# Patient Record
Sex: Male | Born: 1940 | Race: White | Hispanic: No | Marital: Married | Smoking: Never smoker
Health system: Southern US, Community
[De-identification: ages and names within clinical notes are randomized; demographics above are authoritative.]

## PROBLEM LIST (undated history)

## (undated) DIAGNOSIS — E119 Type 2 diabetes mellitus without complications: Secondary | ICD-10-CM

## (undated) DIAGNOSIS — G2581 Restless legs syndrome: Secondary | ICD-10-CM

## (undated) DIAGNOSIS — G4730 Sleep apnea, unspecified: Secondary | ICD-10-CM

## (undated) DIAGNOSIS — R600 Localized edema: Secondary | ICD-10-CM

## (undated) DIAGNOSIS — C61 Malignant neoplasm of prostate: Secondary | ICD-10-CM

## (undated) DIAGNOSIS — Z8719 Personal history of other diseases of the digestive system: Secondary | ICD-10-CM

## (undated) DIAGNOSIS — I4891 Unspecified atrial fibrillation: Secondary | ICD-10-CM

## (undated) DIAGNOSIS — K219 Gastro-esophageal reflux disease without esophagitis: Secondary | ICD-10-CM

## (undated) DIAGNOSIS — T4145XA Adverse effect of unspecified anesthetic, initial encounter: Secondary | ICD-10-CM

## (undated) DIAGNOSIS — T8859XA Other complications of anesthesia, initial encounter: Secondary | ICD-10-CM

## (undated) DIAGNOSIS — R519 Headache, unspecified: Secondary | ICD-10-CM

## (undated) DIAGNOSIS — I499 Cardiac arrhythmia, unspecified: Secondary | ICD-10-CM

## (undated) DIAGNOSIS — I10 Essential (primary) hypertension: Secondary | ICD-10-CM

## (undated) DIAGNOSIS — M75100 Unspecified rotator cuff tear or rupture of unspecified shoulder, not specified as traumatic: Secondary | ICD-10-CM

## (undated) DIAGNOSIS — M1990 Unspecified osteoarthritis, unspecified site: Secondary | ICD-10-CM

## (undated) DIAGNOSIS — R51 Headache: Secondary | ICD-10-CM

## (undated) HISTORY — PX: NECK SURGERY: SHX720

## (undated) HISTORY — PX: JOINT REPLACEMENT: SHX530

## (undated) HISTORY — PX: PROSTATE SURGERY: SHX751

## (undated) HISTORY — PX: KNEE SURGERY: SHX244

## (undated) HISTORY — PX: ROTATOR CUFF REPAIR: SHX139

## (undated) HISTORY — PX: MANDIBLE FRACTURE SURGERY: SHX706

---

## 2003-09-22 IMAGING — CR DG CHEST 2V
2 series · 2 of 2 positions shown · non-contrast
Comparison: none

CLINICAL DATA: Preadmission respiratory evaluation.  Patient for cervical surgery.
 CHEST, TWO VIEWS
 PA and lateral views of the chest dated [DATE] are reviewed without prior films for comparison.  The heart is upper limits of normal in size. The pulmonary vascularity is within normal limits and the lung fields are clear.  Eventration of the right hemidiaphragm is noted. 
 IMPRESSION
 Negative chest for active disease.

[view not recorded (1 of 2)]
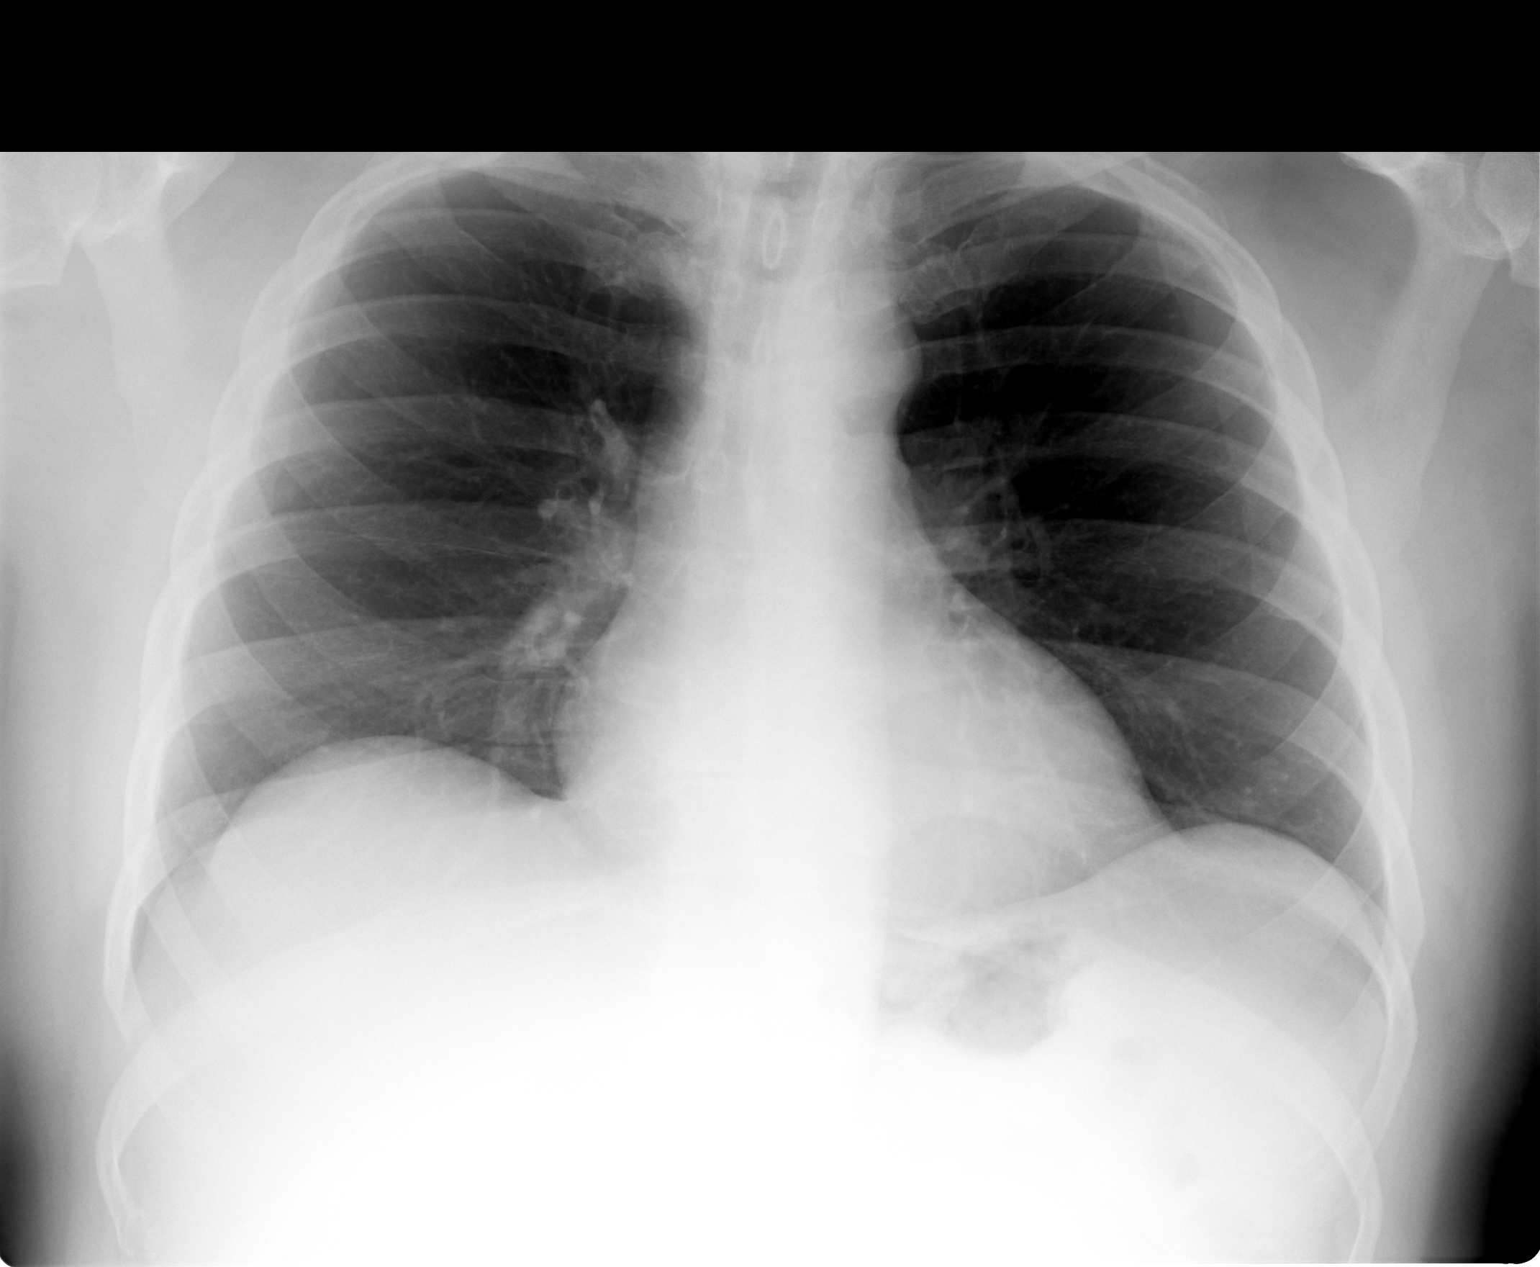

[view not recorded (2 of 2)]
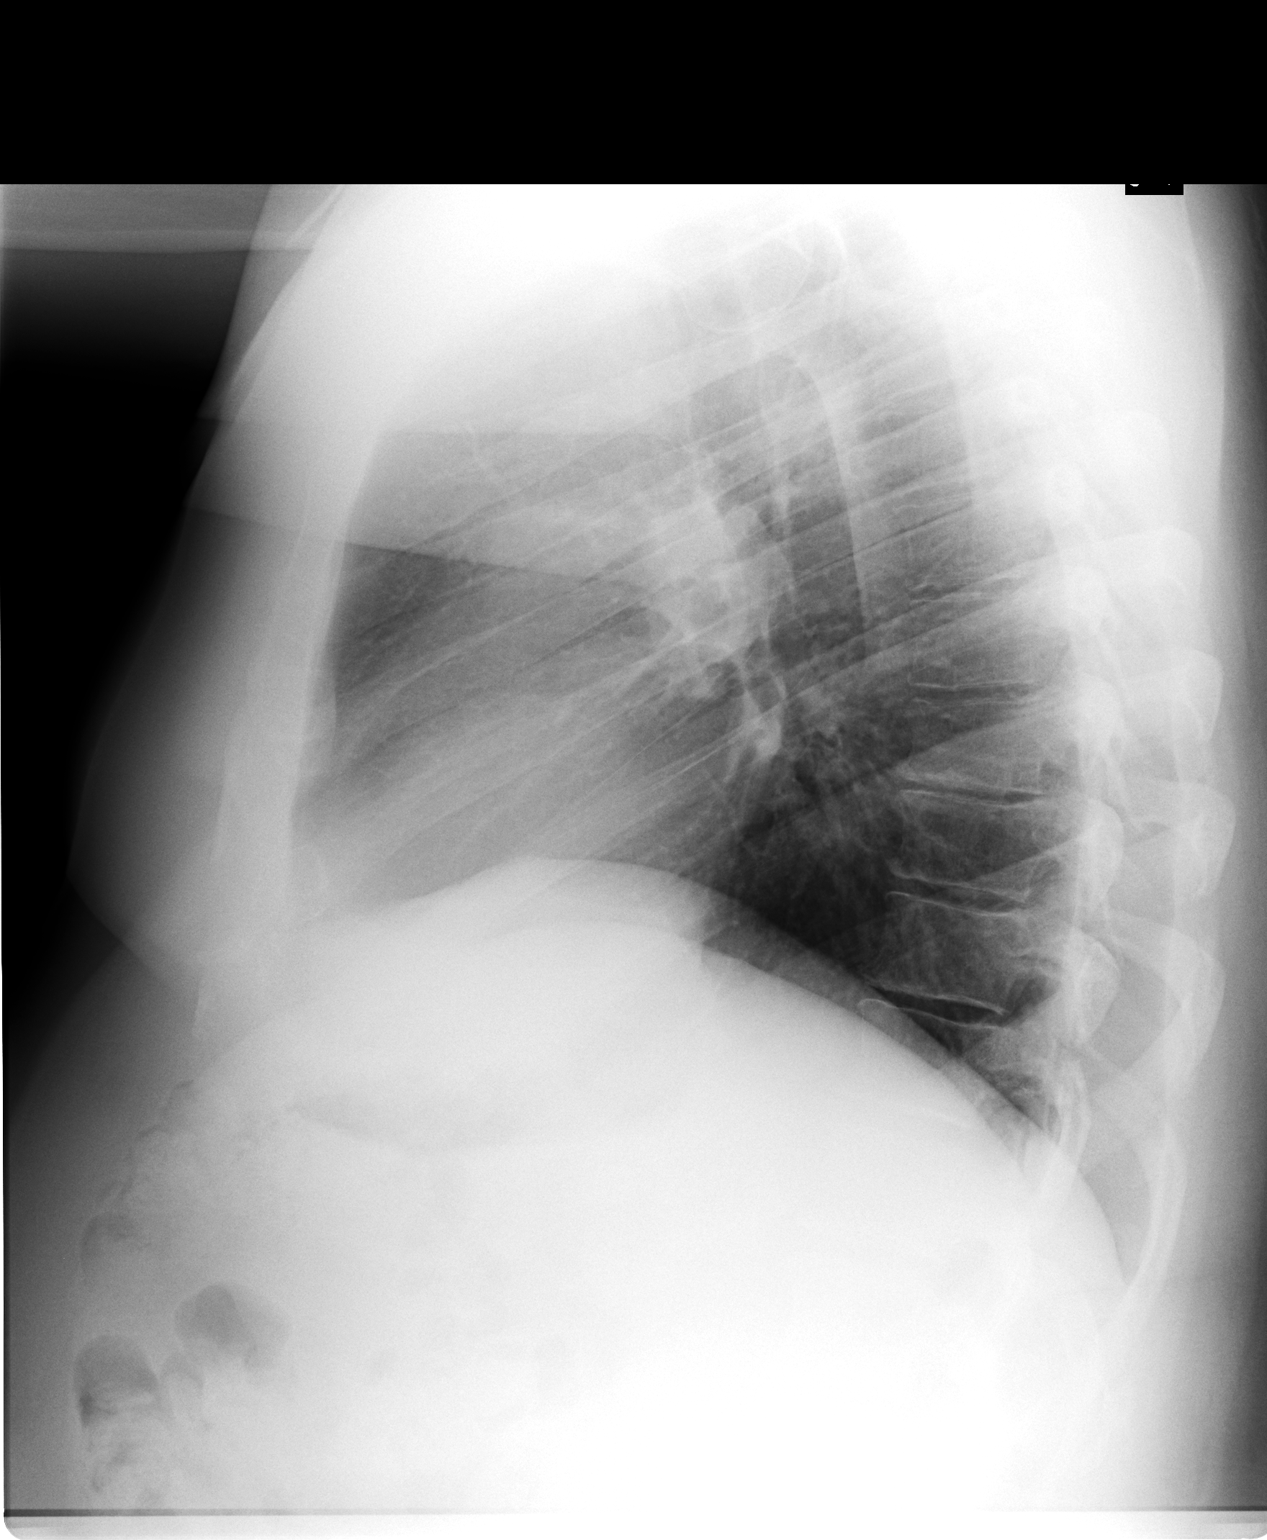

[2 of 2 positions shown; findings below may reference images not displayed]

## 2003-09-24 ENCOUNTER — Inpatient Hospital Stay (HOSPITAL_COMMUNITY): Admission: RE | Admit: 2003-09-24 | Discharge: 2003-09-25 | Payer: Self-pay | Admitting: Orthopaedic Surgery

## 2003-09-25 IMAGING — CR DG CERVICAL SPINE 2 OR 3 VIEWS
2 series · 2 of 2 positions shown · non-contrast
Comparison: Intraoperative films dated [DATE].

CLINICAL DATA: Status-post cervical discectomy and fusion. 
 TWO VIEW CERVICAL SPINE FILMS

[view not recorded (1 of 2)]
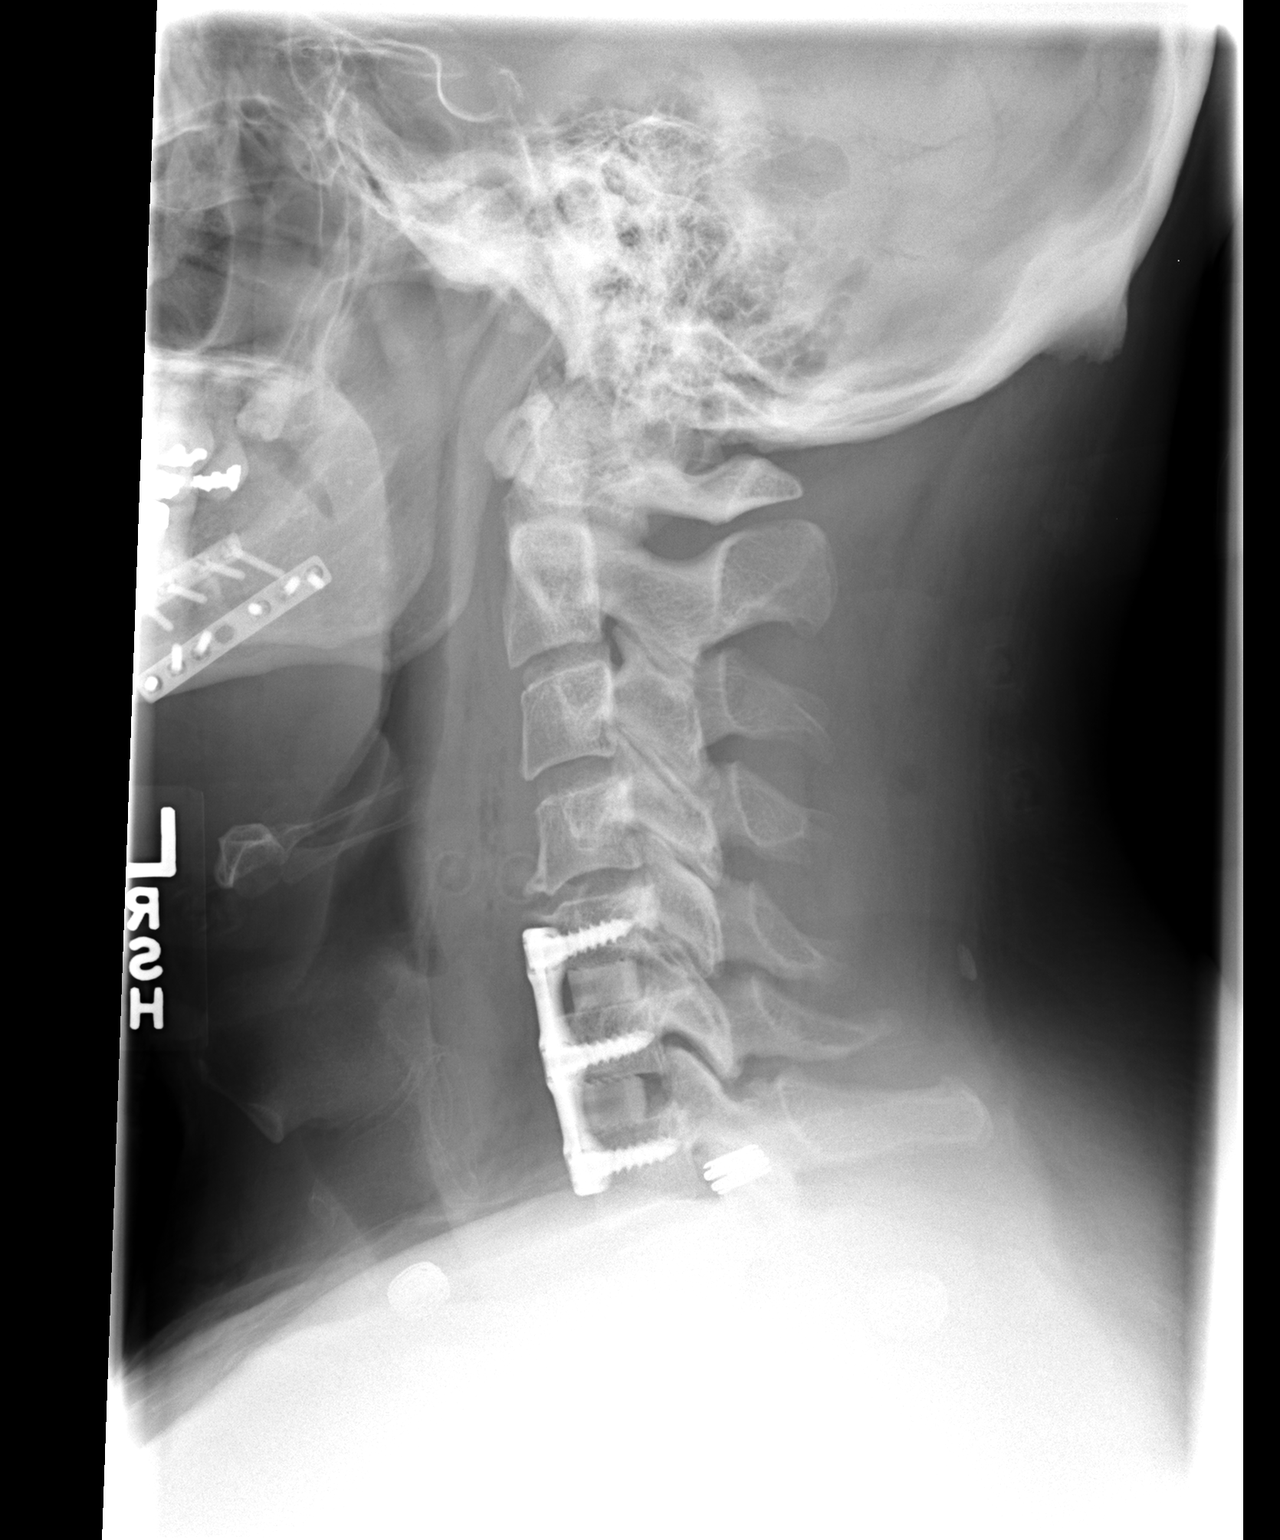

[view not recorded (2 of 2)]
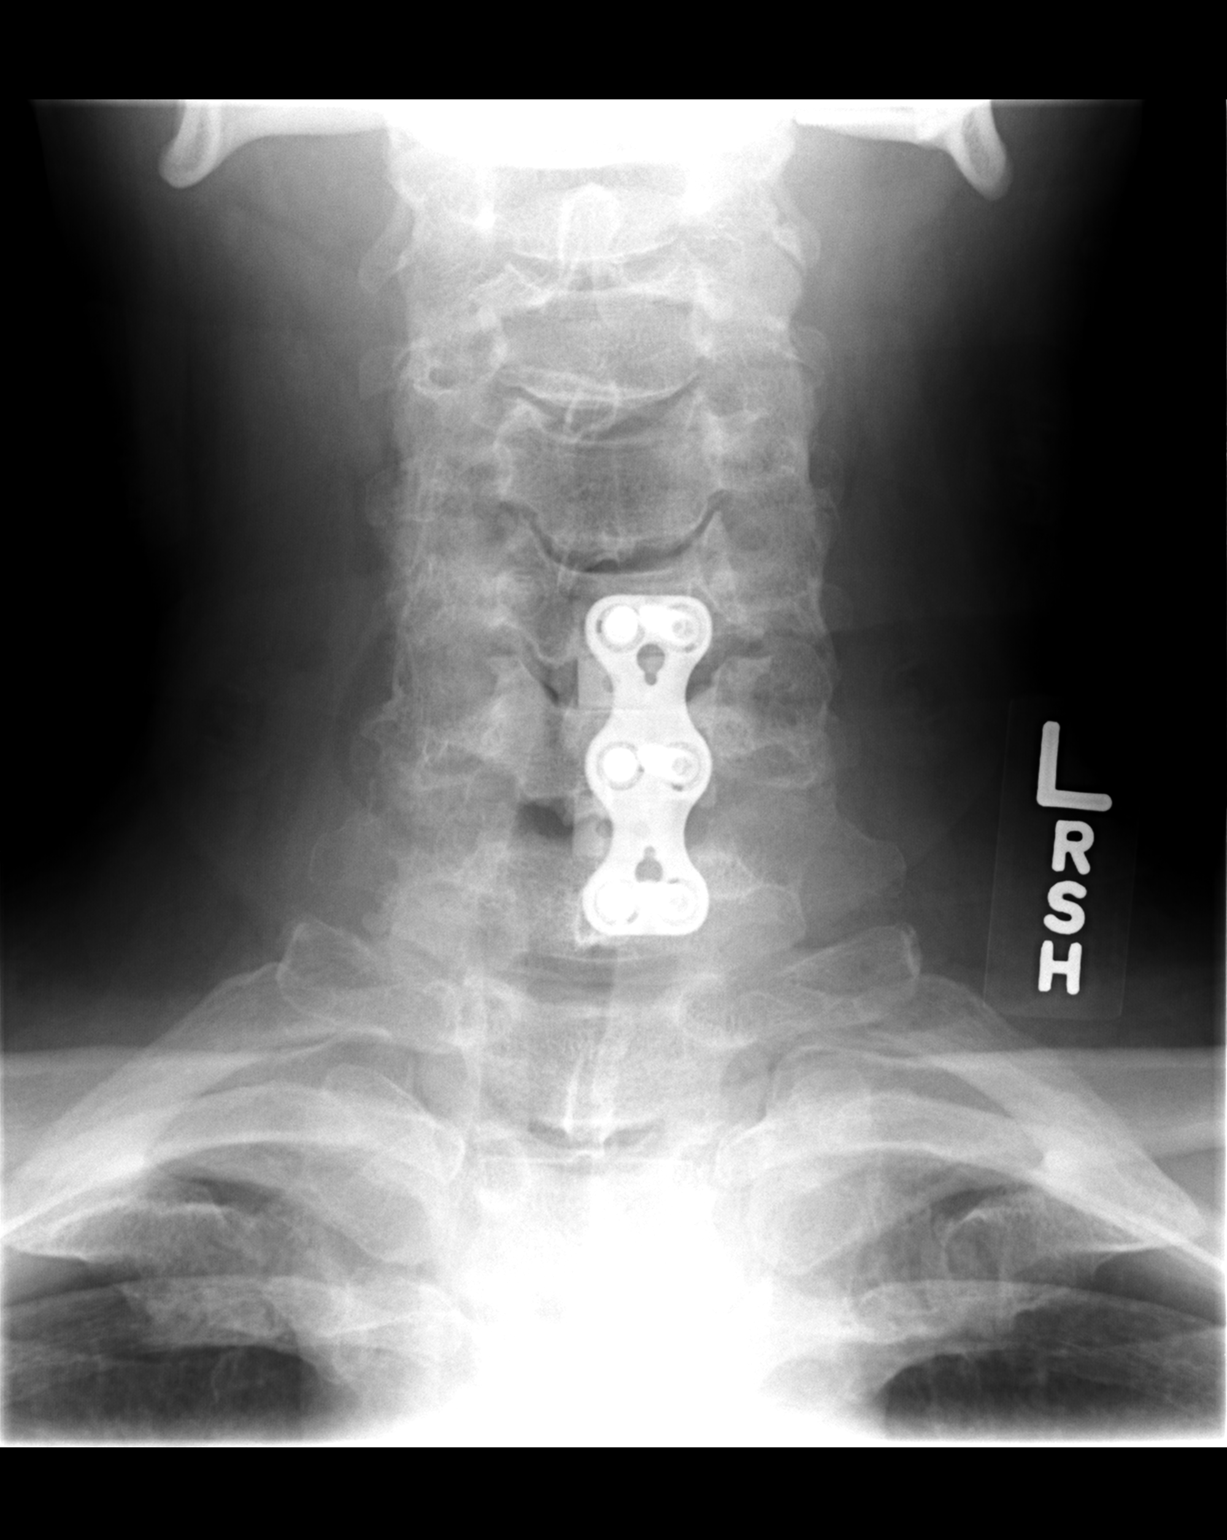

[2 of 2 positions shown; findings below may reference images not displayed]

Frontal and lateral projections show the patient to be status-post anterior cervical discectomy and fusion spanning from the C-5 to C-7 levels. Alignment of the fusion hardware and intervening bony allograft is near anatomic.  Mild anterior soft tissue swelling is present post-operatively.
 IMPRESSION
 Normal alignment following ACDF from C-5 to C-7.

## 2005-07-18 DIAGNOSIS — C61 Malignant neoplasm of prostate: Secondary | ICD-10-CM

## 2005-07-18 HISTORY — DX: Malignant neoplasm of prostate: C61

## 2006-05-04 ENCOUNTER — Encounter
Admission: RE | Admit: 2006-05-04 | Discharge: 2006-05-04 | Payer: Self-pay | Admitting: Physical Medicine and Rehabilitation

## 2006-08-22 ENCOUNTER — Encounter: Admission: RE | Admit: 2006-08-22 | Discharge: 2006-11-20 | Payer: Self-pay | Admitting: Orthopedic Surgery

## 2007-07-19 HISTORY — PX: OTHER SURGICAL HISTORY: SHX169

## 2009-11-16 ENCOUNTER — Inpatient Hospital Stay (HOSPITAL_COMMUNITY): Admission: RE | Admit: 2009-11-16 | Discharge: 2009-11-19 | Payer: Self-pay | Admitting: Orthopedic Surgery

## 2009-11-16 IMAGING — CR DG KNEE 1-2V PORT*L*
2 series · 2 of 2 positions shown · non-contrast
Comparison: None.

CLINICAL DATA: Postop left total knee replacement

PORTABLE LEFT KNEE - 1-2 VIEW

[view not recorded (1 of 2)]
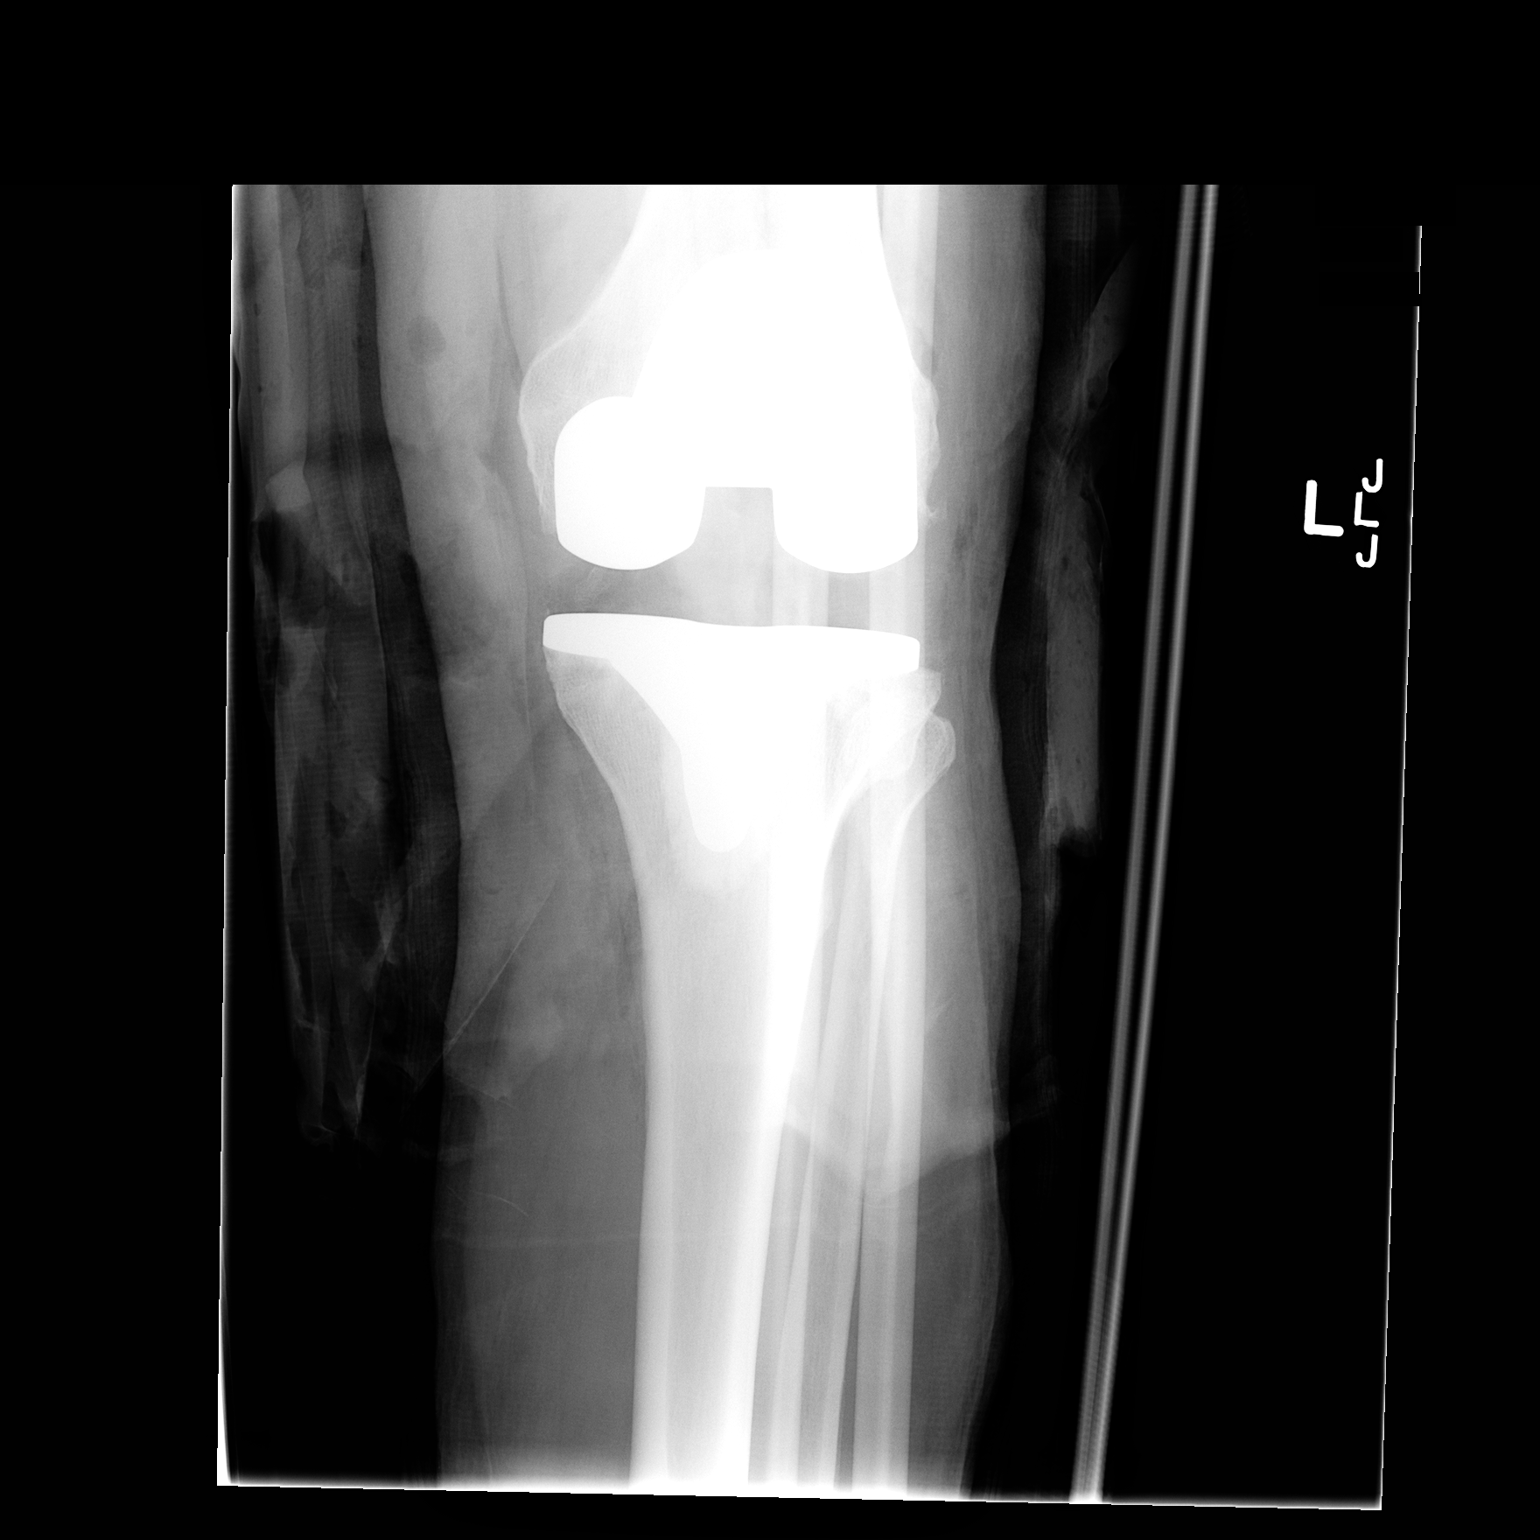

[view not recorded (2 of 2)]
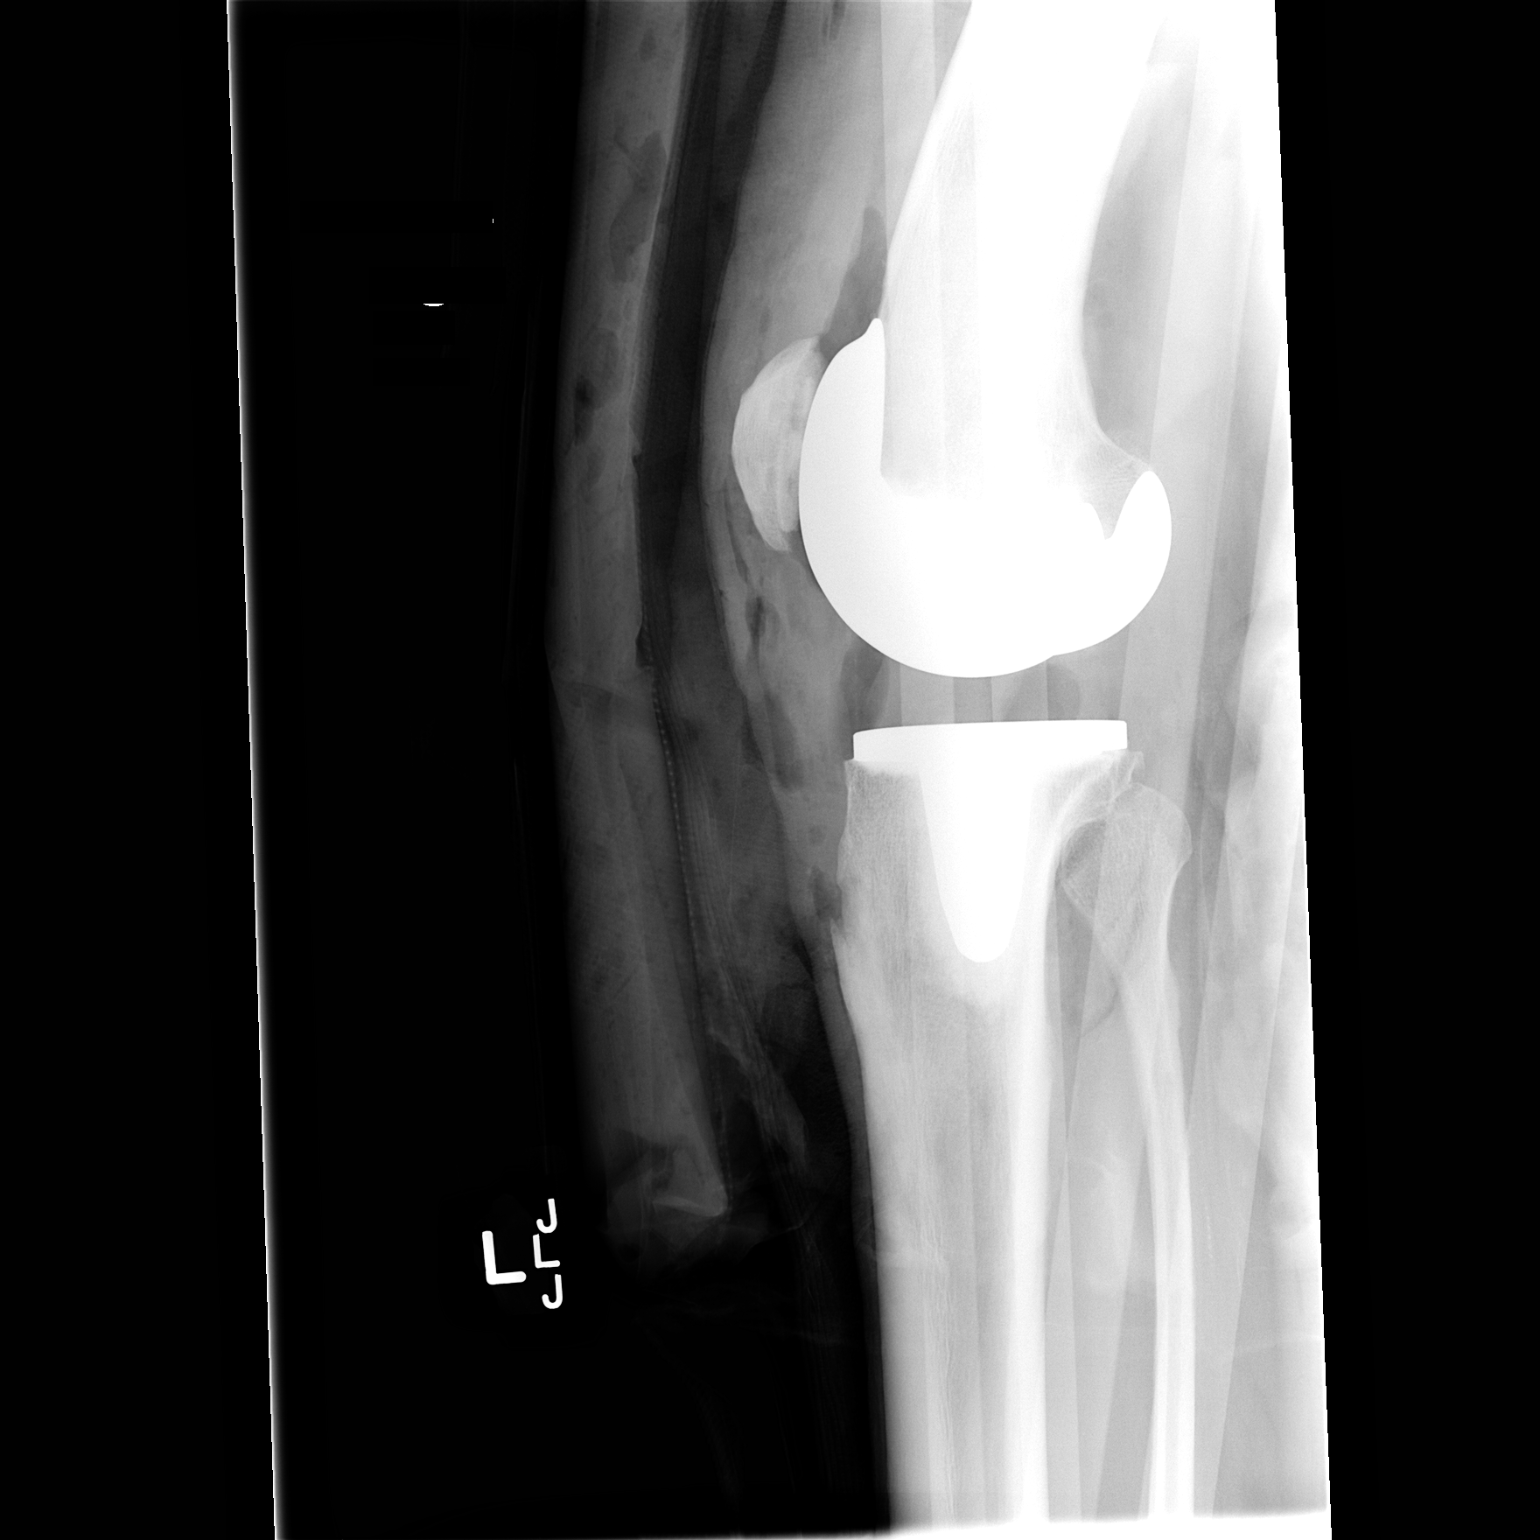

[2 of 2 positions shown; findings below may reference images not displayed]

FINDINGS: Two portable views of the left knee show the femoral and
tibial components of the total knee replacement to be in good
position and alignment.  Some air is noted in the soft tissues and
in the joint space postoperatively.
IMPRESSION: Left total knee replacement in good position and alignment on
portable views.

## 2010-10-05 LAB — CBC
HCT: 33.1 % — ABNORMAL LOW (ref 39.0–52.0)
HCT: 36.2 % — ABNORMAL LOW (ref 39.0–52.0)
Hemoglobin: 10.3 g/dL — ABNORMAL LOW (ref 13.0–17.0)
Hemoglobin: 11.4 g/dL — ABNORMAL LOW (ref 13.0–17.0)
Hemoglobin: 12.8 g/dL — ABNORMAL LOW (ref 13.0–17.0)
MCHC: 34.4 g/dL (ref 30.0–36.0)
MCV: 95.2 fL (ref 78.0–100.0)
Platelets: 165 10*3/uL (ref 150–400)
Platelets: 210 10*3/uL (ref 150–400)
RBC: 3.8 MIL/uL — ABNORMAL LOW (ref 4.22–5.81)
RBC: 4.51 MIL/uL (ref 4.22–5.81)
RDW: 13.2 % (ref 11.5–15.5)
RDW: 13.5 % (ref 11.5–15.5)
WBC: 15.9 10*3/uL — ABNORMAL HIGH (ref 4.0–10.5)
WBC: 7.9 10*3/uL (ref 4.0–10.5)

## 2010-10-05 LAB — BASIC METABOLIC PANEL
BUN: 12 mg/dL (ref 6–23)
BUN: 14 mg/dL (ref 6–23)
CO2: 28 mEq/L (ref 19–32)
Calcium: 7.9 mg/dL — ABNORMAL LOW (ref 8.4–10.5)
Calcium: 8.6 mg/dL (ref 8.4–10.5)
Chloride: 103 mEq/L (ref 96–112)
Chloride: 96 mEq/L (ref 96–112)
Chloride: 106 mEq/L (ref 96–112)
Creatinine, Ser: 0.84 mg/dL (ref 0.4–1.5)
Creatinine, Ser: 1.2 mg/dL (ref 0.4–1.5)
GFR calc Af Amer: >60 mL/min (ref >60)
GFR calc Af Amer: >60 mL/min (ref >60)
GFR calc non Af Amer: >60 mL/min (ref >60)
GFR calc non Af Amer: >60 mL/min (ref >60)
Glucose, Bld: 135 mg/dL — ABNORMAL HIGH (ref 70–99)
Glucose, Bld: 156 mg/dL — ABNORMAL HIGH (ref 70–99)
Potassium: 4.1 mEq/L (ref 3.5–5.1)
Potassium: 4.3 mEq/L (ref 3.5–5.1)
Sodium: 130 mEq/L — ABNORMAL LOW (ref 135–145)
Sodium: 134 mEq/L — ABNORMAL LOW (ref 135–145)

## 2010-10-05 LAB — URINALYSIS, ROUTINE W REFLEX MICROSCOPIC
Bilirubin Urine: NEGATIVE
Bilirubin Urine: NEGATIVE
Glucose, UA: 250 mg/dL — AB
Glucose, UA: NEGATIVE mg/dL
Hgb urine dipstick: NEGATIVE
Ketones, ur: NEGATIVE mg/dL
Leukocytes, UA: NEGATIVE
Specific Gravity, Urine: 1.03 (ref 1.005–1.030)
Specific Gravity, Urine: 1.015 (ref 1.005–1.030)
pH: 6.5 (ref 5.0–8.0)
pH: 6 (ref 5.0–8.0)

## 2010-10-05 LAB — APTT: aPTT: 28 seconds (ref 24–37)

## 2010-10-05 LAB — URINE MICROSCOPIC-ADD ON

## 2010-10-05 LAB — DIFFERENTIAL
Lymphocytes Relative: 6 % — ABNORMAL LOW (ref 12–46)
Lymphs Abs: 0.5 10*3/uL — ABNORMAL LOW (ref 0.7–4.0)
Monocytes Relative: 2 % — ABNORMAL LOW (ref 3–12)
Neutro Abs: 7.3 10*3/uL (ref 1.7–7.7)
Neutrophils Relative %: 92 % — ABNORMAL HIGH (ref 43–77)

## 2010-10-05 LAB — PROTIME-INR
INR: 3.51 — ABNORMAL HIGH (ref 0.00–1.49)
INR: 1.03 (ref 0.00–1.49)
Prothrombin Time: 34.9 seconds — ABNORMAL HIGH (ref 11.6–15.2)
Prothrombin Time: 13.4 seconds (ref 11.6–15.2)

## 2010-10-05 LAB — ABO/RH: ABO/RH(D): A POS

## 2010-12-03 NOTE — Op Note (Signed)
NAME:  Manuel Hunter, Manuel Hunter NO.:  [id number]   MEDICAL RECORD NO.:  [id number]                   PATIENT TYPE:  INP   LOCATION:  5727                                 FACILITY:  [facility]   PHYSICIAN:  Sharolyn Douglas, M.D.                     DATE OF BIRTH:  Aug 07, 1940   DATE OF PROCEDURE:  09/24/2003  DATE OF DISCHARGE:                                 OPERATIVE REPORT   PREOPERATIVE DIAGNOSIS:  Cervical spondylotic radiculopathy.   POSTOPERATIVE DIAGNOSIS:  Cervical spondylotic radiculopathy.   PROCEDURE:  1. Anterior cervical discectomy C5-C6 and C6-C7.  2. Anterior cervical arthrodesis C5-C6 and C6-C7 with placement of two     allograft prosthesis spacers packed with local autogenous bone graft.  3. Anterior cervical plating C5 through C7 utilizing the Spinal Concepts     System.   SURGEON:  Sharolyn Douglas, M.D.   ASSISTANT:  Verlin Fester, P.A.   ANESTHESIA:  General endotracheal anesthesia.   COMPLICATIONS:  None.   INDICATIONS FOR PROCEDURE:  The patient is a 70 year old male who has a long  history of neck stiffness and intermittent pain.  Two months ago, he had a  fall.  He developed radicular pain of his left upper extremity parascapular  area with numbness, tingling and weakness.  Plain radiographs showed  degenerative changes at C4-C5, C5-C6, and C6-C7.  MRI scan demonstrated  foraminal narrowing at C5-C6 and C6-C7 secondary to broad based disc  protrusions as well as osteophytes.  Because of the symptoms unresponsive to  conservative care, he elected to undergo decompression with anterior  cervical discectomy and fusion at C5-C6 and C6-C7.  The risks, benefits, and  alternatives were extensively discussed and the patient elected to proceed.   PROCEDURE:  The patient was properly identified in the holding area and  taken to the operating room.  He underwent general endotracheal anesthesia  without difficulty.  He was given prophylactic IV antibiotics.   He was  carefully positioned on the operating table with a Mayfield headrest.  His  neck was placed in slight extension.  The neck was prepped and draped in the  usual sterile fashion.  A 4 cm incision was made on the left side of the  neck in a transverse fashion at the level of the cricoid cartilage.  Dissection was carried sharply through the platysma.  The interval between  the sternocleidomastoid muscle and the strap muscles medially was developed  down to the prevertebral space.  The esophagus, trachea, and carotid sheath  were identified and protected at all times.  The C4-C5 and C5-C6 disc spaces  were easily identifiable by the anterior osteophytes.  A spinal needle was  placed in the C6-C7 space and an x-ray taken to confirm this location.  The  longus colli muscle was then elevated bilaterally out over the C5-C6 and C6-  C7 disc spaces.  Deep retractor  was placed.  The microscope was draped and  brought into the field.  Over hanging anterior osteophytes were removed with  the Leksell rongeur.  Discectomies were carried out at C5-C6 and C6-C7.  The  disc space was severely degenerative and collapsed at C5-C6.  Caspar  distraction pins were placed in order to gain access to the interspace.  A  high speed bur was used to remove the cartilaginous endplates and take down  the large uncovertebral osteophytes.  Foraminotomy was carried out  bilaterally.  The posterior longitudinal ligament was taken down completely  decompressing the spinal cord and nerve roots.  A similar procedure was  carried out at C6-C7.  We then placed 8 mm allograft prosthesis spacer  packed with local autogenous bone graft obtained from the bur shavings into  the C6-C7 interspace.  It was carefully counter sunk 1 mm.  A 7 mm graft was  utilized at C5-C6.  We then placed a 42 mm Spinal Concepts anterior cervical  plate with six 14 mm screws.  We got excellent screw purchase.  We insured  that the locking  mechanism engaged for each screw.  The wound was irrigated.  The esophagus, trachea, and carotid sheath were inspected and there were no  apparent injuries.  We placed a deep Penrose drain.  The platysma was closed  with an interrupted 2-0 Vicryl suture.  The subcutaneous layer closed with a  3-0 Vicryl followed by a 4-0 subcuticular Vicryl suture on the skin.  Benzoin and Steri-Strips were placed.  A sterile dressing was placed.  The  patient was placed into his Aspen cervical collar, extubated without  difficulty, transferred to the recovery room, able to move his upper and  lower extremities, in good condition.                                               Sharolyn Douglas, M.D.    MC/MEDQ  D:  09/25/2003  T:  09/25/2003  Job:  [id number]

## 2011-10-27 ENCOUNTER — Other Ambulatory Visit (HOSPITAL_COMMUNITY): Payer: Self-pay | Admitting: Orthopedic Surgery

## 2011-10-27 DIAGNOSIS — M25562 Pain in left knee: Secondary | ICD-10-CM

## 2011-11-04 ENCOUNTER — Encounter (HOSPITAL_COMMUNITY): Payer: Self-pay

## 2011-11-04 ENCOUNTER — Other Ambulatory Visit (HOSPITAL_COMMUNITY): Payer: Self-pay

## 2011-11-11 ENCOUNTER — Encounter (HOSPITAL_COMMUNITY): Payer: Self-pay

## 2011-11-11 ENCOUNTER — Encounter (HOSPITAL_COMMUNITY)
Admission: RE | Admit: 2011-11-11 | Discharge: 2011-11-11 | Disposition: A | Discharge status: Home / Self Care | Payer: Medicare Other | Source: Ambulatory Visit | Attending: Orthopedic Surgery | Admitting: Orthopedic Surgery

## 2011-11-11 DIAGNOSIS — M25562 Pain in left knee: Secondary | ICD-10-CM

## 2011-11-11 DIAGNOSIS — M25469 Effusion, unspecified knee: Secondary | ICD-10-CM | POA: Insufficient documentation

## 2011-11-11 DIAGNOSIS — Z96659 Presence of unspecified artificial knee joint: Secondary | ICD-10-CM | POA: Insufficient documentation

## 2011-11-11 DIAGNOSIS — M25569 Pain in unspecified knee: Secondary | ICD-10-CM | POA: Insufficient documentation

## 2011-11-11 IMAGING — NM NM BONE 3 PHASE
2 series · 12 of 12 positions shown · non-contrast
Comparison: Radiographs from [REDACTED]
[DATE].

CLINICAL DATA: Left knee pain and swelling status post total knee
arthroplasty in [II].

NUCLEAR MEDICINE THREE PHASE BONE SCAN
TECHNIQUE: Radionuclide angiographic images, immediate static
blood pool images, and 3-hour delayed static images the knees were
obtained after intravenous injection of radiopharmaceutical.
Radiopharmaceutical: [II] ABRAHAMS TECHNETIUM TC 99M
MEDRONATE IV KIT

[Series 1: fl flow and static · 4.75mm/px · 6 of 40 frames shown (1 of 2)]
[frame 4/40]
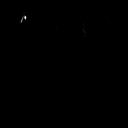
[frame 10/40  full-range]
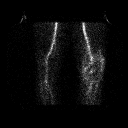
[frame 17/40  full-range]
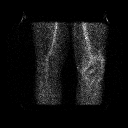
[frame 24/40  full-range]
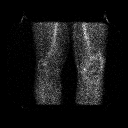
[frame 30/40  full-range]
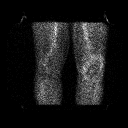
[frame 37/40  full-range]
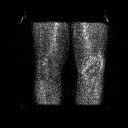

[Series 1: fl flow and static · 4.75mm/px · 6 of 40 frames shown (2 of 2)]
[frame 4/40]
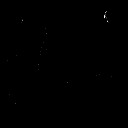
[frame 10/40  full-range]
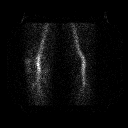
[frame 17/40  full-range]
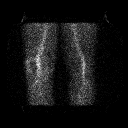
[frame 24/40  full-range]
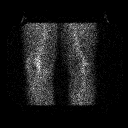
[frame 30/40  full-range]
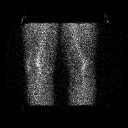
[frame 37/40  full-range]
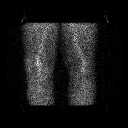

[12 of 12 positions shown; findings below may reference images not displayed]

FINDINGS: The dynamic images demonstrate minimally asymmetric
increased perfusion to the left knee.  On the blood pool images,
there is mildly increased activity surrounding the left total knee
arthroplasty, primarily the femoral component.  On the delayed
phase images, there is low-level activity surrounding the femoral,
tibial and patellar components of the left total knee arthroplasty.
There is also mild degenerative activity involving the right
patella.
IMPRESSION: 1.  Mild asymmetric increased perfusion and blood pool activity
surrounding the left total knee arthroplasty.
2.  The delayed phase activity is only mildly increased.  This
pattern is nonspecific and may represent synovitis. Early loosening
cannot be excluded.

## 2011-11-11 MED ORDER — TECHNETIUM TC 99M MEDRONATE IV KIT
24.0000 | PACK | Freq: Once | INTRAVENOUS | Status: AC | PRN
  Administered 2011-11-11: 24 via INTRAVENOUS

## 2014-06-12 ENCOUNTER — Emergency Department (HOSPITAL_BASED_OUTPATIENT_CLINIC_OR_DEPARTMENT_OTHER)
Admission: EM | Admit: 2014-06-12 | Discharge: 2014-06-12 | Disposition: A | Discharge status: Other Acute Inpatient Hospital | Payer: Medicare HMO | Attending: Emergency Medicine | Admitting: Emergency Medicine

## 2014-06-12 ENCOUNTER — Emergency Department (HOSPITAL_BASED_OUTPATIENT_CLINIC_OR_DEPARTMENT_OTHER): Payer: Medicare HMO

## 2014-06-12 ENCOUNTER — Encounter (HOSPITAL_BASED_OUTPATIENT_CLINIC_OR_DEPARTMENT_OTHER): Payer: Self-pay

## 2014-06-12 DIAGNOSIS — Z8669 Personal history of other diseases of the nervous system and sense organs: Secondary | ICD-10-CM | POA: Insufficient documentation

## 2014-06-12 DIAGNOSIS — Z79899 Other long term (current) drug therapy: Secondary | ICD-10-CM | POA: Insufficient documentation

## 2014-06-12 DIAGNOSIS — R4182 Altered mental status, unspecified: Secondary | ICD-10-CM

## 2014-06-12 DIAGNOSIS — Z8546 Personal history of malignant neoplasm of prostate: Secondary | ICD-10-CM | POA: Insufficient documentation

## 2014-06-12 DIAGNOSIS — R270 Ataxia, unspecified: Secondary | ICD-10-CM

## 2014-06-12 DIAGNOSIS — R260 Ataxic gait: Secondary | ICD-10-CM | POA: Diagnosis not present

## 2014-06-12 DIAGNOSIS — M1990 Unspecified osteoarthritis, unspecified site: Secondary | ICD-10-CM | POA: Insufficient documentation

## 2014-06-12 HISTORY — DX: Unspecified osteoarthritis, unspecified site: M19.90

## 2014-06-12 HISTORY — DX: Malignant neoplasm of prostate: C61

## 2014-06-12 HISTORY — DX: Restless legs syndrome: G25.81

## 2014-06-12 LAB — COMPREHENSIVE METABOLIC PANEL
ALBUMIN: 3.8 g/dL (ref 3.5–5.2)
ALT: 15 U/L (ref 0–53)
ANION GAP: 11 (ref 5–15)
AST: 22 U/L (ref 0–37)
Alkaline Phosphatase: 89 U/L (ref 39–117)
BILIRUBIN TOTAL: 0.2 mg/dL — AB (ref 0.3–1.2)
BUN: 20 mg/dL (ref 6–23)
CHLORIDE: 105 meq/L (ref 96–112)
CO2: 25 mEq/L (ref 19–32)
CREATININE: 1.2 mg/dL (ref 0.50–1.35)
Calcium: 9 mg/dL (ref 8.4–10.5)
GFR calc Af Amer: 67 mL/min — ABNORMAL LOW (ref >90)
GFR, EST NON AFRICAN AMERICAN: 58 mL/min — AB (ref >90)
Glucose, Bld: 167 mg/dL — ABNORMAL HIGH (ref 70–99)
Potassium: 4 mEq/L (ref 3.7–5.3)
Sodium: 141 mEq/L (ref 137–147)
Total Protein: 6.7 g/dL (ref 6.0–8.3)

## 2014-06-12 LAB — APTT: aPTT: 30 seconds (ref 24–37)

## 2014-06-12 LAB — PHENYTOIN LEVEL, TOTAL: PHENYTOIN LVL: 3.1 ug/mL — AB (ref 10.0–20.0)

## 2014-06-12 LAB — URINALYSIS, ROUTINE W REFLEX MICROSCOPIC
Bilirubin Urine: NEGATIVE
GLUCOSE, UA: NEGATIVE mg/dL
HGB URINE DIPSTICK: NEGATIVE
Ketones, ur: NEGATIVE mg/dL
Leukocytes, UA: NEGATIVE
Nitrite: NEGATIVE
Protein, ur: NEGATIVE mg/dL
SPECIFIC GRAVITY, URINE: 1.008 (ref 1.005–1.030)
Urobilinogen, UA: 0.2 mg/dL (ref 0.0–1.0)
pH: 5.5 (ref 5.0–8.0)

## 2014-06-12 LAB — CBC
HEMATOCRIT: 39.8 % (ref 39.0–52.0)
HEMOGLOBIN: 13.7 g/dL (ref 13.0–17.0)
MCH: 31.8 pg (ref 26.0–34.0)
MCHC: 34.4 g/dL (ref 30.0–36.0)
MCV: 92.3 fL (ref 78.0–100.0)
Platelets: 207 10*3/uL (ref 150–400)
RBC: 4.31 MIL/uL (ref 4.22–5.81)
RDW: 13.1 % (ref 11.5–15.5)
WBC: 6.2 10*3/uL (ref 4.0–10.5)

## 2014-06-12 LAB — PROTIME-INR
INR: 1.06 (ref 0.00–1.49)
Prothrombin Time: 13.8 seconds (ref 11.6–15.2)

## 2014-06-12 LAB — RAPID URINE DRUG SCREEN, HOSP PERFORMED
AMPHETAMINES: NOT DETECTED
Barbiturates: NOT DETECTED
Benzodiazepines: NOT DETECTED
Cocaine: NOT DETECTED
OPIATES: NOT DETECTED
Tetrahydrocannabinol: NOT DETECTED

## 2014-06-12 LAB — DIFFERENTIAL
BASOS PCT: 0 % (ref 0–1)
Basophils Absolute: 0 10*3/uL (ref 0.0–0.1)
EOS ABS: 0.3 10*3/uL (ref 0.0–0.7)
EOS PCT: 5 % (ref 0–5)
Lymphocytes Relative: 17 % (ref 12–46)
Lymphs Abs: 1.1 10*3/uL (ref 0.7–4.0)
MONO ABS: 0.6 10*3/uL (ref 0.1–1.0)
MONOS PCT: 10 % (ref 3–12)
NEUTROS ABS: 4.2 10*3/uL (ref 1.7–7.7)
Neutrophils Relative %: 68 % (ref 43–77)

## 2014-06-12 LAB — I-STAT ARTERIAL BLOOD GAS, ED
Acid-base deficit: 1 mmol/L (ref 0.0–2.0)
BICARBONATE: 25.3 meq/L — AB (ref 20.0–24.0)
O2 SAT: 93 %
PO2 ART: 69 mmHg — AB (ref 80.0–100.0)
Patient temperature: 37
TCO2: 27 mmol/L (ref 0–100)
pCO2 arterial: 45.9 mmHg — ABNORMAL HIGH (ref 35.0–45.0)
pH, Arterial: 7.349 — ABNORMAL LOW (ref 7.350–7.450)

## 2014-06-12 LAB — TROPONIN I

## 2014-06-12 LAB — ETHANOL: Alcohol, Ethyl (B): <11 mg/dL (ref 0–11)

## 2014-06-12 IMAGING — CT CT HEAD W/O CM
2 series · 16 of 30 positions shown, 18 images · non-contrast
Comparison: None.

CLINICAL DATA: Altered mental status and dizziness

EXAM:
CT HEAD WITHOUT CONTRAST
TECHNIQUE: Contiguous axial images were obtained from the base of the skull
through the vertex without intravenous contrast.

[Series 2: head 4.8 h37s · axial · 0.44mm/px · z∈[-130,+3]mm · 8 of 36 slices shown, 10 images]
[im 4/36  brain]
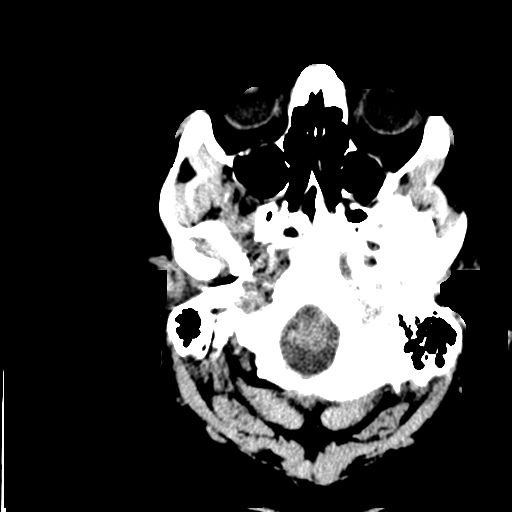
[im 4/36  bone]
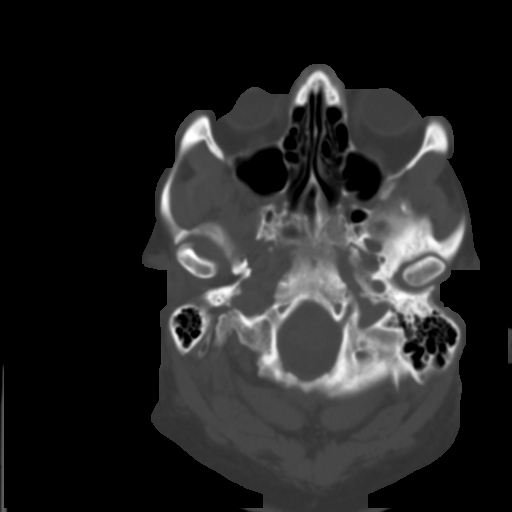
[im 8/36  brain]
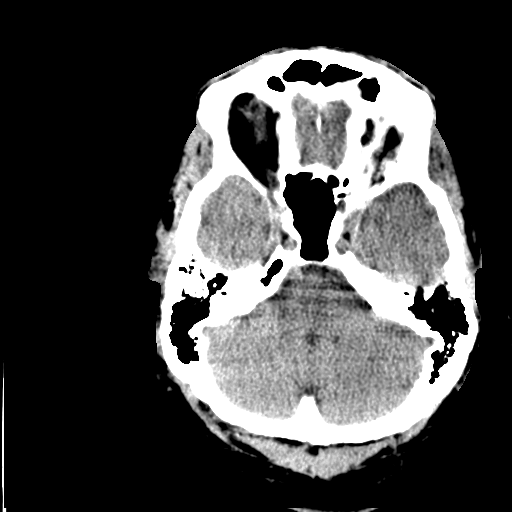
[im 12/36  brain]
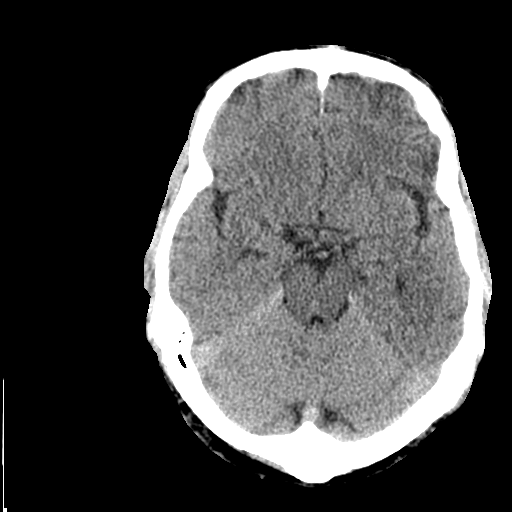
[im 16/36  brain]
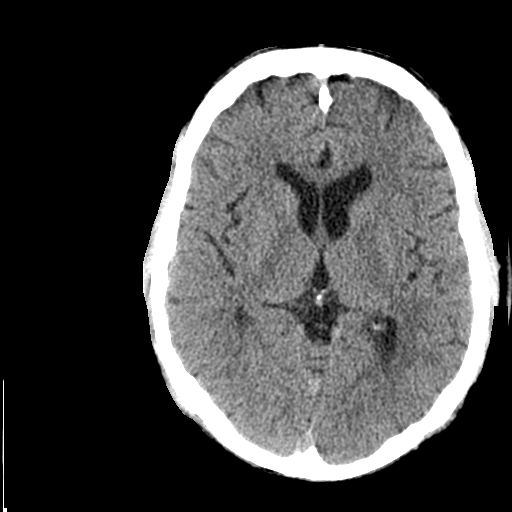
[im 20/36  brain]
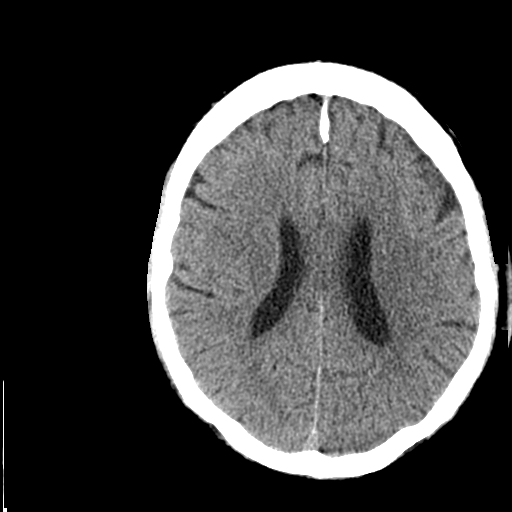
[im 20/36  bone]
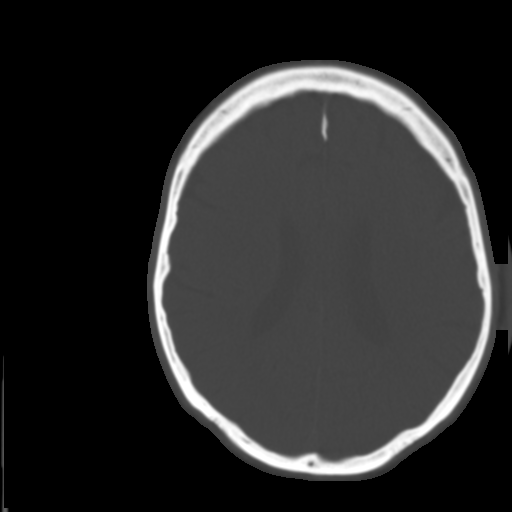
[im 24/36  brain]
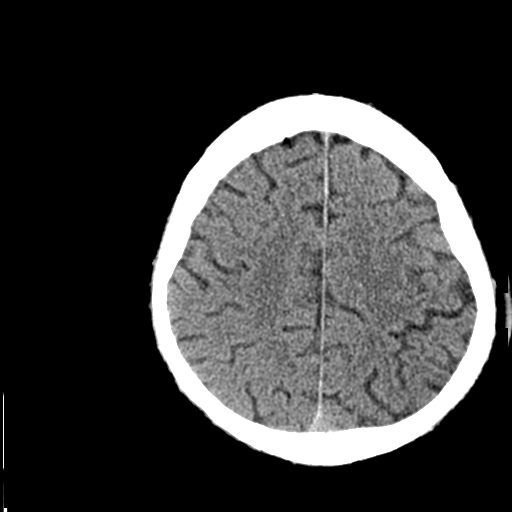
[im 28/36  brain]
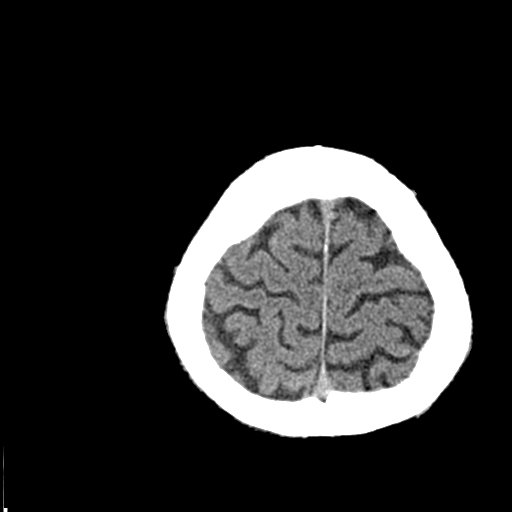
[im 32/36  brain]
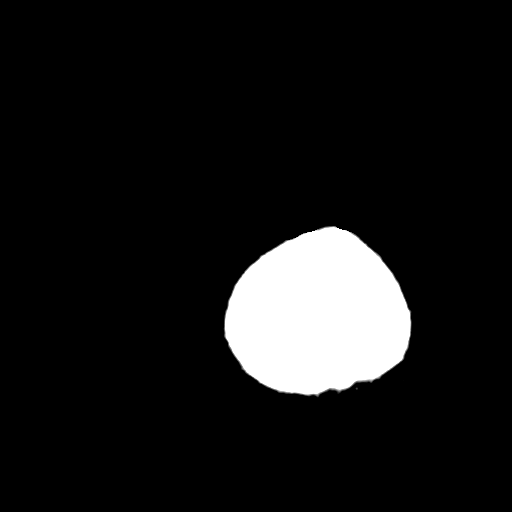

[Series 3: head 2.4 h60s bone · axial · 0.44mm/px · z∈[-129,+4]mm · 8 of 72 slices shown]
[im 8/72  bone]
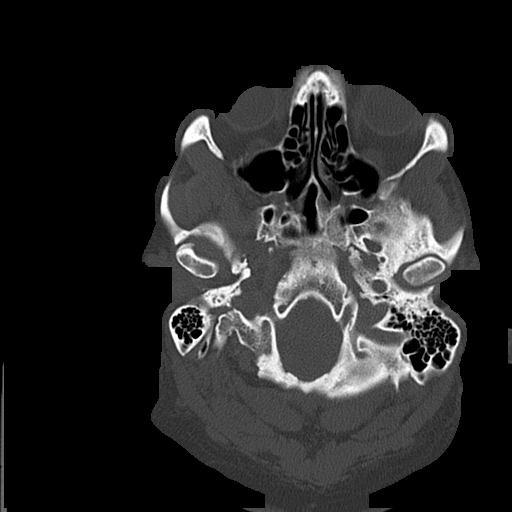
[im 15/72  bone]
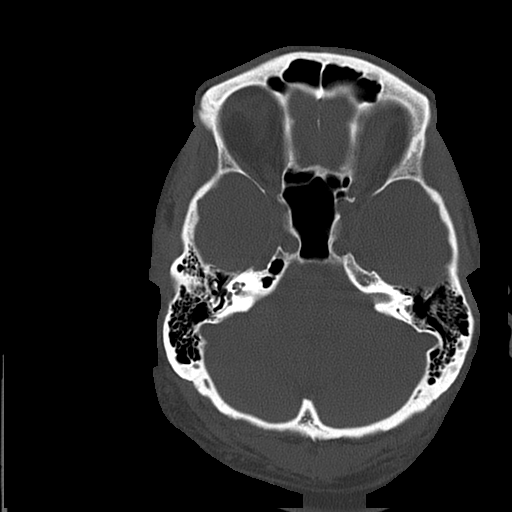
[im 23/72  bone]
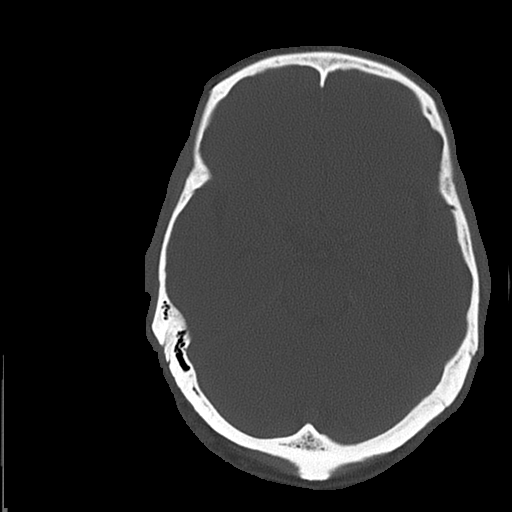
[im 30/72  bone]
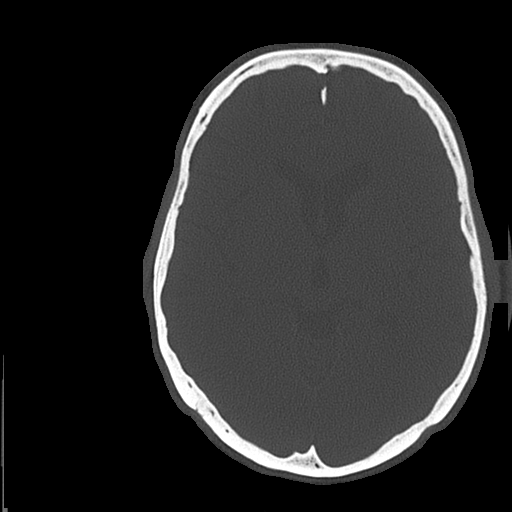
[im 42/72  bone]
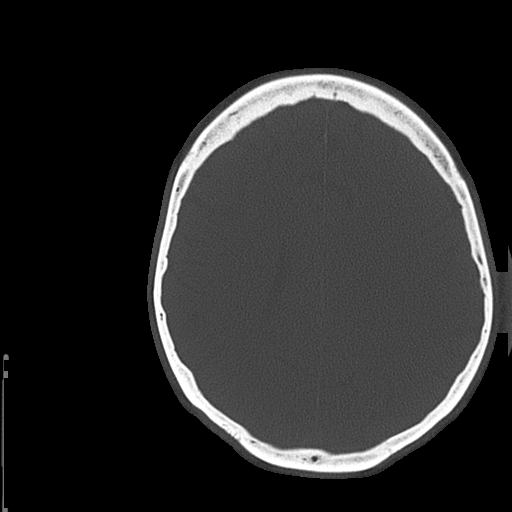
[im 49/72  bone]
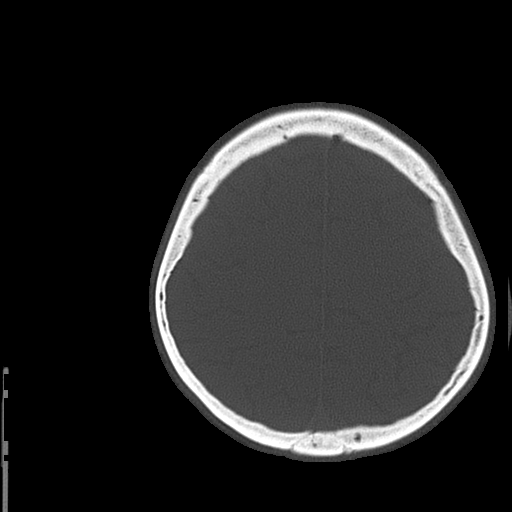
[im 57/72  bone]
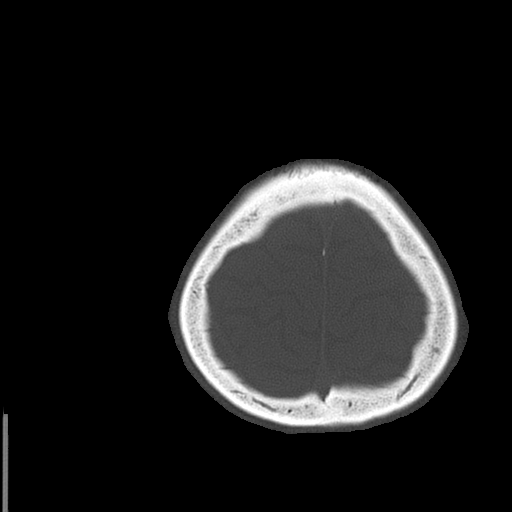
[im 64/72  bone]
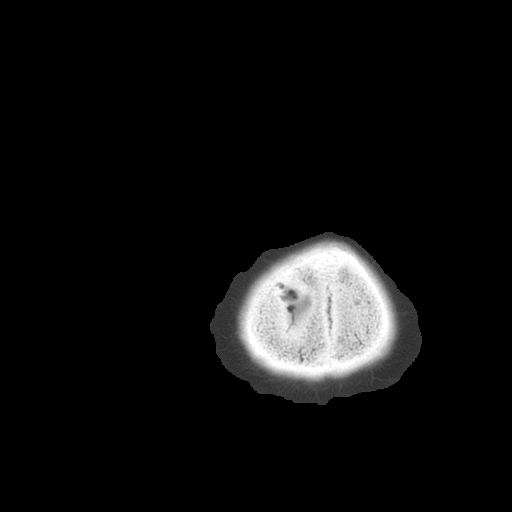

[16 of 30 positions shown; findings below may reference images not displayed]

FINDINGS: The ventricles are normal in size and configuration. There is no
mass, hemorrhage, extra-axial fluid collection, or midline shift.
Gray-white compartments are normal. No demonstrable acute infarct.
The bony calvarium appears intact. The mastoid air cells are clear.
IMPRESSION: Study within normal limits. No intracranial mass, hemorrhage, or
focal gray -white compartment lesions/acute appearing infarct.

## 2014-06-12 NOTE — ED Notes (Signed)
Pt reports feeling off balance and "not feeling right" since yesterday.

## 2014-06-12 NOTE — ED Notes (Signed)
Report given to [facility], RN carelink via phone.
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
## 2014-06-12 NOTE — ED Notes (Signed)
Patient transported to CT 

## 2014-06-12 NOTE — ED Notes (Signed)
Pt alert, NAD, calm, interactive, resps e/u, speaking in clear complete sentences, no dyspnea noted, pending arrival of transport to [facility], family x3 at [facility], "no changes", denies sx or complaints.  Given snack and soda.
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
## 2014-06-12 NOTE — ED Notes (Signed)
MD at bedside discussing results with patient 

## 2016-02-15 ENCOUNTER — Other Ambulatory Visit: Payer: Self-pay | Admitting: Orthopedic Surgery

## 2016-02-15 DIAGNOSIS — M25511 Pain in right shoulder: Secondary | ICD-10-CM

## 2016-08-22 ENCOUNTER — Ambulatory Visit (HOSPITAL_BASED_OUTPATIENT_CLINIC_OR_DEPARTMENT_OTHER)
Admission: RE | Admit: 2016-08-22 | Discharge: 2016-08-22 | Disposition: A | Discharge status: Home / Self Care | Payer: Medicare HMO | Source: Ambulatory Visit | Attending: Orthopaedic Surgery | Admitting: Orthopaedic Surgery

## 2016-08-22 ENCOUNTER — Ambulatory Visit (INDEPENDENT_AMBULATORY_CARE_PROVIDER_SITE_OTHER): Payer: Medicare HMO | Admitting: Orthopaedic Surgery

## 2016-08-22 DIAGNOSIS — M25552 Pain in left hip: Secondary | ICD-10-CM | POA: Diagnosis not present

## 2016-08-22 DIAGNOSIS — M160 Bilateral primary osteoarthritis of hip: Secondary | ICD-10-CM | POA: Diagnosis not present

## 2016-08-22 DIAGNOSIS — M1612 Unilateral primary osteoarthritis, left hip: Secondary | ICD-10-CM

## 2016-08-22 DIAGNOSIS — Z8546 Personal history of malignant neoplasm of prostate: Secondary | ICD-10-CM | POA: Diagnosis not present

## 2016-08-22 DIAGNOSIS — Z960 Presence of urogenital implants: Secondary | ICD-10-CM | POA: Diagnosis not present

## 2016-08-22 DIAGNOSIS — M47817 Spondylosis without myelopathy or radiculopathy, lumbosacral region: Secondary | ICD-10-CM | POA: Insufficient documentation

## 2016-08-22 IMAGING — DX DG HIP (WITH OR WITHOUT PELVIS) 2-3V*L*
3 series · 3 of 3 positions shown · non-contrast
Comparison: None.

CLINICAL DATA: 76-year-old male with left hip pain for 2 months. No
injury. Prostate cancer. Initial encounter.

EXAM:
DG HIP (WITH OR WITHOUT PELVIS) 2-3V LEFT

[pelvis ap]
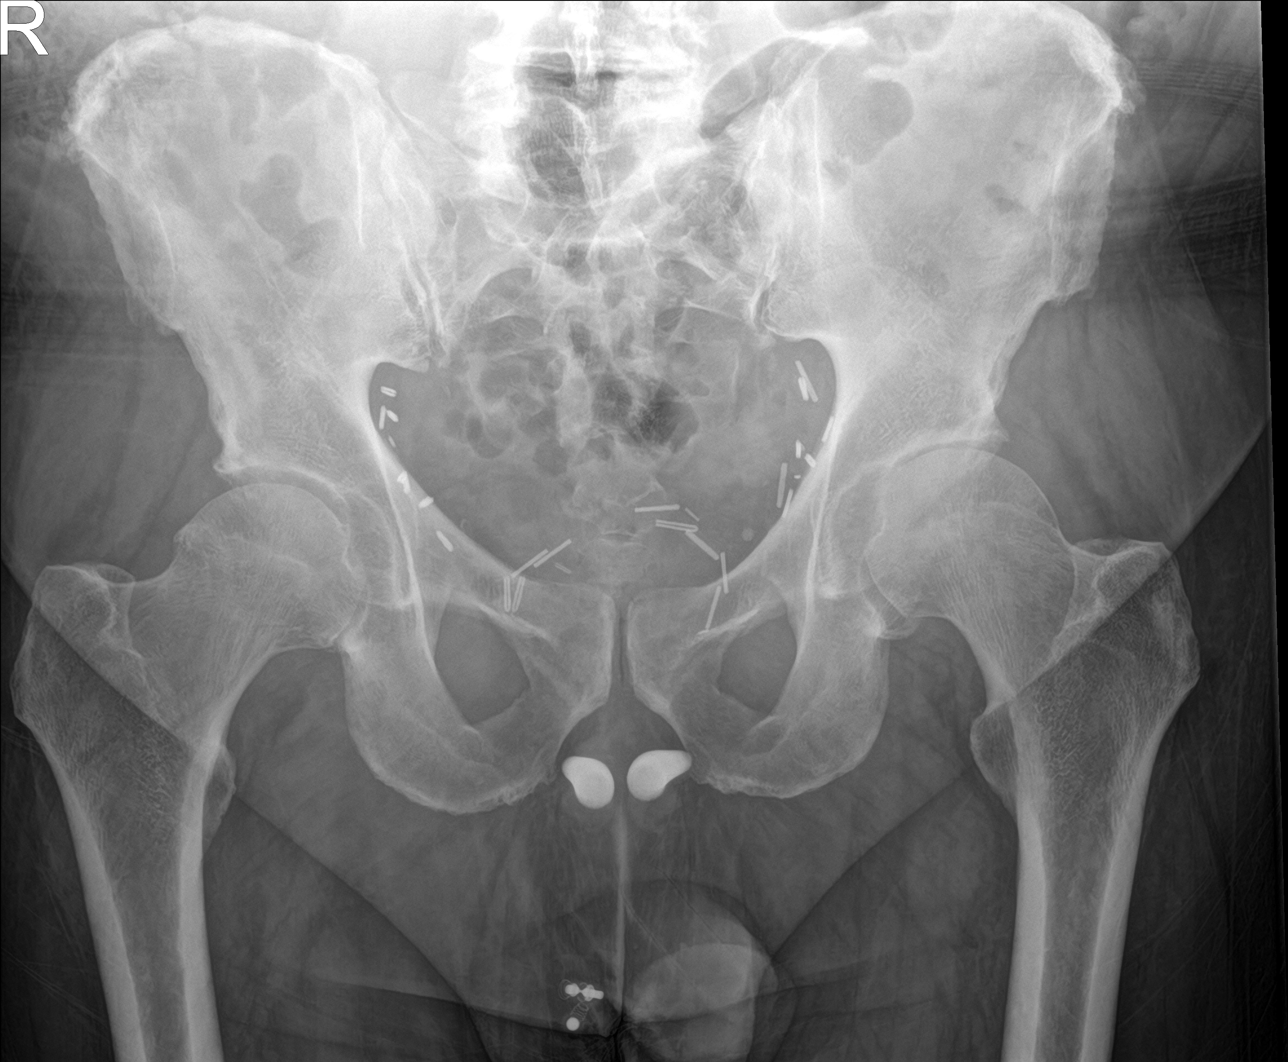

[hip ap]
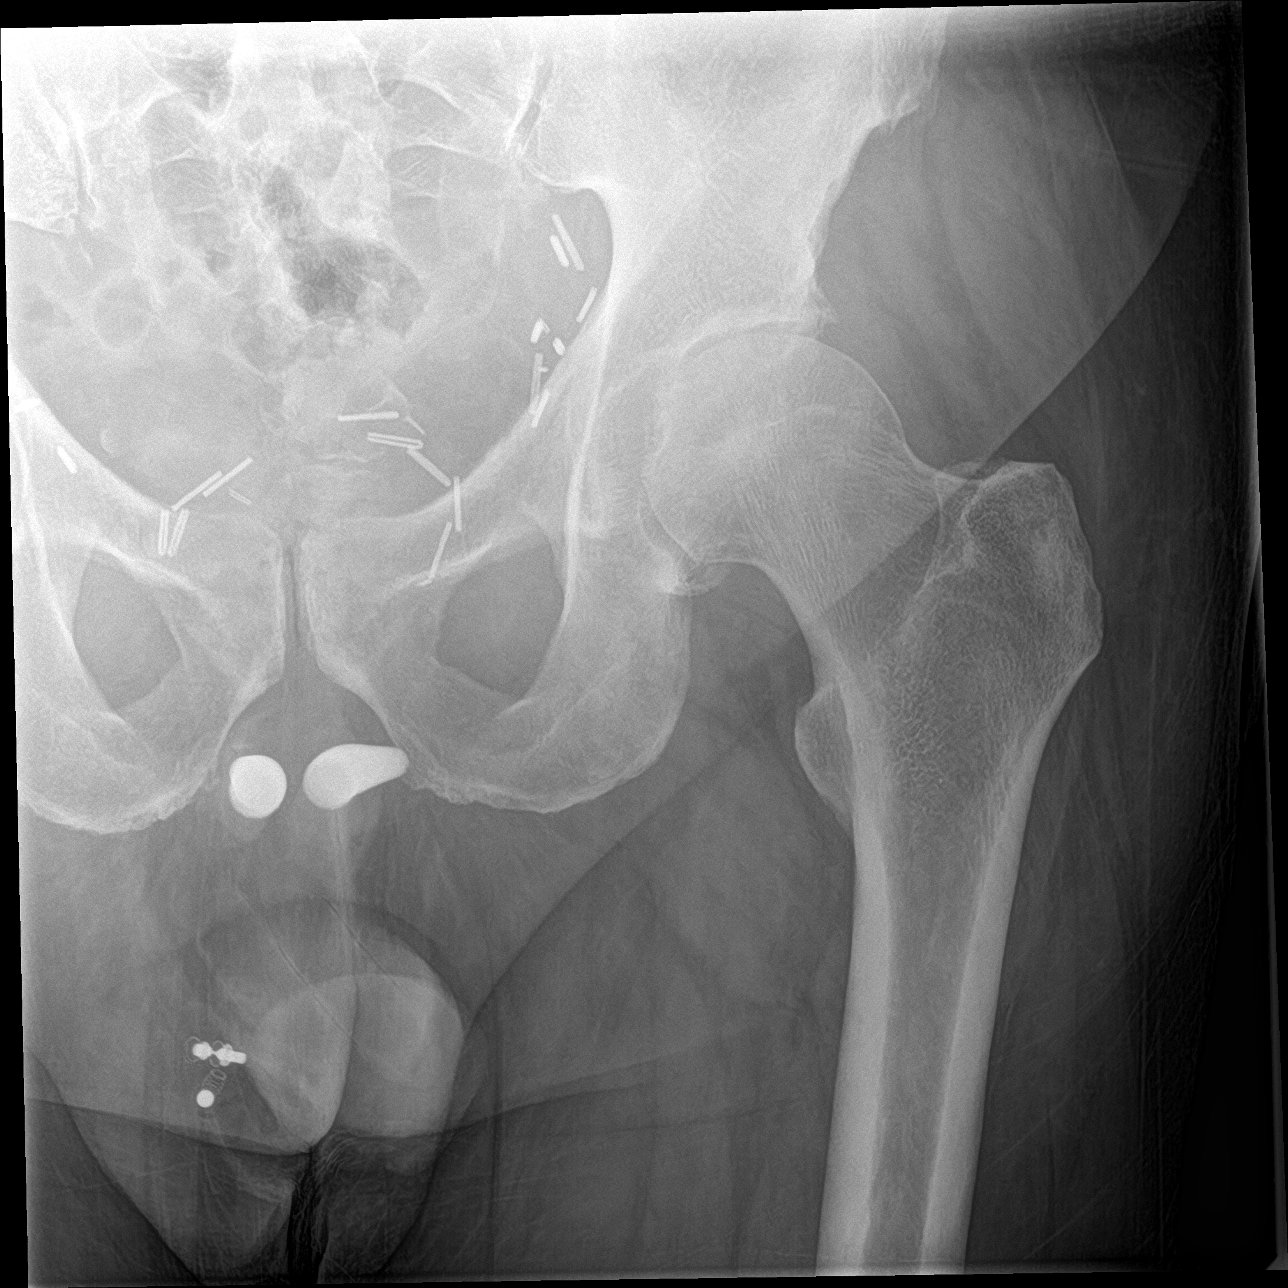

[hip lat]
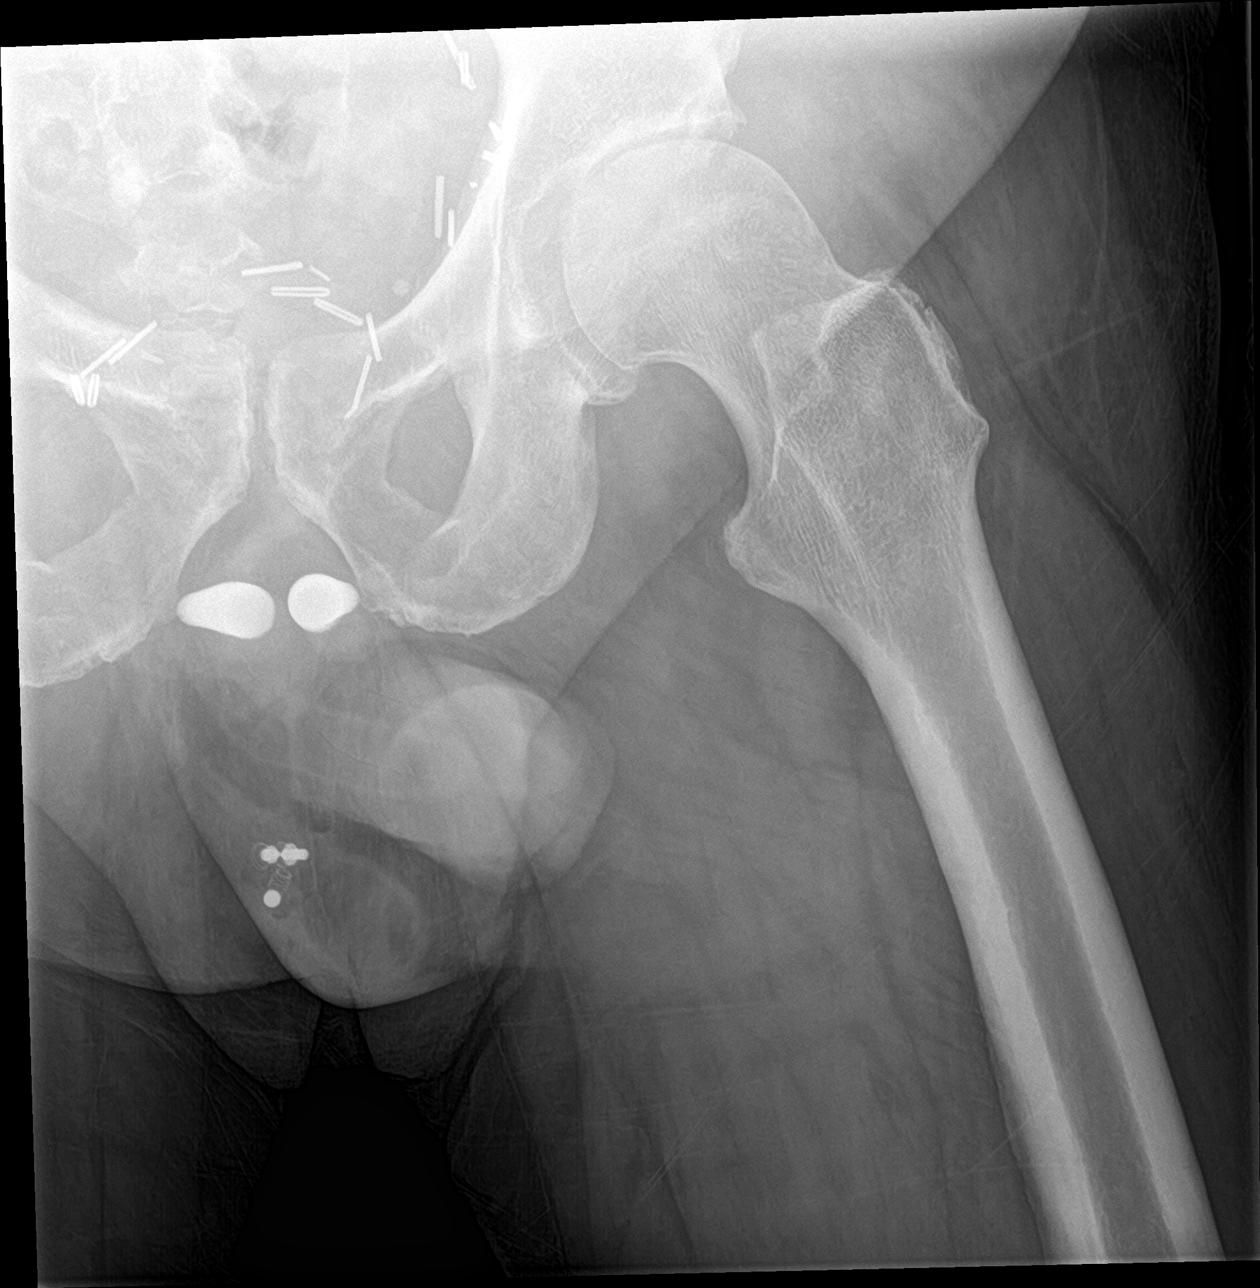

[3 of 3 positions shown; findings below may reference images not displayed]

FINDINGS: Moderate-to-marked left hip joint degenerative changes.

Mild to moderate right hip joint degenerative changes.

No plain film evidence of avascular necrosis of the femoral head.

Mild degenerative changes pubic symphysis.

Degenerative changes lower lumbar spine. Mild sacroiliac joint
degenerative changes. Mild bony overgrowth iliac wings.

No obvious sclerotic metastatic foci.

Post prostatectomy with penile implant.
IMPRESSION: Left greater than right hip joint degenerative changes as detailed
above.

## 2016-08-22 NOTE — Progress Notes (Addendum)
Office Visit Note   Patient: Manuel Hunter           Date of Birth: Aug 17, 1940           MRN: [id number] Visit Date: 08/22/2016              Requested by: No referring provider defined for this encounter. PCP: No primary care provider on file.   Assessment & Plan: Visit Diagnoses:  1. Pain in left hip   2. Unilateral primary osteoarthritis, left hip     Plan: Due to the severity of his hip disease I am recommending a left total hip replacement. His x-rays show advanced arthritis in his left hip and his x-rays have gotten progressively and rapidly worse in a short period time. Given this rapid progression of disease, conservative treatment is not warranted nor will it be effective at all. He is already tried an assistive device, anti-inflammatories, activity modification, and weight loss. An intra-articular injection would be detrimentally this point given the progression of his hip disease. Thus we are recommending hip replacement surgery. His pain is severe. It's detrimentally affected his activity is daily living, his quality of life, and his mobility. His of point where hip replaced surgery could help him significantly. Our goals would be decreased pain, improve mobility, and improved quality of life. I went over a brochure for hip replacement surgery and went over his x-rays in detail. We spent a considerable amount of time talking about the intraoperative and postoperative course for anterior hip replacement surgery and there was a thorough discussion of the risk and benefits of the surgery as well. We will work on getting the surgery set up. We would then see him back for 2 week follow-up at [facility] [place]. No x-rays of be needed at follow-up. All questions were encouraged and answered.  Follow-Up Instructions: Return for 2 weeks post-op.   Orders:  Orders Placed This Encounter  Procedures  . DG HIP UNILAT WITH PELVIS 2-3 VIEWS LEFT   No orders of the defined types were  placed in this encounter.     Procedures: No procedures performed   Clinical Data: No additional findings.   Subjective: Chief Complaint  Patient presents with  . Left Hip - Pain    HPI The patient comes in today with chief complaint of left hip pain. It is 10 out of 10. It is in the groin. It is detrimentally affected his activities daily living, his mobility, and his quality of life. It's hard getting in and out of cars. It's waking up significantly at night. He has plain films already been on getting some new ones today. He also has an MRI for me to review. He is someone who said significant arthritis throughout his body. He's had history of knee replacement. He is actually having surgery on his left hand in the near future due to wearing out cartilage. He said the hip is gotten severely worse over the last few months. Review of Systems He denies any chest pain, headache, shortness of breath, fever, chills, nausea, vomiting.  Objective: Vital Signs: There were no vitals taken for this visit.  Physical Exam He is alert and oriented 3 and in no acute distress he walks with significant limp. He slow to get up and down from a chair and onto the exam table. Ortho Exam Lamination of his right hip is normal exam. Examination of his left hip shows severe pain with any attempts of internal rotation rotation  of the hip. His leg lengths are equal. He has a total knee replacement on the left side appears stable. Specialty Comments:  No specialty comments available.  Imaging: Dg Hip Unilat With Pelvis 2-3 Views Left  Result Date: 08/22/2016 CLINICAL DATA:  76 year old male with left hip pain for 2 months. No injury. Prostate cancer. Initial encounter. EXAM: DG HIP (WITH OR WITHOUT PELVIS) 2-3V LEFT COMPARISON:  None. FINDINGS: Moderate-to-marked left hip joint degenerative changes. Mild to moderate right hip joint degenerative changes. No plain film evidence of avascular necrosis of the  femoral head. Mild degenerative changes pubic symphysis. Degenerative changes lower lumbar spine. Mild sacroiliac joint degenerative changes. Mild bony overgrowth iliac wings. No obvious sclerotic metastatic foci. Post prostatectomy with penile implant. IMPRESSION: Left greater than right hip joint degenerative changes as detailed above. Electronically Signed   By: Genia Del M.D.   On: 08/22/2016 08:52   I independently reviewed his previous hip films and today's hip films. He has significant degenerative joint disease of his left hip. There is significant loss of the joint space. This particular osteophytes and sclerotic changes. The MRI also shows severe changes over the superior lateral acetabulum the femoral head from impingement. There is degenerative tearing of the labrum as well. These films were compared to previous x-rays that were done just a few months ago and actually show worsening advancement of his disease and rapid progression of the disease process.  PMFS History: There are no active problems to display for this patient.  Past Medical History:  Diagnosis Date  . Arthritis   . Knee pain, left   . Prostate cancer   . RLS (restless legs syndrome)     No family history on file.  Past Surgical History:  Procedure Laterality Date  . KNEE SURGERY    . MANDIBLE FRACTURE SURGERY    . NECK SURGERY    . PROSTATE SURGERY    . ROTATOR CUFF REPAIR     Social History   Occupational History  . Not on file.   Social History Main Topics  . Smoking status: Never Smoker  . Smokeless tobacco: Not on file  . Alcohol use No  . Drug use: No  . Sexual activity: Not on file

## 2016-08-31 ENCOUNTER — Other Ambulatory Visit (INDEPENDENT_AMBULATORY_CARE_PROVIDER_SITE_OTHER): Payer: Self-pay | Admitting: Physician Assistant

## 2016-09-01 ENCOUNTER — Other Ambulatory Visit (INDEPENDENT_AMBULATORY_CARE_PROVIDER_SITE_OTHER): Payer: Self-pay | Admitting: Physician Assistant

## 2016-09-06 ENCOUNTER — Encounter (HOSPITAL_COMMUNITY): Payer: Self-pay

## 2016-09-06 ENCOUNTER — Encounter (HOSPITAL_COMMUNITY)
Admission: RE | Admit: 2016-09-06 | Discharge: 2016-09-06 | Disposition: A | Discharge status: Home / Self Care | Payer: Medicare HMO | Source: Ambulatory Visit | Attending: Orthopaedic Surgery | Admitting: Orthopaedic Surgery

## 2016-09-06 DIAGNOSIS — M1612 Unilateral primary osteoarthritis, left hip: Secondary | ICD-10-CM | POA: Diagnosis not present

## 2016-09-06 DIAGNOSIS — Z01812 Encounter for preprocedural laboratory examination: Secondary | ICD-10-CM | POA: Diagnosis not present

## 2016-09-06 HISTORY — DX: Adverse effect of unspecified anesthetic, initial encounter: T41.45XA

## 2016-09-06 HISTORY — DX: Other complications of anesthesia, initial encounter: T88.59XA

## 2016-09-06 HISTORY — DX: Gastro-esophageal reflux disease without esophagitis: K21.9

## 2016-09-06 HISTORY — DX: Headache, unspecified: R51.9

## 2016-09-06 HISTORY — DX: Sleep apnea, unspecified: G47.30

## 2016-09-06 HISTORY — DX: Personal history of other diseases of the digestive system: Z87.19

## 2016-09-06 HISTORY — DX: Headache: R51

## 2016-09-06 LAB — BASIC METABOLIC PANEL
ANION GAP: 10 (ref 5–15)
BUN: 15 mg/dL (ref 6–20)
CHLORIDE: 105 mmol/L (ref 101–111)
CO2: 24 mmol/L (ref 22–32)
Calcium: 9.1 mg/dL (ref 8.9–10.3)
Creatinine, Ser: 1.08 mg/dL (ref 0.61–1.24)
Glucose, Bld: 110 mg/dL — ABNORMAL HIGH (ref 65–99)
Potassium: 4.4 mmol/L (ref 3.5–5.1)
SODIUM: 139 mmol/L (ref 135–145)

## 2016-09-06 LAB — CBC
HCT: 41.4 % (ref 39.0–52.0)
Hemoglobin: 14.4 g/dL (ref 13.0–17.0)
MCH: 31.6 pg (ref 26.0–34.0)
MCHC: 34.8 g/dL (ref 30.0–36.0)
MCV: 90.8 fL (ref 78.0–100.0)
Platelets: 216 10*3/uL (ref 150–400)
RBC: 4.56 MIL/uL (ref 4.22–5.81)
RDW: 13.4 % (ref 11.5–15.5)
WBC: 6.5 10*3/uL (ref 4.0–10.5)

## 2016-09-06 LAB — SURGICAL PCR SCREEN
MRSA, PCR: NEGATIVE
STAPHYLOCOCCUS AUREUS: NEGATIVE

## 2016-09-06 NOTE — Progress Notes (Signed)
PCP is Dr. Reita Cliche Denies ever seeing a cardiologist.  Denies ever having a card cath States he had a stress test and echo many years ago Denies any chest pain. Instructed to bring his CPAP on the day of surgery- voices understanding.

## 2016-09-06 NOTE — Pre-Procedure Instructions (Signed)
Manuel Hunter  09/06/2016      [id number] - [place], [address] [place] [address] Phone: [phone] Fax: [phone]    Your procedure is scheduled on Feb 27.  Report to [facility] Admitting at 100 pm  Call this number if you have problems the morning of surgery:  [phone]   Remember:  Do not eat food or drink liquids after midnight.  Take these medicines the morning of surgery with A SIP OF WATER Methocarbamol (Robaxin) if needed, Pantoprazole (Protonix), Requip  Stop taking aspirin, BC's, Goody's, Herbal medications, Fish Oil, Ibuprofen, Advil, Motrin, Aleve, Vitamins, Anaprox, Naproxen   Do not wear jewelry, make-up or nail polish.  Do not wear lotions, powders, or perfumes, or deoderant.  Do not shave 48 hours prior to surgery.  Men may shave face and neck.  Do not bring valuables to the hospital.  [facility] is not responsible for any belongings or valuables.  Contacts, dentures or bridgework may not be worn into surgery.  Leave your suitcase in the car.  After surgery it may be brought to your room.  For patients admitted to the hospital, discharge time will be determined by your treatment team.  Patients discharged the day of surgery will not be allowed to drive home.    Special instructions:  [place] - Preparing for Surgery  Before surgery, you can play an important role.  Because skin is not sterile, your skin needs to be as free of germs as possible.  You can reduce the number of germs on you skin by washing with CHG (chlorahexidine gluconate) soap before surgery.  CHG is an antiseptic cleaner which kills germs and bonds with the skin to continue killing germs even after washing.  Please DO NOT use if you have an allergy to CHG or antibacterial soaps.  If your skin becomes reddened/irritated stop using the CHG and inform your nurse when you arrive at [facility].  Do not shave (including legs and underarms) for at least 48 hours prior to the first CHG shower.  You may shave your face.  Please follow these instructions carefully:   1.  Shower with CHG Soap the night before surgery and the    morning of Surgery.  2.  If you choose to wash your hair, wash your hair first as usual with your   normal shampoo.  3.  After you shampoo, rinse your hair and body thoroughly to remove the Shampoo.  4.  Use CHG as you would any other liquid soap.  You can apply chg directly       to the skin and wash gently with scrungie or a clean washcloth.  5.  Apply the CHG Soap to your body ONLY FROM THE NECK DOWN.        Do not use on open wounds or open sores.  Avoid contact with your eyes,       ears, mouth and genitals (private parts).  Wash genitals (private parts)       with your normal soap.  6.  Wash thoroughly, paying special attention to the area where your surgery        will be performed.  7.  Thoroughly rinse your body with warm water from the neck down.  8.  DO NOT shower/wash with your normal soap after using and rinsing off  the CHG Soap.  9.  Pat yourself dry with a clean towel.            10.  Wear clean pajamas.            11.  Place clean sheets on your bed the night of your first shower and do not        sleep with pets.  Day of Surgery  Do not apply any lotions/deoderants the morning of surgery.  Please wear clean clothes to the hospital/surgery center.     Please read over the following fact sheets that you were given. Pain Booklet, Coughing and Deep Breathing, MRSA Information and Surgical Site Infection Prevention

## 2016-09-12 MED ORDER — TRANEXAMIC ACID 1000 MG/10ML IV SOLN
1000.0000 mg | INTRAVENOUS | Status: AC
  Administered 2016-09-13: 1000 mg via INTRAVENOUS
  Filled 2016-09-12: qty 10

## 2016-09-13 ENCOUNTER — Encounter (HOSPITAL_COMMUNITY): Payer: Self-pay | Admitting: Certified Registered Nurse Anesthetist

## 2016-09-13 ENCOUNTER — Encounter (HOSPITAL_COMMUNITY)
Admission: RE | Disposition: A | Discharge status: Home Health | Payer: Self-pay | Source: Ambulatory Visit | Attending: Orthopaedic Surgery

## 2016-09-13 ENCOUNTER — Inpatient Hospital Stay (HOSPITAL_COMMUNITY): Payer: Medicare HMO | Admitting: Certified Registered Nurse Anesthetist

## 2016-09-13 ENCOUNTER — Inpatient Hospital Stay (HOSPITAL_COMMUNITY): Payer: Medicare HMO

## 2016-09-13 ENCOUNTER — Inpatient Hospital Stay (HOSPITAL_COMMUNITY)
Admission: RE | Admit: 2016-09-13 | Discharge: 2016-09-16 | DRG: 470 | Disposition: A | Discharge status: Home Health | Payer: Medicare HMO | Source: Ambulatory Visit | Attending: Orthopaedic Surgery | Admitting: Orthopaedic Surgery

## 2016-09-13 DIAGNOSIS — M25552 Pain in left hip: Secondary | ICD-10-CM | POA: Diagnosis present

## 2016-09-13 DIAGNOSIS — Z8546 Personal history of malignant neoplasm of prostate: Secondary | ICD-10-CM

## 2016-09-13 DIAGNOSIS — G4730 Sleep apnea, unspecified: Secondary | ICD-10-CM | POA: Diagnosis present

## 2016-09-13 DIAGNOSIS — Z7982 Long term (current) use of aspirin: Secondary | ICD-10-CM

## 2016-09-13 DIAGNOSIS — M1612 Unilateral primary osteoarthritis, left hip: Secondary | ICD-10-CM | POA: Diagnosis present

## 2016-09-13 DIAGNOSIS — Z419 Encounter for procedure for purposes other than remedying health state, unspecified: Secondary | ICD-10-CM

## 2016-09-13 DIAGNOSIS — K219 Gastro-esophageal reflux disease without esophagitis: Secondary | ICD-10-CM | POA: Diagnosis present

## 2016-09-13 DIAGNOSIS — G2581 Restless legs syndrome: Secondary | ICD-10-CM | POA: Diagnosis present

## 2016-09-13 DIAGNOSIS — Z96642 Presence of left artificial hip joint: Secondary | ICD-10-CM

## 2016-09-13 HISTORY — PX: TOTAL HIP ARTHROPLASTY: SHX124

## 2016-09-13 IMAGING — CR DG PORTABLE PELVIS
2 series · 2 of 2 positions shown · non-contrast
Comparison: None.

CLINICAL DATA: Status post left hip replacement.

EXAM:
PORTABLE PELVIS 1-2 VIEWS

[AP (1 of 2)]
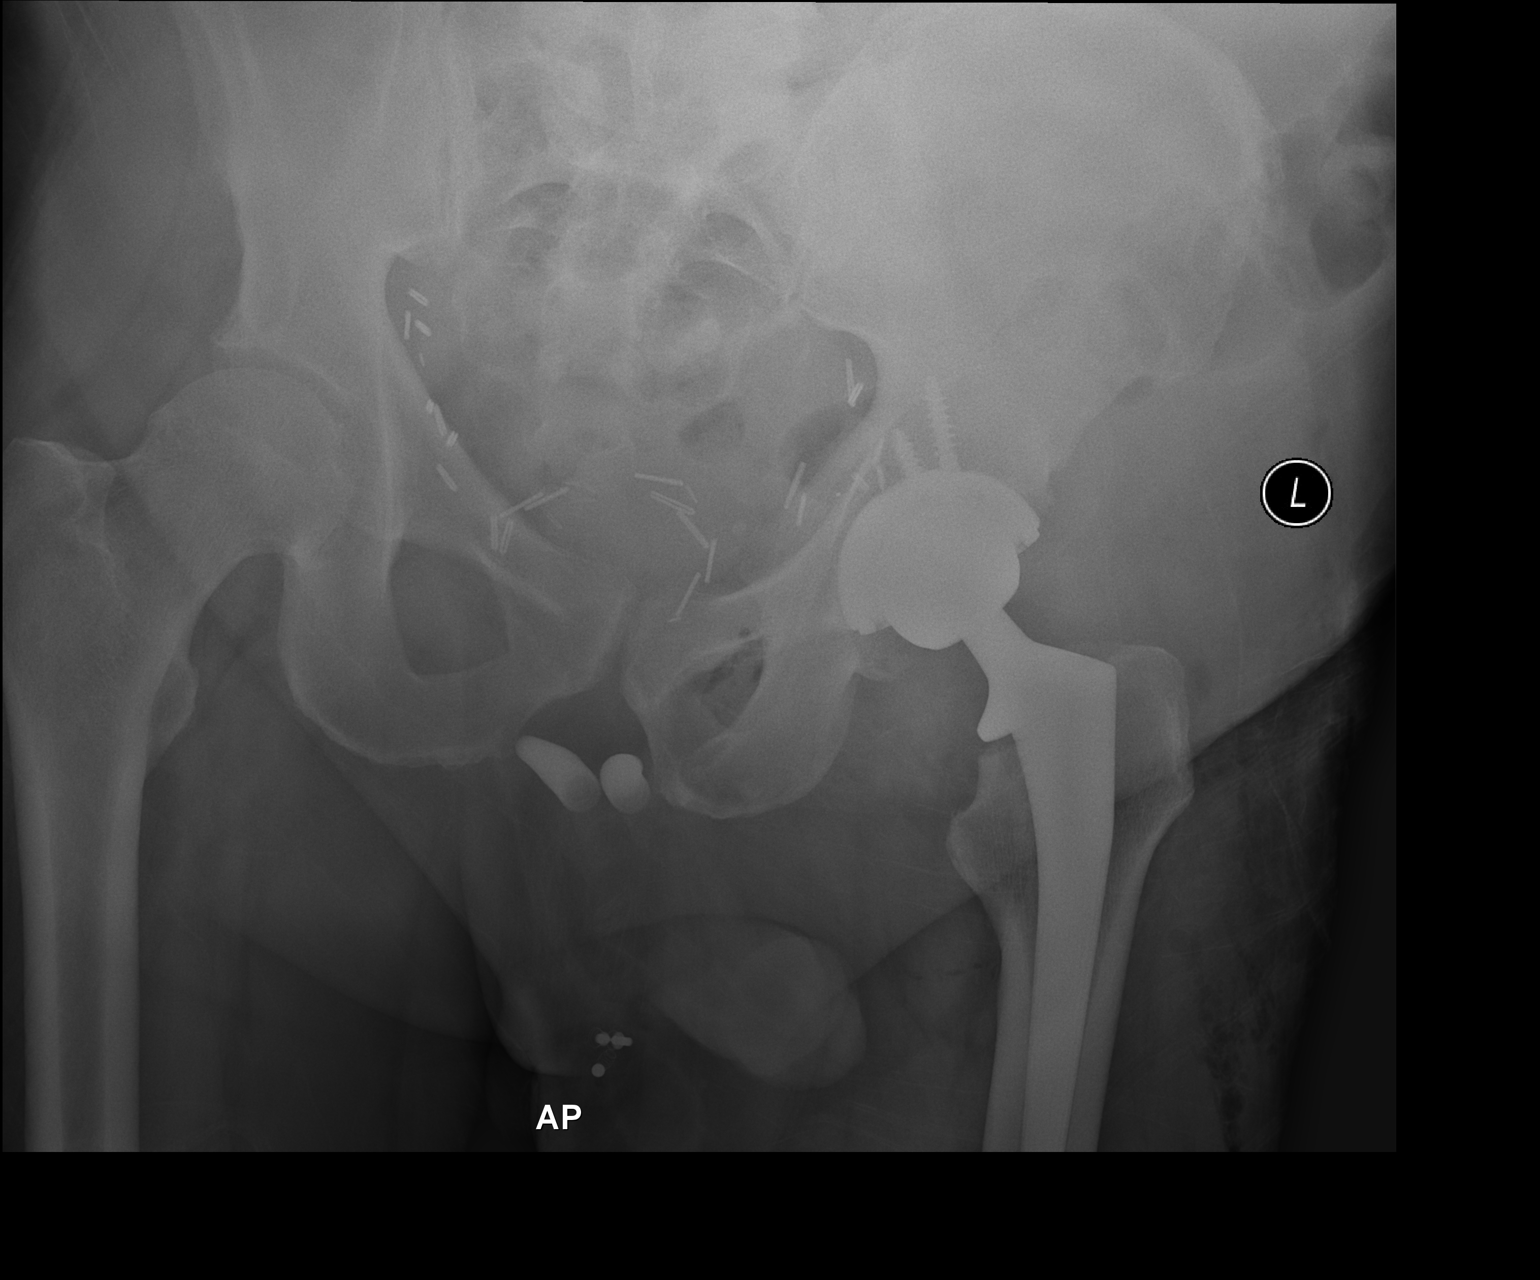

[AP (2 of 2)]
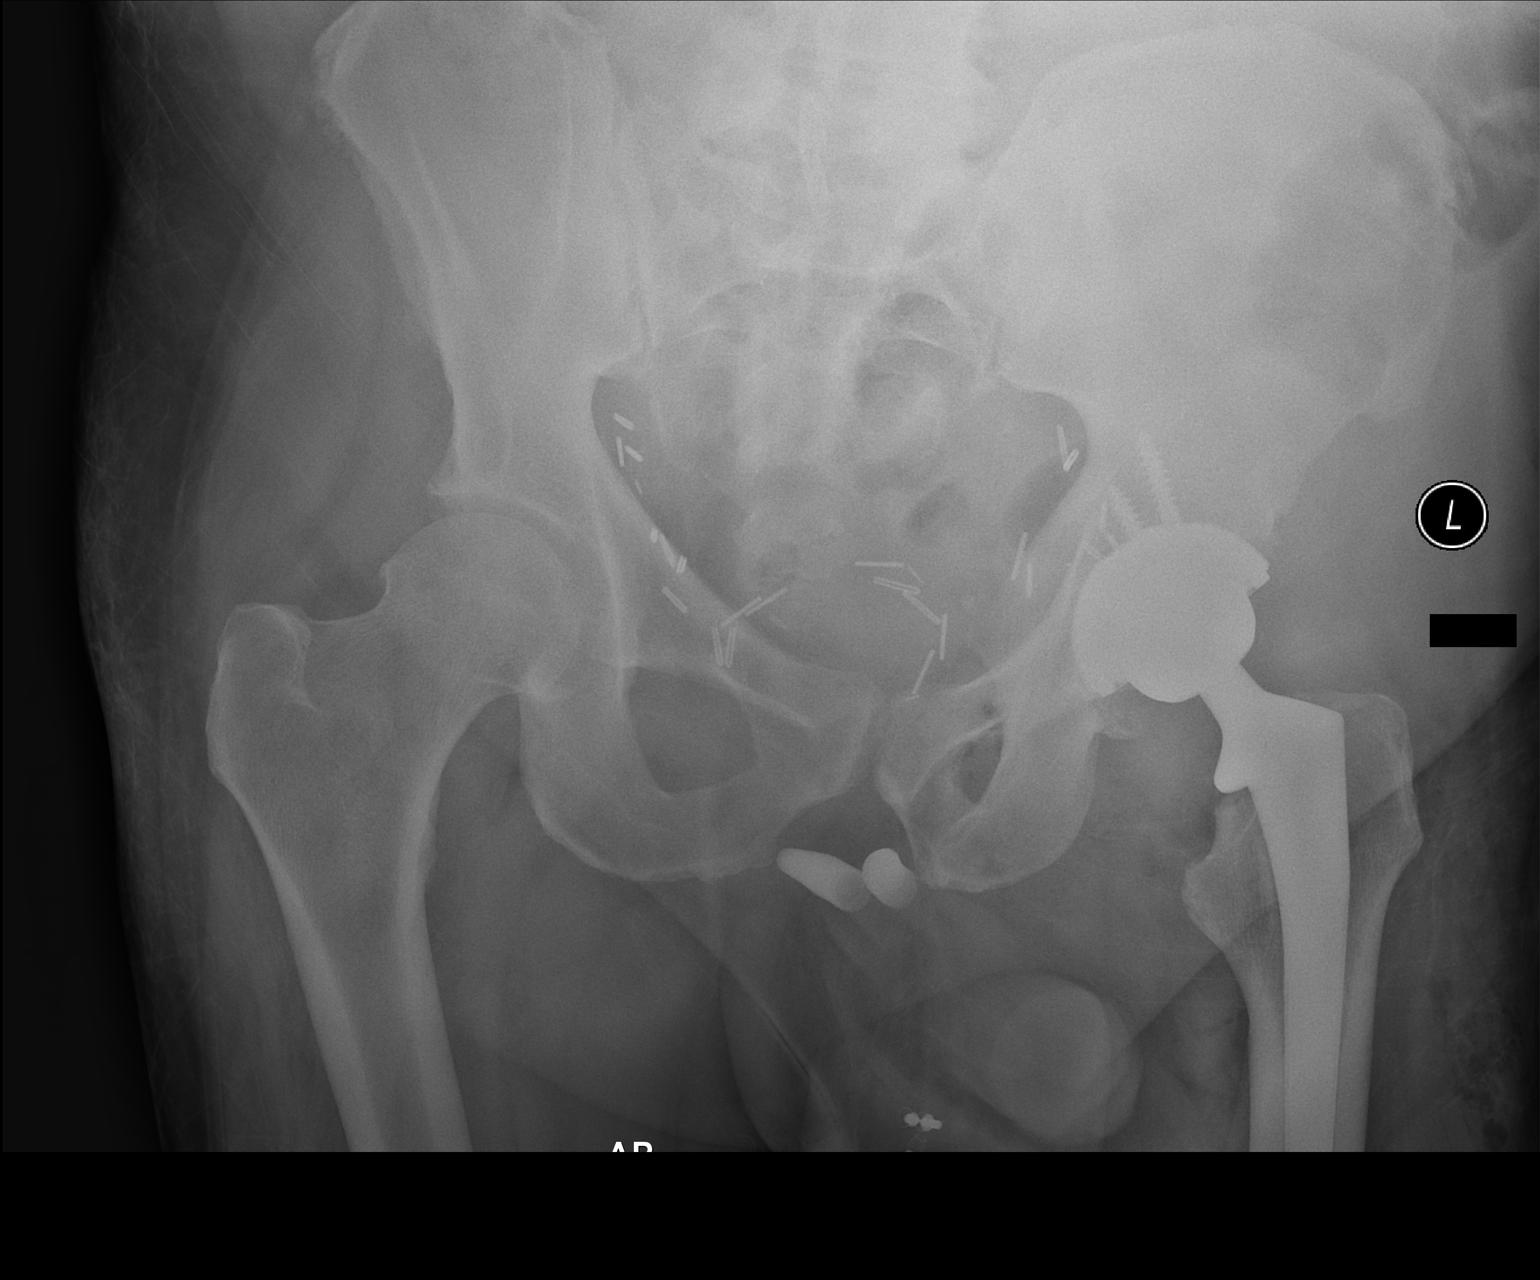

[2 of 2 positions shown; findings below may reference images not displayed]

FINDINGS: The left femoral and acetabular components appear to be well
situated. Expected postoperative changes seen in the adjacent soft
tissues. Postsurgical changes are noted in the pelvis. No fracture
or dislocation is noted.
IMPRESSION: Status post left total hip arthroplasty.

## 2016-09-13 IMAGING — RF DG HIP (WITH PELVIS) OPERATIVE*L*
1 series · 3 of 3 positions shown · non-contrast
Comparison: [DATE] .

CLINICAL DATA: Hip replacement.

EXAM:
OPERATIVE LEFT HIP (WITH PELVIS IF PERFORMED)  VIEWS
TECHNIQUE: Fluoroscopic spot image(s) were submitted for interpretation
post-operatively.

[Series 1: run · 3 of 3 slices shown]
[im 1/3]
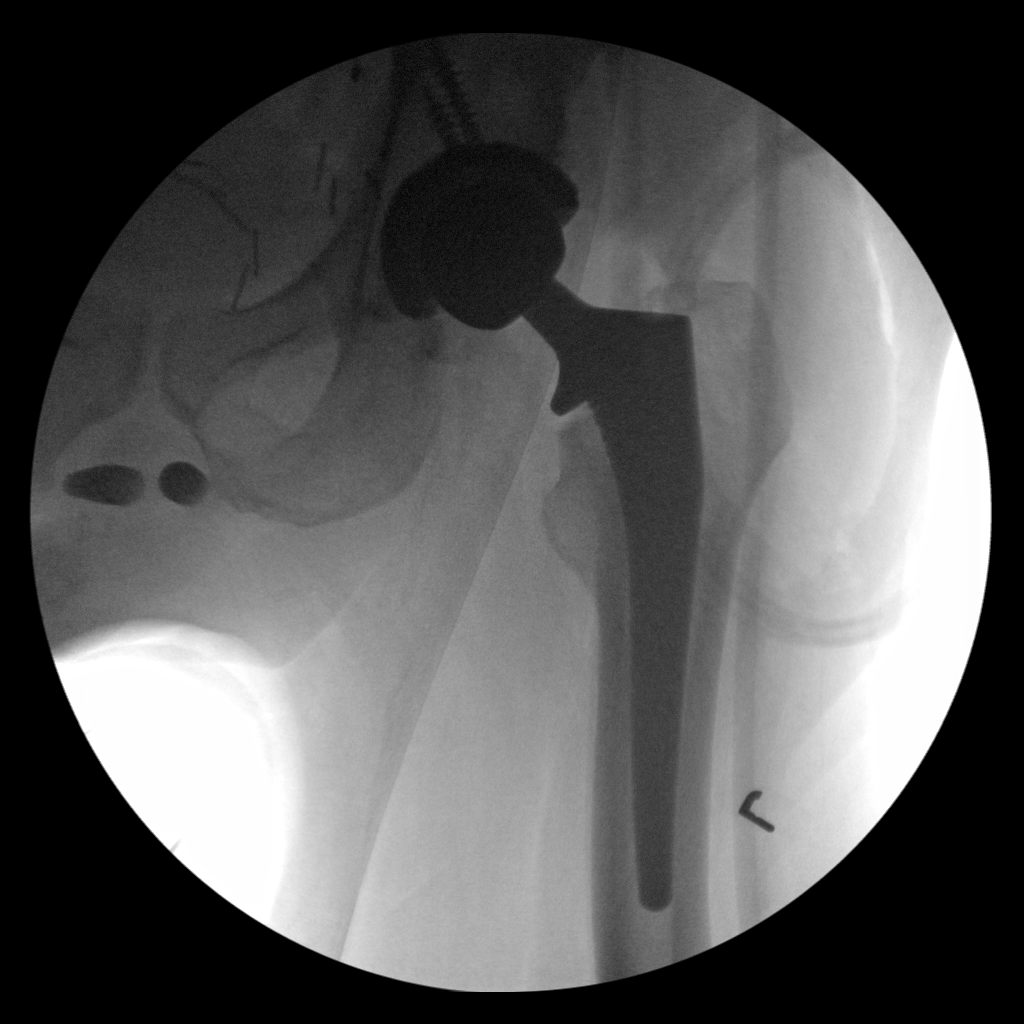
[im 2/3]
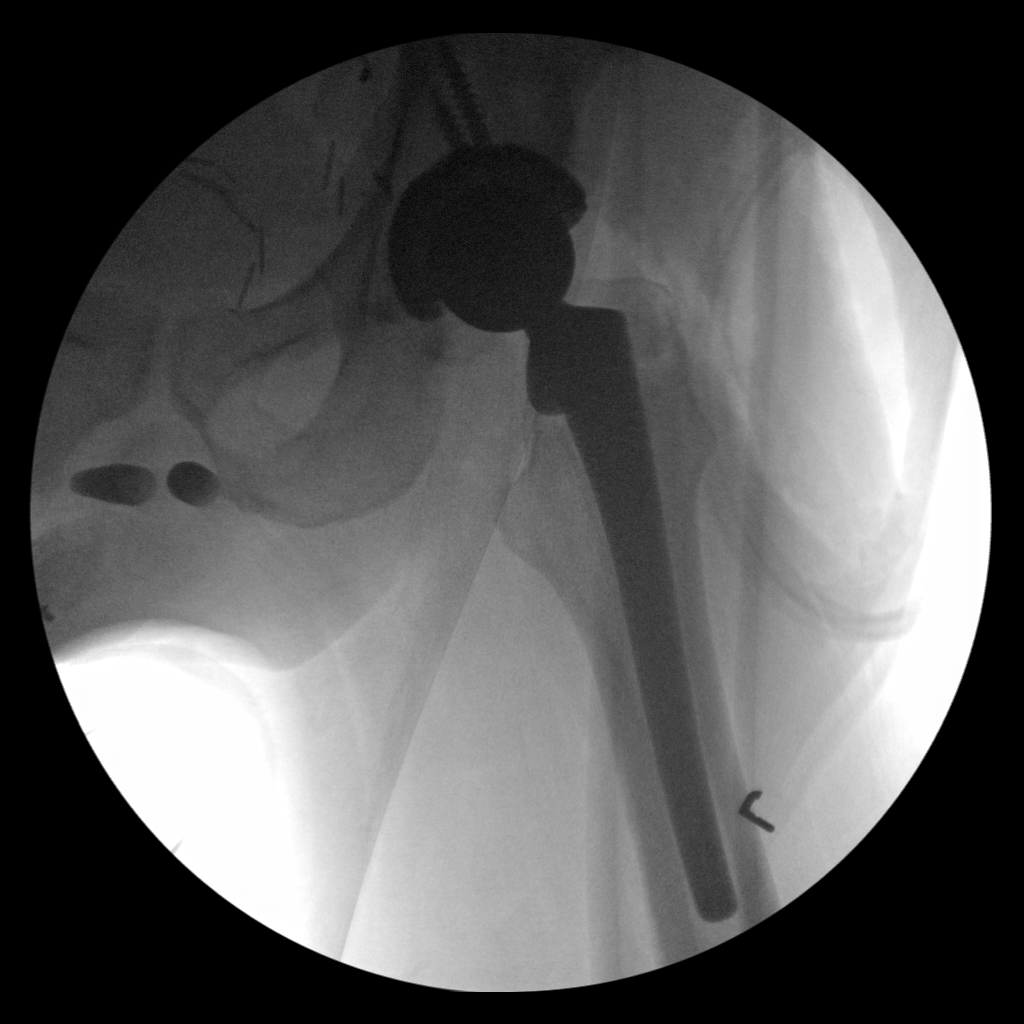
[im 3/3]
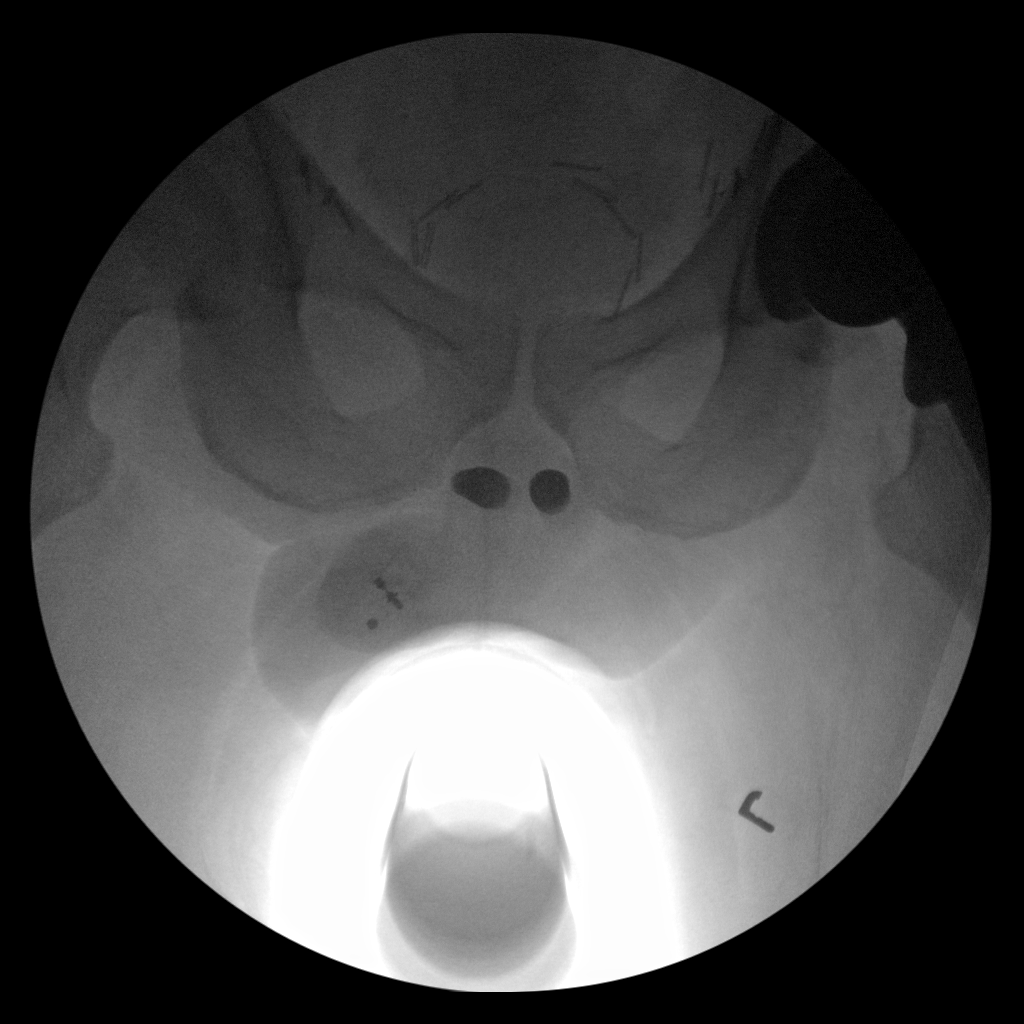

[3 of 3 positions shown; findings below may reference images not displayed]

FINDINGS: Total left hip replacement. Hardware intact. Anatomic alignment. No
acute bony abnormality. Penile prosthesis noted. Surgical clips in
the pelvis.
IMPRESSION: Total left hip replacement with good anatomic alignment.

## 2016-09-13 SURGERY — ARTHROPLASTY, HIP, TOTAL, ANTERIOR APPROACH
Anesthesia: Spinal | Site: Hip | Laterality: Left

## 2016-09-13 MED ORDER — EPHEDRINE SULFATE-NACL 50-0.9 MG/10ML-% IV SOSY
PREFILLED_SYRINGE | INTRAVENOUS | Status: DC | PRN
  Administered 2016-09-13: 10 mg via INTRAVENOUS

## 2016-09-13 MED ORDER — HYDROMORPHONE HCL 2 MG/ML IJ SOLN: 1.0000 mg | INTRAMUSCULAR | Status: DC | PRN

## 2016-09-13 MED ORDER — SUCCINYLCHOLINE CHLORIDE 200 MG/10ML IV SOSY
PREFILLED_SYRINGE | INTRAVENOUS | Status: AC
Start: 2016-09-13 — End: 2016-09-13
  Filled 2016-09-13: qty 10

## 2016-09-13 MED ORDER — TRAMADOL HCL 50 MG PO TABS
100.0000 mg | ORAL_TABLET | Freq: Four times a day (QID) | ORAL | Status: DC | PRN
  Administered 2016-09-15 – 2016-09-16 (×3): 100 mg via ORAL
  Filled 2016-09-13 (×3): qty 2

## 2016-09-13 MED ORDER — BUPIVACAINE IN DEXTROSE 0.75-8.25 % IT SOLN
INTRATHECAL | Status: DC | PRN
  Administered 2016-09-13: 1.7 mL via INTRATHECAL

## 2016-09-13 MED ORDER — KETOROLAC TROMETHAMINE 15 MG/ML IJ SOLN
7.5000 mg | Freq: Four times a day (QID) | INTRAMUSCULAR | Status: AC
  Administered 2016-09-13 – 2016-09-14 (×4): 7.5 mg via INTRAVENOUS
  Filled 2016-09-13 (×4): qty 1

## 2016-09-13 MED ORDER — METHOCARBAMOL 1000 MG/10ML IJ SOLN: 500.0000 mg | Freq: Four times a day (QID) | INTRAVENOUS | Status: DC | PRN

## 2016-09-13 MED ORDER — ALUM & MAG HYDROXIDE-SIMETH 200-200-20 MG/5ML PO SUSP
30.0000 mL | ORAL | Status: DC | PRN
  Administered 2016-09-15: 30 mL via ORAL
  Filled 2016-09-13: qty 30

## 2016-09-13 MED ORDER — METHOCARBAMOL 500 MG PO TABS
500.0000 mg | ORAL_TABLET | Freq: Four times a day (QID) | ORAL | Status: DC | PRN
  Administered 2016-09-13 – 2016-09-15 (×4): 500 mg via ORAL
  Filled 2016-09-13 (×3): qty 1

## 2016-09-13 MED ORDER — PROPOFOL 500 MG/50ML IV EMUL
INTRAVENOUS | Status: DC | PRN
  Administered 2016-09-13: 100 ug/kg/min via INTRAVENOUS

## 2016-09-13 MED ORDER — CEFAZOLIN IN D5W 1 GM/50ML IV SOLN
1.0000 g | Freq: Four times a day (QID) | INTRAVENOUS | Status: AC
  Administered 2016-09-13 – 2016-09-14 (×2): 1 g via INTRAVENOUS
  Filled 2016-09-13 (×2): qty 50

## 2016-09-13 MED ORDER — ROPINIROLE HCL 1 MG PO TABS
1.0000 mg | ORAL_TABLET | Freq: Three times a day (TID) | ORAL | Status: DC
  Administered 2016-09-14 – 2016-09-16 (×7): 1 mg via ORAL
  Filled 2016-09-13 (×6): qty 1

## 2016-09-13 MED ORDER — ZOLPIDEM TARTRATE 5 MG PO TABS
5.0000 mg | ORAL_TABLET | Freq: Every evening | ORAL | Status: DC | PRN
  Administered 2016-09-13 – 2016-09-15 (×3): 5 mg via ORAL
  Filled 2016-09-13 (×3): qty 1

## 2016-09-13 MED ORDER — PROPOFOL 10 MG/ML IV BOLUS
INTRAVENOUS | Status: AC
  Filled 2016-09-13: qty 20

## 2016-09-13 MED ORDER — HYDROMORPHONE HCL 1 MG/ML IJ SOLN
INTRAMUSCULAR | Status: AC
  Administered 2016-09-13: 0.5 mg via INTRAVENOUS
  Filled 2016-09-13: qty 0.5

## 2016-09-13 MED ORDER — METHOCARBAMOL 500 MG PO TABS
ORAL_TABLET | ORAL | Status: AC
  Filled 2016-09-13: qty 1

## 2016-09-13 MED ORDER — PHENOL 1.4 % MT LIQD
1.0000 | OROMUCOSAL | Status: DC | PRN
Start: 2016-09-13 — End: 2016-09-16

## 2016-09-13 MED ORDER — HYDROMORPHONE HCL 1 MG/ML IJ SOLN
INTRAMUSCULAR | Status: AC
  Filled 2016-09-13: qty 0.5

## 2016-09-13 MED ORDER — HYDROMORPHONE HCL 1 MG/ML IJ SOLN
0.2500 mg | INTRAMUSCULAR | Status: DC | PRN
  Administered 2016-09-13 (×3): 0.5 mg via INTRAVENOUS

## 2016-09-13 MED ORDER — MIDAZOLAM HCL 2 MG/2ML IJ SOLN
INTRAMUSCULAR | Status: DC | PRN
  Administered 2016-09-13: 2 mg via INTRAVENOUS

## 2016-09-13 MED ORDER — PHENYLEPHRINE HCL 10 MG/ML IJ SOLN
INTRAVENOUS | Status: DC | PRN
  Administered 2016-09-13: 40 ug/min via INTRAVENOUS

## 2016-09-13 MED ORDER — PHENYTOIN SODIUM EXTENDED 100 MG PO CAPS
200.0000 mg | ORAL_CAPSULE | Freq: Every day | ORAL | Status: DC
  Administered 2016-09-13 – 2016-09-15 (×3): 200 mg via ORAL
  Filled 2016-09-13 (×3): qty 2

## 2016-09-13 MED ORDER — ASPIRIN 81 MG PO CHEW
81.0000 mg | CHEWABLE_TABLET | Freq: Two times a day (BID) | ORAL | Status: DC
  Administered 2016-09-13 – 2016-09-16 (×6): 81 mg via ORAL
  Filled 2016-09-13 (×6): qty 1

## 2016-09-13 MED ORDER — CHLORHEXIDINE GLUCONATE 4 % EX LIQD: 60.0000 mL | Freq: Once | CUTANEOUS | Status: DC

## 2016-09-13 MED ORDER — OXYCODONE HCL 5 MG PO TABS
5.0000 mg | ORAL_TABLET | ORAL | Status: DC | PRN
  Administered 2016-09-13 – 2016-09-15 (×6): 10 mg via ORAL
  Filled 2016-09-13 (×6): qty 2

## 2016-09-13 MED ORDER — SODIUM CHLORIDE 0.9 % IR SOLN
Status: DC | PRN
  Administered 2016-09-13: 3000 mL

## 2016-09-13 MED ORDER — DOCUSATE SODIUM 100 MG PO CAPS
100.0000 mg | ORAL_CAPSULE | Freq: Two times a day (BID) | ORAL | Status: DC
  Administered 2016-09-13 – 2016-09-16 (×6): 100 mg via ORAL
  Filled 2016-09-13 (×6): qty 1

## 2016-09-13 MED ORDER — MIDAZOLAM HCL 2 MG/2ML IJ SOLN
INTRAMUSCULAR | Status: AC
Start: 2016-09-13 — End: 2016-09-13
  Filled 2016-09-13: qty 2

## 2016-09-13 MED ORDER — MENTHOL 3 MG MT LOZG: 1.0000 | LOZENGE | OROMUCOSAL | Status: DC | PRN

## 2016-09-13 MED ORDER — LACTATED RINGERS IV SOLN
INTRAVENOUS | Status: DC
  Administered 2016-09-13: 50 mL/h via INTRAVENOUS
  Administered 2016-09-13: 17:00:00 via INTRAVENOUS

## 2016-09-13 MED ORDER — CEFAZOLIN SODIUM-DEXTROSE 2-4 GM/100ML-% IV SOLN
2.0000 g | INTRAVENOUS | Status: AC
  Administered 2016-09-13: 2 g via INTRAVENOUS

## 2016-09-13 MED ORDER — HYDROMORPHONE HCL 1 MG/ML IJ SOLN: 0.2500 mg | INTRAMUSCULAR | Status: DC | PRN

## 2016-09-13 MED ORDER — ACETAMINOPHEN 650 MG RE SUPP: 650.0000 mg | Freq: Four times a day (QID) | RECTAL | Status: DC | PRN

## 2016-09-13 MED ORDER — FENTANYL CITRATE (PF) 100 MCG/2ML IJ SOLN
INTRAMUSCULAR | Status: DC | PRN
Start: 2016-09-13 — End: 2016-09-13
  Administered 2016-09-13: 50 ug via INTRAVENOUS

## 2016-09-13 MED ORDER — ONDANSETRON HCL 4 MG/2ML IJ SOLN
INTRAMUSCULAR | Status: AC
Start: 2016-09-13 — End: 2016-09-13
  Filled 2016-09-13: qty 2

## 2016-09-13 MED ORDER — ONDANSETRON HCL 4 MG PO TABS
4.0000 mg | ORAL_TABLET | Freq: Four times a day (QID) | ORAL | Status: DC | PRN
  Administered 2016-09-16: 4 mg via ORAL
  Filled 2016-09-13: qty 1

## 2016-09-13 MED ORDER — ACETAMINOPHEN 325 MG PO TABS
650.0000 mg | ORAL_TABLET | Freq: Four times a day (QID) | ORAL | Status: DC | PRN
  Filled 2016-09-13: qty 2

## 2016-09-13 MED ORDER — METOCLOPRAMIDE HCL 5 MG PO TABS
5.0000 mg | ORAL_TABLET | Freq: Three times a day (TID) | ORAL | Status: DC | PRN
  Administered 2016-09-16: 10 mg via ORAL
  Filled 2016-09-13: qty 2

## 2016-09-13 MED ORDER — 0.9 % SODIUM CHLORIDE (POUR BTL) OPTIME
TOPICAL | Status: DC | PRN
  Administered 2016-09-13: 1000 mL

## 2016-09-13 MED ORDER — ROPINIROLE HCL 1 MG PO TABS
3.0000 mg | ORAL_TABLET | Freq: Every day | ORAL | Status: DC
  Administered 2016-09-13 – 2016-09-15 (×3): 3 mg via ORAL
  Filled 2016-09-13: qty 6
  Filled 2016-09-13 (×3): qty 3

## 2016-09-13 MED ORDER — SODIUM CHLORIDE 0.9 % IV SOLN
INTRAVENOUS | Status: DC
  Administered 2016-09-14: 04:00:00 via INTRAVENOUS

## 2016-09-13 MED ORDER — PANTOPRAZOLE SODIUM 40 MG PO TBEC
40.0000 mg | DELAYED_RELEASE_TABLET | Freq: Every day | ORAL | Status: DC
  Administered 2016-09-14: 40 mg via ORAL
  Filled 2016-09-13: qty 1

## 2016-09-13 MED ORDER — CEFAZOLIN SODIUM-DEXTROSE 2-4 GM/100ML-% IV SOLN
INTRAVENOUS | Status: AC
  Filled 2016-09-13: qty 100

## 2016-09-13 MED ORDER — OXYCODONE HCL 5 MG PO TABS
ORAL_TABLET | ORAL | Status: AC
  Administered 2016-09-13: 10 mg
  Filled 2016-09-13: qty 2

## 2016-09-13 MED ORDER — FENTANYL CITRATE (PF) 100 MCG/2ML IJ SOLN
INTRAMUSCULAR | Status: AC
  Filled 2016-09-13: qty 4

## 2016-09-13 MED ORDER — ONDANSETRON HCL 4 MG/2ML IJ SOLN: 4.0000 mg | Freq: Four times a day (QID) | INTRAMUSCULAR | Status: DC | PRN

## 2016-09-13 MED ORDER — METOCLOPRAMIDE HCL 5 MG/ML IJ SOLN: 5.0000 mg | Freq: Three times a day (TID) | INTRAMUSCULAR | Status: DC | PRN

## 2016-09-13 MED ORDER — EPHEDRINE 5 MG/ML INJ
INTRAVENOUS | Status: AC
  Filled 2016-09-13: qty 10

## 2016-09-13 SURGICAL SUPPLY — 57 items
APL SKNCLS STERI-STRIP NONHPOA (GAUZE/BANDAGES/DRESSINGS) ×1
BENZOIN TINCTURE PRP APPL 2/3 (GAUZE/BANDAGES/DRESSINGS) ×3 IMPLANT
BLADE CLIPPER SURG (BLADE) IMPLANT
BLADE SAW SGTL 18X1.27X75 (BLADE) ×2 IMPLANT
BLADE SAW SGTL 18X1.27X75MM (BLADE) ×1
CAPT HIP TOTAL 2 ×2 IMPLANT
CELLS DAT CNTRL 66122 CELL SVR (MISCELLANEOUS) ×1 IMPLANT
CLOSURE WOUND 1/2 X4 (GAUZE/BANDAGES/DRESSINGS) ×1
COVER SURGICAL LIGHT HANDLE (MISCELLANEOUS) ×3 IMPLANT
DRAPE C-ARM 42X72 X-RAY (DRAPES) ×3 IMPLANT
DRAPE STERI IOBAN 125X83 (DRAPES) ×3 IMPLANT
DRAPE U-SHAPE 47X51 STRL (DRAPES) ×9 IMPLANT
DRSG AQUACEL AG ADV 3.5X10 (GAUZE/BANDAGES/DRESSINGS) ×3 IMPLANT
DURAPREP 26ML APPLICATOR (WOUND CARE) ×3 IMPLANT
ELECT BLADE 4.0 EZ CLEAN MEGAD (MISCELLANEOUS) ×3
ELECT BLADE 6.5 EXT (BLADE) IMPLANT
ELECT REM PT RETURN 9FT ADLT (ELECTROSURGICAL) ×3
ELECTRODE BLDE 4.0 EZ CLN MEGD (MISCELLANEOUS) ×1 IMPLANT
ELECTRODE REM PT RTRN 9FT ADLT (ELECTROSURGICAL) ×1 IMPLANT
FACESHIELD WRAPAROUND (MASK) ×6 IMPLANT
FACESHIELD WRAPAROUND OR TEAM (MASK) ×2 IMPLANT
GLOVE BIOGEL PI IND STRL 8 (GLOVE) ×2 IMPLANT
GLOVE BIOGEL PI INDICATOR 8 (GLOVE) ×4
GLOVE ECLIPSE 8.0 STRL XLNG CF (GLOVE) ×3 IMPLANT
GLOVE ORTHO TXT STRL SZ7.5 (GLOVE) ×8 IMPLANT
GLOVE SURG SS PI 6.5 STRL IVOR (GLOVE) ×2 IMPLANT
GOWN STRL REUS W/ TWL LRG LVL3 (GOWN DISPOSABLE) ×2 IMPLANT
GOWN STRL REUS W/ TWL XL LVL3 (GOWN DISPOSABLE) ×2 IMPLANT
GOWN STRL REUS W/TWL LRG LVL3 (GOWN DISPOSABLE) ×6
GOWN STRL REUS W/TWL XL LVL3 (GOWN DISPOSABLE) ×6
HANDPIECE INTERPULSE COAX TIP (DISPOSABLE) ×3
KIT BASIN OR (CUSTOM PROCEDURE TRAY) ×3 IMPLANT
KIT ROOM TURNOVER OR (KITS) ×3 IMPLANT
MANIFOLD NEPTUNE II (INSTRUMENTS) ×3 IMPLANT
NS IRRIG 1000ML POUR BTL (IV SOLUTION) ×3 IMPLANT
PACK PERIPHERAL VASCULAR (CUSTOM PROCEDURE TRAY) IMPLANT
PACK TOTAL JOINT (CUSTOM PROCEDURE TRAY) ×3 IMPLANT
PAD ARMBOARD 7.5X6 YLW CONV (MISCELLANEOUS) ×3 IMPLANT
RETRACTOR WND ALEXIS 18 MED (MISCELLANEOUS) ×1 IMPLANT
RTRCTR WOUND ALEXIS 18CM MED (MISCELLANEOUS) ×3
SET HNDPC FAN SPRY TIP SCT (DISPOSABLE) ×1 IMPLANT
STAPLER VISISTAT 35W (STAPLE) IMPLANT
STRIP CLOSURE SKIN 1/2X4 (GAUZE/BANDAGES/DRESSINGS) ×3 IMPLANT
SUT ETHIBOND NAB CT1 #1 30IN (SUTURE) ×3 IMPLANT
SUT MNCRL AB 3-0 PS2 18 (SUTURE) ×2 IMPLANT
SUT MNCRL AB 4-0 PS2 18 (SUTURE) IMPLANT
SUT VIC AB 0 CT1 27 (SUTURE) ×3
SUT VIC AB 0 CT1 27XBRD ANBCTR (SUTURE) ×1 IMPLANT
SUT VIC AB 1 CT1 27 (SUTURE) ×3
SUT VIC AB 1 CT1 27XBRD ANBCTR (SUTURE) ×1 IMPLANT
SUT VIC AB 2-0 CT1 27 (SUTURE) ×3
SUT VIC AB 2-0 CT1 TAPERPNT 27 (SUTURE) ×1 IMPLANT
TOWEL OR 17X24 6PK STRL BLUE (TOWEL DISPOSABLE) ×3 IMPLANT
TOWEL OR 17X26 10 PK STRL BLUE (TOWEL DISPOSABLE) ×3 IMPLANT
TRAY CATH 16FR W/PLASTIC CATH (SET/KITS/TRAYS/PACK) ×2 IMPLANT
TRAY FOLEY W/METER SILVER 16FR (SET/KITS/TRAYS/PACK) IMPLANT
WATER STERILE IRR 1000ML POUR (IV SOLUTION) ×6 IMPLANT

## 2016-09-13 NOTE — Transfer of Care (Signed)
Immediate Anesthesia Transfer of Care Note  Patient: Manuel Hunter  Procedure(s) Performed: Procedure(s): LEFT TOTAL HIP ARTHROPLASTY ANTERIOR APPROACH (Left)  Patient Location: PACU  Anesthesia Type:MAC and Spinal  Level of Consciousness: awake, alert  and patient cooperative  Airway & Oxygen Therapy: Patient Spontanous Breathing  Post-op Assessment: Report given to RN, Post -op Vital signs reviewed and stable and Patient moving all extremities  Post vital signs: Reviewed and stable  Last Vitals:  Vitals:   09/13/16 1213  BP: (!) 145/65  Pulse: 70  Resp: 20  Temp: 36.9 C    Last Pain:  Vitals:   09/13/16 1213  PainSc: 7       Patients Stated Pain Goal: 2 (81/10/31 5945)  Complications: No apparent anesthesia complications

## 2016-09-13 NOTE — Anesthesia Preprocedure Evaluation (Addendum)
Anesthesia Evaluation  Patient identified by MRN, date of birth, ID band Patient awake    Reviewed: Allergy & Precautions, NPO status , Patient's Chart, lab work & pertinent test results  Airway Mallampati: II  TM Distance: <3 FB Neck ROM: Limited    Dental  (+) Teeth Intact   Pulmonary sleep apnea ,    breath sounds clear to auscultation       Cardiovascular negative cardio ROS   Rhythm:Regular Rate:Normal     Neuro/Psych    GI/Hepatic Neg liver ROS, hiatal hernia, GERD  ,  Endo/Other  negative endocrine ROS  Renal/GU negative Renal ROS     Musculoskeletal  (+) Arthritis ,   Abdominal (+) + obese,   Peds  Hematology negative hematology ROS (+)   Anesthesia Other Findings   Reproductive/Obstetrics                            Anesthesia Physical Anesthesia Plan  ASA: II  Anesthesia Plan: Spinal   Post-op Pain Management:    Induction: Intravenous  Airway Management Planned: Simple Face Mask  Additional Equipment:   Intra-op Plan:   Post-operative Plan:   Informed Consent: I have reviewed the patients History and Physical, chart, labs and discussed the procedure including the risks, benefits and alternatives for the proposed anesthesia with the patient or authorized representative who has indicated his/her understanding and acceptance.     Plan Discussed with:   Anesthesia Plan Comments:         Anesthesia Quick Evaluation

## 2016-09-13 NOTE — Progress Notes (Signed)
Patient placed on CPAP auto mode. Patient tolerating well at this time. RT will continue to monitor.

## 2016-09-13 NOTE — H&P (Signed)
TOTAL HIP ADMISSION H&P  Patient is admitted for left total hip arthroplasty.  Subjective:  Chief Complaint: left hip pain  HPI: Manuel Hunter, 76 y.o. male, has a history of pain and functional disability in the left hip(s) due to arthritis and patient has failed non-surgical conservative treatments for greater than 12 weeks to include NSAID's and/or analgesics, corticosteriod injections, flexibility and strengthening excercises, use of assistive devices, weight reduction as appropriate and activity modification.  Onset of symptoms was abrupt starting 1 years ago with rapidlly worsening course since that time.The patient noted no past surgery on the left hip(s).  Patient currently rates pain in the left hip at 10 out of 10 with activity. Patient has night pain, worsening of pain with activity and weight bearing, trendelenberg gait, pain that interfers with activities of daily living, pain with passive range of motion, crepitus and joint swelling. Patient has evidence of subchondral sclerosis, periarticular osteophytes and joint space narrowing by imaging studies. This condition presents safety issues increasing the risk of falls.  There is no current active infection.  Patient Active Problem List   Diagnosis Date Noted  . Unilateral primary osteoarthritis, left hip 09/13/2016   Past Medical History:  Diagnosis Date  . Arthritis   . Complication of anesthesia    States "I woke up to soon one time"  . GERD (gastroesophageal reflux disease)   . Headache   . History of hiatal hernia   . Knee pain, left   . Prostate cancer ([place])   . RLS (restless legs syndrome)   . Sleep apnea     Past Surgical History:  Procedure Laterality Date  . KNEE SURGERY    . MANDIBLE FRACTURE SURGERY    . NECK SURGERY    . PROSTATE SURGERY    . ROTATOR CUFF REPAIR      Prescriptions Prior to Admission  Medication Sig Dispense Refill Last Dose  . methocarbamol (ROBAXIN) 500 MG tablet Take 500 mg by mouth  daily as needed for muscle spasms.   week  . naproxen sodium (ANAPROX) 220 MG tablet Take 440 mg by mouth daily as needed (pain).   week  . pantoprazole (PROTONIX) 40 MG tablet Take 40 mg by mouth daily.   09/13/2016 at Unknown time  . phenytoin (DILANTIN) 100 MG ER capsule Take 200 mg by mouth at bedtime.    09/12/2016 at Unknown time  . rOPINIRole (REQUIP) 1 MG tablet Take 1 mg by mouth 3 (three) times daily.   09/13/2016 at Unknown time  . rOPINIRole (REQUIP) 3 MG tablet Take 3 mg by mouth at bedtime.   09/12/2016 at Unknown time  . aspirin EC 81 MG tablet Take 81 mg by mouth daily.   More than a month at Unknown time  . zolpidem (AMBIEN) 10 MG tablet Take 10 mg by mouth at bedtime as needed for sleep.   Unknown at Unknown time   Allergies  Allergen Reactions  . Morphine Nausea And Vomiting    Social History  Substance Use Topics  . Smoking status: Never Smoker  . Smokeless tobacco: Never Used  . Alcohol use No    History reviewed. No pertinent family history.   Review of Systems  Musculoskeletal: Positive for joint pain.  All other systems reviewed and are negative.   Objective:  Physical Exam  Constitutional: He is oriented to person, place, and time. He appears well-developed and well-nourished.  HENT:  Head: Normocephalic and atraumatic.  Eyes: EOM are normal. Pupils are  equal, round, and reactive to light.  Neck: Normal range of motion. Neck supple.  Cardiovascular: Normal rate and regular rhythm.   Respiratory: Effort normal and breath sounds normal.  GI: Soft. Bowel sounds are normal.  Musculoskeletal:       Left hip: He exhibits decreased range of motion, decreased strength, tenderness and bony tenderness.  Neurological: He is alert and oriented to person, place, and time.  Skin: Skin is warm and dry.  Psychiatric: He has a normal mood and affect.    Vital signs in last 24 hours: Temp:  [98.5 F (36.9 C)] 98.5 F (36.9 C) (02/27 1213) Pulse Rate:  [70] 70 (02/27  1213) Resp:  [20] 20 (02/27 1213) BP: (145)/(65) 145/65 (02/27 1213) SpO2:  [98 %] 98 % (02/27 1213) Weight:  [233 lb 11.2 oz (106 kg)] 233 lb 11.2 oz (106 kg) (02/27 1215)  Labs:   Estimated body mass index is 37.72 kg/m as calculated from the following:   Height as of 09/06/16: 5\' 6"  (1.676 m).   Weight as of this encounter: 233 lb 11.2 oz (106 kg).   Imaging Review Plain radiographs demonstrate severe degenerative joint disease of the left hip(s). The bone quality appears to be excellent for age and reported activity level.  Assessment/Plan:  End stage arthritis, left hip(s)  The patient history, physical examination, clinical judgement of the provider and imaging studies are consistent with end stage degenerative joint disease of the left hip(s) and total hip arthroplasty is deemed medically necessary. The treatment options including medical management, injection therapy, arthroscopy and arthroplasty were discussed at length. The risks and benefits of total hip arthroplasty were presented and reviewed. The risks due to aseptic loosening, infection, stiffness, dislocation/subluxation,  thromboembolic complications and other imponderables were discussed.  The patient acknowledged the explanation, agreed to proceed with the plan and consent was signed. Patient is being admitted for inpatient treatment for surgery, pain control, PT, OT, prophylactic antibiotics, VTE prophylaxis, progressive ambulation and ADL's and discharge planning.The patient is planning to be discharged home with home health services

## 2016-09-13 NOTE — Anesthesia Procedure Notes (Signed)
Spinal  Patient location during procedure: OR Start time: 09/13/2016 3:10 PM End time: 09/13/2016 3:13 PM Staffing Anesthesiologist: Marcie Bal, ADAM Performed: anesthesiologist  Preanesthetic Checklist Completed: patient identified, site marked, surgical consent, pre-op evaluation, timeout performed, IV checked, risks and benefits discussed and monitors and equipment checked Spinal Block Patient position: sitting Prep: Betadine and site prepped and draped Patient monitoring: heart rate, cardiac monitor, continuous pulse ox and blood pressure Approach: midline Location: L2-3 Injection technique: single-shot Needle Needle type: Pencan  Needle gauge: 24 G Needle length: 9 cm Assessment Sensory level: T8 Additional Notes Pt tolerated the procedure well.

## 2016-09-13 NOTE — Brief Op Note (Signed)
09/13/2016  4:34 PM  PATIENT:  Manuel Hunter  76 y.o. male  PRE-OPERATIVE DIAGNOSIS:  left hip osteoarthritis  POST-OPERATIVE DIAGNOSIS:  left hip osteoarthritis  PROCEDURE:  Procedure(s): LEFT TOTAL HIP ARTHROPLASTY ANTERIOR APPROACH (Left)  SURGEON:  Surgeon(s) and Role:    * [name], MD - Primary  PHYSICIAN ASSISTANT: Benita Stabile, PA-C  ANESTHESIA:   spinal  EBL:  Total I/O In: -  Out: 250 [Blood:250]  COUNTS:  YES  DICTATION: .Other Dictation: Dictation Number [id number]  PLAN OF CARE: Admit to inpatient   PATIENT DISPOSITION:  PACU - hemodynamically stable.   Delay start of Pharmacological VTE agent (>24hrs) due to surgical blood loss or risk of bleeding: no

## 2016-09-14 ENCOUNTER — Encounter (HOSPITAL_COMMUNITY): Payer: Self-pay | Admitting: Orthopaedic Surgery

## 2016-09-14 LAB — CBC
HCT: 35.9 % — ABNORMAL LOW (ref 39.0–52.0)
Hemoglobin: 12.2 g/dL — ABNORMAL LOW (ref 13.0–17.0)
MCH: 31.4 pg (ref 26.0–34.0)
MCHC: 34 g/dL (ref 30.0–36.0)
MCV: 92.3 fL (ref 78.0–100.0)
PLATELETS: 189 10*3/uL (ref 150–400)
RBC: 3.89 MIL/uL — ABNORMAL LOW (ref 4.22–5.81)
RDW: 13.4 % (ref 11.5–15.5)
WBC: 9.4 10*3/uL (ref 4.0–10.5)

## 2016-09-14 LAB — BASIC METABOLIC PANEL
Anion gap: 8 (ref 5–15)
BUN: 17 mg/dL (ref 6–20)
CALCIUM: 8.5 mg/dL — AB (ref 8.9–10.3)
CO2: 29 mmol/L (ref 22–32)
CREATININE: 1.13 mg/dL (ref 0.61–1.24)
Chloride: 99 mmol/L — ABNORMAL LOW (ref 101–111)
Glucose, Bld: 161 mg/dL — ABNORMAL HIGH (ref 65–99)
Potassium: 4.2 mmol/L (ref 3.5–5.1)
SODIUM: 136 mmol/L (ref 135–145)

## 2016-09-14 MED ORDER — PANTOPRAZOLE SODIUM 40 MG PO TBEC
40.0000 mg | DELAYED_RELEASE_TABLET | Freq: Every day | ORAL | Status: DC
  Administered 2016-09-15 – 2016-09-16 (×2): 40 mg via ORAL
  Filled 2016-09-14 (×2): qty 1

## 2016-09-14 MED ORDER — ORAL CARE MOUTH RINSE
15.0000 mL | Freq: Two times a day (BID) | OROMUCOSAL | Status: DC
  Administered 2016-09-14 – 2016-09-15 (×3): 15 mL via OROMUCOSAL

## 2016-09-14 MED ORDER — CHLORHEXIDINE GLUCONATE 0.12 % MT SOLN
15.0000 mL | Freq: Two times a day (BID) | OROMUCOSAL | Status: DC
  Administered 2016-09-14 – 2016-09-15 (×4): 15 mL via OROMUCOSAL
  Filled 2016-09-14 (×5): qty 15

## 2016-09-14 NOTE — Anesthesia Postprocedure Evaluation (Signed)
Anesthesia Post Note  Patient: Manuel Hunter  Procedure(s) Performed: Procedure(s) (LRB): LEFT TOTAL HIP ARTHROPLASTY ANTERIOR APPROACH (Left)  Patient location during evaluation: PACU Anesthesia Type: Spinal Level of consciousness: oriented and awake and alert Pain management: pain level controlled Vital Signs Assessment: post-procedure vital signs reviewed and stable Respiratory status: spontaneous breathing, respiratory function stable and patient connected to nasal cannula oxygen Cardiovascular status: blood pressure returned to baseline and stable Postop Assessment: no headache and no backache Anesthetic complications: no       Last Vitals:  Vitals:   09/14/16 0015 09/14/16 0400  BP: 110/77 130/64  Pulse: 74 89  Resp: 16 18  Temp: 37.1 C 37.4 C    Last Pain:  Vitals:   09/14/16 0426  TempSrc:   PainSc: [name]

## 2016-09-14 NOTE — Progress Notes (Signed)
Pt. transported from PACU via bed to 5N-05; alert and oriented x4; wife with pt.- both oriented to room, and reviewed orders/POC.

## 2016-09-14 NOTE — Evaluation (Signed)
Physical Therapy Evaluation Patient Details Name: Manuel Hunter MRN: [id number] DOB: 07/08/1941 Today's Date: 09/14/2016   History of Present Illness  Pt is 76 y.o. male admitted for L THA; WBAT with direct anterior approach.  PMH pertinent for arthritis, L knee pain, prostate cancer ([place]), restless leg syndrome, and sleep apnea. Past surgical hx includes RC repair, neck sx, knee sx, and prostate sx.  Clinical Impression  Pt is s/p left direct anterior THA resulting in the deficits listed below (see PT problem list). Pt pleasant and motivated to participate with PT, able to amb 250' with RW and min guard, and ascend/descend 3 stairs with bilat rails and min guard. Pt will be returning home where he lives with wife who can provide 24/7 supervision. Pt would benefit from continued acute PT services to maximize functional mobility and independence.     Follow Up Recommendations Home health PT    Equipment Recommendations  None recommended by PT    Recommendations for Other Services       Precautions / Restrictions Precautions Precautions: None Restrictions Weight Bearing Restrictions: Yes LLE Weight Bearing: Weight bearing as tolerated      Mobility  Bed Mobility Overal bed mobility: Needs Assistance Bed Mobility: Supine to Sit     Supine to sit: HOB elevated;Min assist     General bed mobility comments: Verbal cues for sequencing to sit EOB. MinA for trunk control.   Transfers Overall transfer level: Needs assistance Equipment used: Rolling walker (2 wheeled) Transfers: Sit to/from Stand Sit to Stand: Min guard         General transfer comment: Verbal cues for hand placement on RW.   Ambulation/Gait   Ambulation Distance (Feet): 250 Feet Assistive device: Rolling walker (2 wheeled) Gait Pattern/deviations: Step-through pattern;Decreased step length - right;Decreased step length - left;Decreased stride length;Decreased stance time - left Gait velocity:  Decreased Gait velocity interpretation: Below normal speed for age/gender General Gait Details: Slow, guarded gait s/p surgery with forward flexed posture. Required verbal cues to stand fully upright and walk heel-to-toe with LLE; pt able to maintain this for short periods of time.   Stairs Stairs: Yes Stairs assistance: Min guard Stair Management: Two rails;Step to pattern Number of Stairs: 3 General stair comments: Cues for sequencing  Wheelchair Mobility    Modified Rankin (Stroke Patients Only)       Balance Overall balance assessment: Needs assistance Sitting-balance support: No upper extremity supported;Feet supported Sitting balance-Leahy Scale: Good     Standing balance support: Bilateral upper extremity supported;During functional activity Standing balance-Leahy Scale: Poor                               Pertinent Vitals/Pain Pain Assessment: 0-10 Pain Score: 4  Pain Location: L hip Pain Descriptors / Indicators: Burning Pain Intervention(s): Limited activity within patient's tolerance;Monitored during session;Repositioned    Home Living Family/patient expects to be discharged to:: Private residence Living Arrangements: Spouse/significant other Available Help at Discharge: Family;Available 24 hours/day (Wife) Type of Home: House Home Access: Stairs to enter Entrance Stairs-Rails: Can reach both Entrance Stairs-Number of Steps: 2 Home Layout: One level Home Equipment: Bedside commode;Walker - 2 wheels;Cane - single point;Grab bars - tub/shower      Prior Function Level of Independence: Independent               Hand Dominance        Extremity/Trunk Assessment   Upper Extremity Assessment Upper  Extremity Assessment: Defer to OT evaluation    Lower Extremity Assessment Lower Extremity Assessment: Generalized weakness    Cervical / Trunk Assessment Cervical / Trunk Assessment: Kyphotic  Communication   Communication: No  difficulties  Cognition Arousal/Alertness: Awake/alert Behavior During Therapy: WFL for tasks assessed/performed Overall Cognitive Status: Within Functional Limits for tasks assessed                      General Comments      Exercises General Exercises - Lower Extremity Quad Sets: Strengthening;Both;5 reps Gluteal Sets: Strengthening;Both;5 reps Heel Slides: AROM;Both;5 reps Hip ABduction/ADduction: Strengthening;Both;5 reps   Assessment/Plan    PT Assessment Patient needs continued PT services  PT Problem List Decreased strength;Decreased range of motion;Decreased activity tolerance;Decreased balance;Decreased mobility;Decreased knowledge of use of DME;Pain       PT Treatment Interventions DME instruction;Gait training;Stair training;Functional mobility training;Therapeutic activities;Therapeutic exercise;Balance training;Patient/family education    PT Goals (Current goals can be found in the Care Plan section)  Acute Rehab PT Goals Patient Stated Goal: Return home PT Goal Formulation: With patient Time For Goal Achievement: 09/28/16 Potential to Achieve Goals: Good    Frequency 7X/week   Barriers to discharge        Co-evaluation               End of Session Equipment Utilized During Treatment: Gait belt Activity Tolerance: Patient tolerated treatment well Patient left: in chair;with call bell/phone within reach Nurse Communication: Mobility status PT Visit Diagnosis: Muscle weakness (generalized) (M62.81);Unsteadiness on feet (R26.81)         Time: [id number] PT Time Calculation (min) (ACUTE ONLY): 38 min   Charges:   PT Evaluation $PT Eval Low Complexity: 1 Procedure PT Treatments $Gait Training: 8-22 mins $Therapeutic Activity: 8-22 mins   PT G Codes:       Manuel Hunter, SPT Office-[phone]  Manuel Hunter 09/14/2016, 3:38 PM

## 2016-09-14 NOTE — Progress Notes (Signed)
Physical Therapy Treatment Patient Details Name: Manuel Hunter MRN: [id number] DOB: 14-Feb-1941 Today's Date: 09/14/2016    History of Present Illness Pt is 76 y.o. male admitted for L THA; WBAT with direct anterior approach.  PMH pertinent for arthritis, L knee pain, prostate cancer ([place]), restless leg syndrome, and sleep apnea. Past surgical hx includes RC repair, neck sx, knee sx, and prostate sx.    PT Comments    Pt performed gait, stair training and HEP.  PTA issued HEP.  Pt's wife reports he does not have rails at home so patient will likely need stair training to address this method.     Follow Up Recommendations  Home health PT     Equipment Recommendations  None recommended by PT    Recommendations for Other Services       Precautions / Restrictions Precautions Precautions: None Restrictions Weight Bearing Restrictions: Yes LLE Weight Bearing: Weight bearing as tolerated    Mobility  Bed Mobility Overal bed mobility: Needs Assistance Bed Mobility: Supine to Sit;Sit to Supine     Supine to sit: Min assist Sit to supine: Supervision   General bed mobility comments: Cues for hand placement and cueing to use nonsurgical limb to lift surgical limb against gravity.  Pt educated to use the technique short term and encouraged him to try to advance surgical limb with out assist as he progresses.    Transfers Overall transfer level: Needs assistance Equipment used: Rolling walker (2 wheeled) Transfers: Sit to/from Stand Sit to Stand: Min guard         General transfer comment: Pt impulsive to stand due to pain.  Pt required cues for safety.  Grimmacing observed during transfers.    Ambulation/Gait Ambulation/Gait assistance: Min guard Ambulation Distance (Feet): 300 Feet Assistive device: Rolling walker (2 wheeled) Gait Pattern/deviations: Step-through pattern;Decreased step length - right;Decreased step length - left;Decreased stride length;Decreased stance  time - left Gait velocity: Decreased Gait velocity interpretation: Below normal speed for age/gender General Gait Details: Cues for upper trunk control, gait symmetry and RW placement.     Stairs Stairs: Yes   Stair Management: Two rails;Step to pattern Number of Stairs: 3 General stair comments: Cues for sequencing and RW placement.    Wheelchair Mobility    Modified Rankin (Stroke Patients Only)       Balance Overall balance assessment: Needs assistance Sitting-balance support: No upper extremity supported;Feet supported Sitting balance-Leahy Scale: Good     Standing balance support: Bilateral upper extremity supported;During functional activity Standing balance-Leahy Scale: Fair                      Cognition Arousal/Alertness: Awake/alert Behavior During Therapy: WFL for tasks assessed/performed Overall Cognitive Status: Within Functional Limits for tasks assessed                      Exercises Total Joint Exercises Ankle Circles/Pumps: AROM;Both;10 reps;Supine Quad Sets: AROM;Left;10 reps;Supine Short Arc Quad: AROM;Left;10 reps;Supine Heel Slides: AROM;Left;10 reps;Supine Hip ABduction/ADduction: AROM;Left;20 reps;Supine;Standing (1x10 in supine and 1x10 in standing.  ) Straight Leg Raises: AROM;Left;10 reps;Supine Long Arc Quad: AROM;Left;10 reps;Seated Knee Flexion: AROM;Left;10 reps;Standing Marching in Standing: AROM;Left;10 reps;Standing Standing Hip Extension: AROM;Left;10 reps;Standing General Exercises - Lower Extremity Quad Sets: Strengthening;Both;5 reps Gluteal Sets: Strengthening;Both;5 reps Heel Slides: AROM;Both;5 reps Hip ABduction/ADduction: Strengthening;Both;5 reps    General Comments        Pertinent Vitals/Pain Pain Assessment: 0-10 Pain Score: 5  Pain Location: L hip  Pain Descriptors / Indicators: Burning Pain Intervention(s): Monitored during session;Repositioned;Ice applied    Home Living                       Prior Function            PT Goals (current goals can now be found in the care plan section) Acute Rehab PT Goals Patient Stated Goal: Return home PT Goal Formulation: With patient Time For Goal Achievement: 09/28/16 Potential to Achieve Goals: Good Progress towards PT goals: Progressing toward goals    Frequency    7X/week      PT Plan Current plan remains appropriate    Co-evaluation             End of Session Equipment Utilized During Treatment: Gait belt Activity Tolerance: Patient tolerated treatment well Patient left: in chair;with call bell/phone within reach Nurse Communication: Mobility status PT Visit Diagnosis: Muscle weakness (generalized) (M62.81);Unsteadiness on feet (R26.81)     Time: [id number] PT Time Calculation (min) (ACUTE ONLY): 39 min  Charges:  $Gait Training: 8-22 mins $Therapeutic Exercise: 8-22 mins $Therapeutic Activity: 8-22 mins                    G Codes:       Cristela Blue 09-26-16, 5:21 PM Governor Rooks, PTA pager [phone]

## 2016-09-14 NOTE — Progress Notes (Signed)
Subjective: 1 Day Post-Op Procedure(s) (LRB): LEFT TOTAL HIP ARTHROPLASTY ANTERIOR APPROACH (Left) Patient reports pain as moderate.    Objective: Vital signs in last 24 hours: Temp:  [98.1 F (36.7 C)-99.3 F (37.4 C)] 99.3 F (37.4 C) (02/28 0400) Pulse Rate:  [60-89] 89 (02/28 0400) Resp:  [16-20] 18 (02/28 0400) BP: (110-168)/(45-90) 130/64 (02/28 0400) SpO2:  [96 %-99 %] 96 % (02/28 0400) Weight:  [233 lb 11.2 oz (106 kg)] 233 lb 11.2 oz (106 kg) (02/27 1215)  Intake/Output from previous day: 02/27 0701 - 02/28 0700 In: 1000 [I.V.:1000] Out: 650 [Urine:400; Blood:250] Intake/Output this shift: No intake/output data recorded.   Recent Labs  09/14/16 0443  HGB 12.2*    Recent Labs  09/14/16 0443  WBC 9.4  RBC 3.89*  HCT 35.9*  PLT 189    Recent Labs  09/14/16 0443  NA 136  K 4.2  CL 99*  CO2 29  BUN 17  CREATININE 1.13  GLUCOSE 161*  CALCIUM 8.5*   No results for input(s): LABPT, INR in the last 72 hours.  Sensation intact distally Intact pulses distally Dorsiflexion/Plantar flexion intact Incision: dressing C/D/I  Assessment/Plan: 1 Day Post-Op Procedure(s) (LRB): LEFT TOTAL HIP ARTHROPLASTY ANTERIOR APPROACH (Left) Up with therapy  [name] 09/14/2016, 6:59 AM

## 2016-09-14 NOTE — Care Management Note (Signed)
Case Management Note  Patient Details  Name: Manuel Hunter MRN: [id number] Date of Birth: 27-Mar-1941  Subjective/Objective: 76 yr old gentleman, s/p left total hip arthroplasty, anterior approach.                   Action/Plan: Case manager spoke with patient's wife( patient was peacefully sleeping), concerning Home health and DME needs. Patient was preoperatively setup with [facility], no changes. Patient's wife stated that Dr. Ninfa Linden had said patient may not need HHPT. CM will confirm. Patient has rolling walker and 3in1. Will have family support at discharge.    Expected Discharge Date:    09/15/16              Expected Discharge Plan:  [place]  In-House Referral:  NA  Discharge planning Services  CM Consult  Post Acute Care Choice:  Home Health Choice offered to:  Patient  DME Arranged:  N/A DME Agency:  NA  HH Arranged:  PT [place] Agency:  [facility] (formerly [employer])  Status of Service:  Completed, signed off  If discussed at [employer], dates discussed:    Additional Comments:  Ninfa Meeker, RN 09/14/2016, 2:40 PM

## 2016-09-14 NOTE — Op Note (Signed)
NAME:  TREYVAN, PUGLIANO NO.:  [id number]  MEDICAL RECORD NO.:  [id number]  LOCATION:                                 FACILITY:  PHYSICIAN:  Lind Guest. Ninfa Linden, M.D.DATE OF BIRTH:  12-06-1940  DATE OF PROCEDURE:  09/13/2016 DATE OF DISCHARGE:                              OPERATIVE REPORT   PREOPERATIVE DIAGNOSIS:  Primary osteoarthritis and degenerative joint disease, left hip.  POSTOPERATIVE DIAGNOSIS:  Primary osteoarthritis and degenerative joint disease, left hip.  PROCEDURE:  Left total hip arthroplasty through direct anterior approach.  IMPLANTS:  DePuy Sector Gription acetabular component size 54 with 2 screws, size 36+ 0 neutral polyethylene liner, size 12 Corail femoral component with varus offset, size 36+ 5 ceramic hip ball.  SURGEON:  Lind Guest. Ninfa Linden, M.D.  ASSISTANT:  Erskine Emery, PA-C.  ANESTHESIA:  Spinal.  ANTIBIOTICS:  2 g of IV Ancef.  BLOOD LOSS:  250 mL.  COMPLICATIONS:  None.  INDICATIONS:  Mr. Manuel Hunter is a 76 year old gentleman with rapidly progressive and worsening left hip pain due to arthritis.  Less than a year ago he started developing hip pain and then it got quite severe in his groin.  He has x-rays that show significant narrowing of the joint space and periarticular osteophytes with some flattening of the femoral head.  He shows signs of femoral acetabular impingement as well.  His pain is daily and it has been quite severe.  It has detrimentally affected his activities of daily living, his quality of life, his mobility.  Due to the rapid progression of his disease and the detrimental impact that this has had on him he does wish to proceed with hip replacement.  He understands fully the risks of acute blood loss anemia, nerve and vessel injury, fracture, infection, dislocation, DVT. He understands our goals are decreased pain, improved mobility, and overall improved quality of life.  PROCEDURE  DESCRIPTION:  After informed consent was obtained, appropriate left hip was marked.  He was brought to the operating room, and spinal anesthesia was obtained while he was on a stretcher.  He was then laid in a supine position on the stretcher.  Traction boots were placed on both his feet.  Next, he was placed supine on the Hana fracture table with perineal post in place and both legs in inline skeletal traction devices, but no traction applied.  His left operative hip was then prepped and draped with DuraPrep and sterile drapes.  Time-out was called.  He was identified as correct patient and correct left hip.  We then made an incision just inferior and posterior to the anterosuperior iliac spine and carried this obliquely down the leg.  We dissected down tensor fascia lata muscle and tensor fascia was then divided longitudinally to proceed with a direct anterior approach to the hip. We identified and cauterized the circumflex vessels and identified the hip capsule.  We opened up the hip capsule in an L-type format finding a large joint effusion and significant periarticular osteophytes.  We placed Cobra retractors around the medial and lateral femoral neck and then made our femoral neck cut with an oscillating saw proximal to the lesser trochanter and completed this  with an osteotome.  We placed a corkscrew guide in the femoral head and removed the femoral head in its entirety and found a large section of the superolateral aspect devoid of cartilage and consistent with significant osteoarthritis.  We then removed remnants of acetabular labrum from the acetabulum and placed a bent Hohmann over the medial acetabular rim.  We then began reaming from a size 42 reamer in stepwise increments up to a size 54 with all reamers under direct visualization.  The last reamer under direct fluoroscopy, so we could obtain our depth of reaming, our inclination, and anteversion.  Once we were pleased with  this, we placed the real DePuy Sector Gription acetabular component size 54 and 2 screws.  We placed the 36+ 0 neutral polyethylene liner for that size 54 acetabular component.  Attention was then turned to the femur with the leg externally rotated to 120 degrees, extended and adducted, we were to place a Mueller retractor medially and a Hohmann retractor behind the greater trochanter.  We released the lateral joint capsule and used a box cutting osteotome in the inner femoral canal and a rongeur to lateralize.  We then began broaching using a size 8 broach using the Corail broaching system from DePuy and went all the way to a size 12. With a 12 in place, we trialed a varus offset femoral neck and a 36+ 1.5 hip ball rolled back over and up with traction and rotation reducing the pelvis.  I felt like we needed just a little bit more offset and leg length.  We dislocated the hip and removed the trial components.  We were able to place the real Corail femoral component with varus offset, a size 12 and the real 36+ 5 hip ball reduced this in the acetabulum. We were pleased with stability, offset, and range of motion.  We then irrigated the soft tissue with normal saline solution using pulsatile lavage.  We were able to close the joint capsule with interrupted #1 Ethibond suture followed by running #1 Vicryl in the tensor fascia, 0 Vicryl in the deep tissue, 2-0 Vicryl in the subcutaneous tissue, 4-0 Monocryl subcuticular stitch and Steri-Strips on the skin.  Steri-Strips and Aquacel dressing was applied.  He was taken off the Hana table and taken to the recovery room in stable condition.  All final counts were correct.  There were no complications noted.  Of note, Erskine Emery, PAC assisted in the entire case.  His assistance was crucial for facilitating all aspects of this case.     Lind Guest. Ninfa Linden, M.D.   ______________________________ Lind Guest. Ninfa Linden,  M.D.    CYB/MEDQ  D:  09/13/2016  T:  09/13/2016  Job:  [id number]

## 2016-09-14 NOTE — Progress Notes (Signed)
Patient stated he is able to place himself on CPAP when ready. Water chamber is filled with sterile water. RT informed patient if he has any problem have RN contact RT.

## 2016-09-15 MED ORDER — OXYCODONE-ACETAMINOPHEN 5-325 MG PO TABS: 1.0000 | ORAL_TABLET | ORAL | 0 refills | Status: DC | PRN

## 2016-09-15 MED ORDER — METHOCARBAMOL 500 MG PO TABS: 500.0000 mg | ORAL_TABLET | Freq: Four times a day (QID) | ORAL | 0 refills | Status: DC | PRN

## 2016-09-15 MED ORDER — ONDANSETRON 4 MG PO TBDP: 4.0000 mg | ORAL_TABLET | Freq: Four times a day (QID) | ORAL | 0 refills | Status: DC | PRN

## 2016-09-15 MED ORDER — ASPIRIN EC 81 MG PO TBEC: 81.0000 mg | DELAYED_RELEASE_TABLET | Freq: Two times a day (BID) | ORAL | 0 refills | Status: DC

## 2016-09-15 NOTE — Progress Notes (Addendum)
Pt. Places himself on cpap when ready.

## 2016-09-15 NOTE — Discharge Instructions (Signed)

## 2016-09-15 NOTE — Progress Notes (Signed)
Physical Therapy Treatment Patient Details Name: Manuel Hunter MRN: [id number] DOB: 28-Jul-1940 Today's Date: 09/15/2016    History of Present Illness Pt is 76 y.o. male admitted for L THA; WBAT with direct anterior approach.  PMH pertinent for arthritis, L knee pain, prostate cancer ([place]), restless leg syndrome, and sleep apnea. Past surgical hx includes RC repair, neck sx, knee sx, and prostate sx.    PT Comments    Patient is progressing toward mobility goals. Practiced steps this session with wife assisting. Continue to progress as tolerated with anticipated d/c home with HHPT.    Follow Up Recommendations  Home health PT     Equipment Recommendations  None recommended by PT    Recommendations for Other Services       Precautions / Restrictions Precautions Precautions: None Restrictions Weight Bearing Restrictions: Yes LLE Weight Bearing: Weight bearing as tolerated    Mobility  Bed Mobility Overal bed mobility: Needs Assistance Bed Mobility: Supine to Sit;Sit to Supine     Supine to sit: Min guard     General bed mobility comments: cues for sequencing/technique; increased time/effort and use of rails  Transfers Overall transfer level: Needs assistance Equipment used: Rolling walker (2 wheeled) Transfers: Sit to/from Stand Sit to Stand: Min guard         General transfer comment: pt continues to be impulsive when standing; cues for safe hand placement   Ambulation/Gait Ambulation/Gait assistance: Min guard Ambulation Distance (Feet): 160 Feet Assistive device: Rolling walker (2 wheeled) Gait Pattern/deviations: Step-through pattern;Decreased stride length;Decreased weight shift to left;Trunk flexed Gait velocity: Decreased   General Gait Details: cues for posture and safe use of AD   Stairs     Stair Management: Step to pattern;No rails;Backwards;With walker Number of Stairs: 2 General stair comments: cues for sequencing and technique; wife  stabilized RW; pt with carry over demonstrated of sequencing   Wheelchair Mobility    Modified Rankin (Stroke Patients Only)       Balance Overall balance assessment: Needs assistance Sitting-balance support: No upper extremity supported;Feet supported Sitting balance-Leahy Scale: Good     Standing balance support: Bilateral upper extremity supported;During functional activity Standing balance-Leahy Scale: Fair Standing balance comment: no LOB while performing grooming standing at sink                    Cognition Arousal/Alertness: Awake/alert Behavior During Therapy: WFL for tasks assessed/performed Overall Cognitive Status: Within Functional Limits for tasks assessed                      Exercises      General Comments        Pertinent Vitals/Pain Pain Assessment: Faces Pain Score: 5  Faces Pain Scale: Hurts little more Pain Location: L hip  Pain Descriptors / Indicators: Burning;Sore Pain Intervention(s): Monitored during session;Premedicated before session;Repositioned    Home Living Family/patient expects to be discharged to:: Private residence Living Arrangements: Spouse/significant other Available Help at Discharge: Family;Available 24 hours/day Type of Home: House Home Access: Stairs to enter Entrance Stairs-Rails: Can reach both Home Layout: One level Home Equipment: Bedside commode;Walker - 2 wheels;Cane - single point;Grab bars - tub/shower      Prior Function Level of Independence: Independent          PT Goals (current goals can now be found in the care plan section) Acute Rehab PT Goals Patient Stated Goal: Return home Progress towards PT goals: Progressing toward goals    Frequency  7X/week      PT Plan Current plan remains appropriate    Co-evaluation             End of Session Equipment Utilized During Treatment: Gait belt Activity Tolerance: Patient tolerated treatment well Patient left: in chair;with  call bell/phone within reach;with family/visitor present Nurse Communication: Mobility status PT Visit Diagnosis: Muscle weakness (generalized) (M62.81);Unsteadiness on feet (R26.81)     Time: [id number] PT Time Calculation (min) (ACUTE ONLY): 17 min  Charges:  $Gait Training: 8-22 mins                    G Codes:       [place] April, PTA Pager: [phone]   09/15/2016, 2:06 PM

## 2016-09-15 NOTE — Progress Notes (Signed)
Physical Therapy Treatment Patient Details Name: Manuel Hunter MRN: [id number] DOB: [birth year] Today's Date: 09/15/2016    History of Present Illness Pt is 76 y.o. male admitted for L THA; WBAT with direct anterior approach.  PMH pertinent for arthritis, L knee pain, prostate cancer ([place]), restless leg syndrome, and sleep apnea. Past surgical hx includes RC repair, neck sx, knee sx, and prostate sx.    PT Comments    Patient tolerated HEP this session and with less c/o pain. Continue to progress as tolerated with anticipated d/c home with HHPT.    Follow Up Recommendations  Home health PT     Equipment Recommendations  None recommended by PT    Recommendations for Other Services       Precautions / Restrictions Precautions Precautions: None Restrictions Weight Bearing Restrictions: Yes LLE Weight Bearing: Weight bearing as tolerated    Mobility  Bed Mobility Overal bed mobility: Needs Assistance Bed Mobility: Supine to Sit;Sit to Supine     Supine to sit: Min guard     General bed mobility comments: OOB in chair upon arrival  Transfers Overall transfer level: Needs assistance Equipment used: Rolling walker (2 wheeled) Transfers: Sit to/from Stand Sit to Stand: Supervision         General transfer comment: safe hand placement  Ambulation/Gait Ambulation/Gait assistance: Supervision Ambulation Distance (Feet): 6 Feet Assistive device: Rolling walker (2 wheeled) Gait Pattern/deviations: Step-through pattern;Decreased stride length;Trunk flexed Gait velocity: Decreased   General Gait Details: cues for posture; good WB L LE   Stairs     Stair Management: Step to pattern;No rails;Backwards;With walker Number of Stairs: 2 General stair comments: cues for sequencing and technique; wife stabilized RW; pt with carry over demonstrated of sequencing   Wheelchair Mobility    Modified Rankin (Stroke Patients Only)       Balance     Sitting  balance-Leahy Scale: Good       Standing balance-Leahy Scale: Fair                      Cognition Arousal/Alertness: Awake/alert Behavior During Therapy: WFL for tasks assessed/performed Overall Cognitive Status: Within Functional Limits for tasks assessed                      Exercises Total Joint Exercises Quad Sets: AROM;Left;10 reps Short Arc Quad: AROM;Left;10 reps;AAROM Heel Slides: AROM;Left;10 reps;AAROM Hip ABduction/ADduction: AROM;Left;10 reps Long Arc Quad: AROM;Left;10 reps Knee Flexion: AROM;Left;10 reps;Standing Marching in Standing: AROM;Left;10 reps;Standing Standing Hip Extension: AROM;Left;10 reps;Standing    General Comments        Pertinent Vitals/Pain Pain Assessment: Faces Faces Pain Scale: Hurts little more Pain Location: L hip  Pain Descriptors / Indicators: Sore;Grimacing Pain Intervention(s): Monitored during session;Premedicated before session;Repositioned    Home Living                      Prior Function            PT Goals (current goals can now be found in the care plan section) Acute Rehab PT Goals Patient Stated Goal: Return home Progress towards PT goals: Progressing toward goals    Frequency    7X/week      PT Plan Current plan remains appropriate    Co-evaluation             End of Session Equipment Utilized During Treatment: Gait belt Activity Tolerance: Patient tolerated treatment well Patient left: in chair;with call  bell/phone within reach;with family/visitor present Nurse Communication: Mobility status PT Visit Diagnosis: Muscle weakness (generalized) (M62.81);Unsteadiness on feet (R26.81)     Time: [id number] PT Time Calculation (min) (ACUTE ONLY): 25 min  Charges:   $Therapeutic Exercise: 23-37 mins                    G Codes:       [place] April, PTA Pager: [phone]   09/15/2016, 4:46 PM

## 2016-09-15 NOTE — Progress Notes (Signed)
Subjective: 2 Days Post-Op Procedure(s) (LRB): LEFT TOTAL HIP ARTHROPLASTY ANTERIOR APPROACH (Left) Patient reports pain as moderate.    Objective: Vital signs in last 24 hours: Temp:  [98.1 F (36.7 C)-100.4 F (38 C)] 98.9 F (37.2 C) (03/01 0700) Pulse Rate:  [86-92] 89 (03/01 0700) Resp:  [18] 18 (02/28 2001) BP: (125-132)/(54-63) 125/63 (03/01 0700) SpO2:  [97 %-98 %] 97 % (03/01 0700)  Intake/Output from previous day: 02/28 0701 - 03/01 0700 In: 720 [P.O.:720] Out: -  Intake/Output this shift: No intake/output data recorded.   Recent Labs  09/14/16 0443  HGB 12.2*    Recent Labs  09/14/16 0443  WBC 9.4  RBC 3.89*  HCT 35.9*  PLT 189    Recent Labs  09/14/16 0443  NA 136  K 4.2  CL 99*  CO2 29  BUN 17  CREATININE 1.13  GLUCOSE 161*  CALCIUM 8.5*   No results for input(s): LABPT, INR in the last 72 hours.  Sensation intact distally Intact pulses distally Dorsiflexion/Plantar flexion intact Incision: scant drainage  Assessment/Plan: 2 Days Post-Op Procedure(s) (LRB): LEFT TOTAL HIP ARTHROPLASTY ANTERIOR APPROACH (Left) Up with therapy Plan for discharge tomorrow Discharge home with home health  Manuel Hunter 09/15/2016, 11:37 AM

## 2016-09-15 NOTE — Evaluation (Signed)
Occupational Therapy Evaluation and Discharge Patient Details Name: Manuel Hunter MRN: [id number] DOB: [birth year] Today's Date: 09/15/2016    History of Present Illness Pt is 76 y.o. male admitted for L THA; WBAT with direct anterior approach.  PMH pertinent for arthritis, L knee pain, prostate cancer ([place]), restless leg syndrome, and sleep apnea. Past surgical hx includes RC repair, neck sx, knee sx, and prostate sx.   Clinical Impression   PTA Pt independent in ADL and mobility. Pt currently min A for LB ADL and supervision for mobility with RW. Pt very pleasant and willing to participate in therapy session. Pt fully assessed and education complete in compensatory strategies and safety prior to dc home with family support/assitance. Pt with no questions or concerns at the end of session, OT to sign off at this point - Thank you for this referral.     Follow Up Recommendations  No OT follow up;Supervision/Assistance - 24 hour (initially)    Equipment Recommendations  None recommended by OT (Pt has appropriate DME)    Recommendations for Other Services       Precautions / Restrictions Precautions Precautions: None Restrictions Weight Bearing Restrictions: Yes LLE Weight Bearing: Weight bearing as tolerated      Mobility Bed Mobility               General bed mobility comments: Pt sitting OOB in recliner when OT arrived in room  Transfers Overall transfer level: Needs assistance Equipment used: Rolling walker (2 wheeled) Transfers: Sit to/from Stand Sit to Stand: Min guard         General transfer comment: good hand placement    Balance Overall balance assessment: Needs assistance Sitting-balance support: No upper extremity supported;Feet supported Sitting balance-Leahy Scale: Good     Standing balance support: Bilateral upper extremity supported;During functional activity Standing balance-Leahy Scale: Fair Standing balance comment: no LOB while performing  grooming standing at sink                            ADL Overall ADL's : Needs assistance/impaired Eating/Feeding: Sitting;Modified independent   Grooming: Wash/dry hands;Wash/dry face;Oral care;Applying deodorant;Brushing hair;Supervision/safety;Standing Grooming Details (indicate cue type and reason): sink level, also shaved Upper Body Bathing: Supervision/ safety;Standing   Lower Body Bathing: Minimal assistance;With caregiver independent assisting;Cueing for compensatory techniques Lower Body Bathing Details (indicate cue type and reason): Pt states that he plans to stand in the shower at home, but has a seat in the shower he can sit on if he needs to, edcuated in long handle sponge Upper Body Dressing : Modified independent;Standing Upper Body Dressing Details (indicate cue type and reason): donned shirt Lower Body Dressing: Min guard;Cueing for sequencing;Sit to/from stand Lower Body Dressing Details (indicate cue type and reason): Pt states that he donned pants by himself in the bathroom sit to stand from comfort height toilet Toilet Transfer: Min guard;Ambulation;Comfort height toilet;RW Armed forces technical officer Details (indicate cue type and reason): good hand placement     Tub/ Shower Transfer: Walk-in shower;Min guard;With caregiver independent assisting;Ambulation;Rolling walker Tub/Shower Transfer Details (indicate cue type and reason): Educated Pt on having supervision for safety in shower Functional mobility during ADLs: Supervision/safety;Rolling walker       Vision Baseline Vision/History: Wears glasses Wears Glasses: Reading only Patient Visual Report: No change from baseline Vision Assessment?: No apparent visual deficits     Perception     Praxis      Pertinent Vitals/Pain Pain Assessment: 0-10 Pain  Score: 5  Pain Location: L hip  Pain Descriptors / Indicators: Burning Pain Intervention(s): Monitored during session;Repositioned (declined ice at the end  of the session)     Hand Dominance Right   Extremity/Trunk Assessment Upper Extremity Assessment Upper Extremity Assessment: Overall WFL for tasks assessed   Lower Extremity Assessment Lower Extremity Assessment: LLE deficits/detail LLE Deficits / Details: post op deficits in stregth and ROM   Cervical / Trunk Assessment Cervical / Trunk Assessment: Kyphotic   Communication Communication Communication: No difficulties   Cognition Arousal/Alertness: Awake/alert Behavior During Therapy: WFL for tasks assessed/performed Overall Cognitive Status: Within Functional Limits for tasks assessed                     General Comments       Exercises       Shoulder Instructions      Home Living Family/patient expects to be discharged to:: Private residence Living Arrangements: Spouse/significant other Available Help at Discharge: Family;Available 24 hours/day Type of Home: House Home Access: Stairs to enter [employer] of Steps: 2 Entrance Stairs-Rails: Can reach both Home Layout: One level     Bathroom Shower/Tub: Occupational psychologist: Standard Bathroom Accessibility: Yes How Accessible: Accessible via walker Home Equipment: Bedside commode;Walker - 2 wheels;Cane - single point;Grab bars - tub/shower          Prior Functioning/Environment Level of Independence: Independent                 OT Problem List: Decreased range of motion;Decreased activity tolerance;Decreased strength;Impaired balance (sitting and/or standing);Decreased knowledge of use of DME or AE;Pain      OT Treatment/Interventions:      OT Goals(Current goals can be found in the care plan section) Acute Rehab OT Goals Patient Stated Goal: Return home OT Goal Formulation: With patient Time For Goal Achievement: 09/22/16 Potential to Achieve Goals: Good  OT Frequency:     Barriers to D/C:            Co-evaluation              End of Session  Equipment Utilized During Treatment: Gait belt;Rolling walker Nurse Communication: Mobility status;Weight bearing status  Activity Tolerance: Patient tolerated treatment well Patient left: in chair;with call bell/phone within reach  OT Visit Diagnosis: Other abnormalities of gait and mobility (R26.89)                ADL either performed or assessed with clinical judgement  Time: 0936-1005 OT Time Calculation (min): 29 min Charges:  OT General Charges $OT Visit: 1 Procedure OT Evaluation $OT Eval Moderate Complexity: 1 Procedure OT Treatments $Self Care/Home Management : 8-22 mins G-Codes:     Hulda Humphrey OTR/L [phone]   Merri Ray Sophonie Goforth 09/15/2016, 11:13 AM

## 2016-09-16 NOTE — Progress Notes (Signed)
Patient ID: Manuel Hunter, male   DOB: [birth year], 76 y.o.   MRN: [id number] Doing well.  Vitals stable.  Can be discharged to home today.  Hip stable.

## 2016-09-16 NOTE — Plan of Care (Signed)
Problem: Safety: Goal: Ability to remain free from injury will improve Outcome: Progressing Safety precautions and fall preventions maintained  Problem: Pain Managment: Goal: General experience of comfort will improve Outcome: Progressing Medicated twice for pain with moderate relief  Problem: Tissue Perfusion: Goal: Risk factors for ineffective tissue perfusion will decrease Outcome: Progressing Denies S/S of DVT  Problem: Activity: Goal: Risk for activity intolerance will decrease Outcome: Progressing Tolerates activity well  Problem: Bowel/Gastric: Goal: Will not experience complications related to bowel motility Outcome: Progressing Last BM on 2/227/2018, no bowel issues reported

## 2016-09-16 NOTE — Discharge Summary (Signed)
Patient ID: Manuel Hunter MRN: [id number] DOB/AGE: 01-10-1941 76 y.o.  Admit date: 09/13/2016 Discharge date: 09/16/2016  Admission Diagnoses:  Principal Problem:   Unilateral primary osteoarthritis, left hip Active Problems:   Status post left hip replacement   Discharge Diagnoses:  Same  Past Medical History:  Diagnosis Date  . Arthritis   . Complication of anesthesia    States "I woke up to soon one time"  . GERD (gastroesophageal reflux disease)   . Headache   . History of hiatal hernia   . Knee pain, left   . Prostate cancer ([place])   . RLS (restless legs syndrome)   . Sleep apnea     Surgeries: Procedure(s): LEFT TOTAL HIP ARTHROPLASTY ANTERIOR APPROACH on 09/13/2016   Consultants:   Discharged Condition: Improved  Hospital Course: Manuel Hunter is an 76 y.o. male who was admitted 09/13/2016 for operative treatment ofUnilateral primary osteoarthritis, left hip. Patient has severe unremitting pain that affects sleep, daily activities, and work/hobbies. After pre-op clearance the patient was taken to the operating room on 09/13/2016 and underwent  Procedure(s): LEFT TOTAL HIP ARTHROPLASTY ANTERIOR APPROACH.    Patient was given perioperative antibiotics: Anti-infectives    Start     Dose/Rate Route Frequency Ordered Stop   09/13/16 2100  ceFAZolin (ANCEF) IVPB 1 g/50 mL premix     1 g 100 mL/hr over 30 Minutes Intravenous Every 6 hours 09/13/16 2007 09/14/16 0235   09/13/16 1345  ceFAZolin (ANCEF) IVPB 2g/100 mL premix     2 g 200 mL/hr over 30 Minutes Intravenous On call to O.R. 09/13/16 1159 09/13/16 1520   09/13/16 1200  ceFAZolin (ANCEF) 2-4 GM/100ML-% IVPB    Comments:  Leandrew Koyanagi   : cabinet override      09/13/16 1200 09/13/16 1520       Patient was given sequential compression devices, early ambulation, and chemoprophylaxis to prevent DVT.  Patient benefited maximally from hospital stay and there were no complications.    Recent vital signs:  Patient Vitals for the past 24 hrs:  BP Temp Temp src Pulse Resp SpO2  09/16/16 0452 133/73 98.3 F (36.8 C) Oral 73 17 96 %  09/15/16 1900 (!) 153/66 98.7 F (37.1 C) Oral 84 18 95 %  09/15/16 1549 (!) 118/55 98.8 F (37.1 C) Oral 86 19 98 %     Recent laboratory studies:  Recent Labs  09/14/16 0443  WBC 9.4  HGB 12.2*  HCT 35.9*  PLT 189  NA 136  K 4.2  CL 99*  CO2 29  BUN 17  CREATININE 1.13  GLUCOSE 161*  CALCIUM 8.5*     Discharge Medications:   Allergies as of 09/16/2016      Reactions   Morphine Nausea And Vomiting      Medication List    TAKE these medications   aspirin EC 81 MG tablet Take 1 tablet (81 mg total) by mouth 2 (two) times daily after a meal. What changed:  when to take this   methocarbamol 500 MG tablet Commonly known as:  ROBAXIN Take 1 tablet (500 mg total) by mouth every 6 (six) hours as needed for muscle spasms. What changed:  when to take this   naproxen sodium 220 MG tablet Commonly known as:  ANAPROX Take 440 mg by mouth daily as needed (pain).   ondansetron 4 MG disintegrating tablet Commonly known as:  ZOFRAN ODT Take 1 tablet (4 mg total) by mouth 4 (four) times daily as  needed for nausea or vomiting.   oxyCODONE-acetaminophen 5-325 MG tablet Commonly known as:  ROXICET Take 1-2 tablets by mouth every 4 (four) hours as needed.   pantoprazole 40 MG tablet Commonly known as:  PROTONIX Take 40 mg by mouth daily.   phenytoin 100 MG ER capsule Commonly known as:  DILANTIN Take 200 mg by mouth at bedtime.   rOPINIRole 1 MG tablet Commonly known as:  REQUIP Take 1 mg by mouth 3 (three) times daily with meals.   rOPINIRole 3 MG tablet Commonly known as:  REQUIP Take 3 mg by mouth at bedtime.   zolpidem 10 MG tablet Commonly known as:  AMBIEN Take 10 mg by mouth at bedtime as needed for sleep.            Durable Medical Equipment        Start     Ordered   09/13/16 2007  DME Walker rolling  Once     Question:  Patient needs a walker to treat with the following condition  Answer:  Status post left hip replacement   09/13/16 2007   09/13/16 2007  DME 3 n 1  Once     09/13/16 2007      Diagnostic Studies: Dg Pelvis Portable  Result Date: 09/13/2016 CLINICAL DATA:  Status post left hip replacement. EXAM: PORTABLE PELVIS 1-2 VIEWS COMPARISON:  None. FINDINGS: The left femoral and acetabular components appear to be well situated. Expected postoperative changes seen in the adjacent soft tissues. Postsurgical changes are noted in the pelvis. No fracture or dislocation is noted. IMPRESSION: Status post left total hip arthroplasty. Electronically Signed   By: Marijo Conception, M.D.   On: 09/13/2016 20:15   Dg C-arm 1-60 Min  Result Date: 09/13/2016 CLINICAL DATA:  Hip replacement. EXAM: OPERATIVE LEFT HIP (WITH PELVIS IF PERFORMED)  VIEWS TECHNIQUE: Fluoroscopic spot image(s) were submitted for interpretation post-operatively. COMPARISON:  02/05/ 2018 . FINDINGS: Total left hip replacement. Hardware intact. Anatomic alignment. No acute bony abnormality. Penile prosthesis noted. Surgical clips in the pelvis. IMPRESSION: Total left hip replacement with good anatomic alignment. Electronically Signed   By: Marcello Moores  Register   On: 09/13/2016 16:43   Dg Hip Operative Unilat W Or W/o Pelvis Left  Result Date: 09/13/2016 CLINICAL DATA:  Hip replacement. EXAM: OPERATIVE LEFT HIP (WITH PELVIS IF PERFORMED)  VIEWS TECHNIQUE: Fluoroscopic spot image(s) were submitted for interpretation post-operatively. COMPARISON:  02/05/ 2018 . FINDINGS: Total left hip replacement. Hardware intact. Anatomic alignment. No acute bony abnormality. Penile prosthesis noted. Surgical clips in the pelvis. IMPRESSION: Total left hip replacement with good anatomic alignment. Electronically Signed   By: Marcello Moores  Register   On: 09/13/2016 16:43   Dg Hip Unilat With Pelvis 2-3 Views Left  Result Date: 08/22/2016 CLINICAL DATA:  76 year old  male with left hip pain for 2 months. No injury. Prostate cancer. Initial encounter. EXAM: DG HIP (WITH OR WITHOUT PELVIS) 2-3V LEFT COMPARISON:  None. FINDINGS: Moderate-to-marked left hip joint degenerative changes. Mild to moderate right hip joint degenerative changes. No plain film evidence of avascular necrosis of the femoral head. Mild degenerative changes pubic symphysis. Degenerative changes lower lumbar spine. Mild sacroiliac joint degenerative changes. Mild bony overgrowth iliac wings. No obvious sclerotic metastatic foci. Post prostatectomy with penile implant. IMPRESSION: Left greater than right hip joint degenerative changes as detailed above. Electronically Signed   By: Genia Del M.D.   On: 08/22/2016 08:52    Disposition:  To home  Discharge Instructions  Discharge patient    Complete by:  As directed    Discharge disposition:  01-Home or Self Care   Discharge patient date:  09/16/2016      Follow-up Information    [facility] Follow up.   Specialty:  [place] Why:  Someone from [facility] will contact you to arrange start date and time for therapy. Contact information: [place] [address] [phone]        [name], MD Follow up in 2 week(s).   Specialty:  Orthopedic Surgery Contact information: [place] [address] [phone]            Signed: [name] 09/16/2016, 7:00 AM

## 2016-09-16 NOTE — Progress Notes (Signed)
Physical Therapy Treatment Patient Details Name: Manuel Hunter MRN: [id number] DOB: 1941/05/21 Today's Date: 09/16/2016    History of Present Illness Pt is 76 y.o. male admitted for L THA; WBAT with direct anterior approach.  PMH pertinent for arthritis, L knee pain, prostate cancer ([place]), restless leg syndrome, and sleep apnea. Past surgical hx includes RC repair, neck sx, knee sx, and prostate sx.    PT Comments    Patient is making good progress with PT. Pt with improved AROM and bed mobility this session. Little c/o pain. From a mobility standpoint anticipate patient will be ready for DC home when medically ready.      Follow Up Recommendations  Home health PT     Equipment Recommendations  None recommended by PT    Recommendations for Other Services       Precautions / Restrictions Precautions Precautions: None Restrictions Weight Bearing Restrictions: Yes LLE Weight Bearing: Weight bearing as tolerated    Mobility  Bed Mobility Overal bed mobility: Modified Independent Bed Mobility: Supine to Sit;Sit to Supine           General bed mobility comments: increased time/effort  Transfers Overall transfer level: Needs assistance Equipment used: Rolling walker (2 wheeled) Transfers: Sit to/from Stand Sit to Stand: Supervision         General transfer comment: cues for safety  Ambulation/Gait Ambulation/Gait assistance: Supervision Ambulation Distance (Feet): 20 Feet Assistive device: Rolling walker (2 wheeled) Gait Pattern/deviations: Step-through pattern;Trunk flexed;Decreased stride length     General Gait Details: cues for posture   Stairs            Wheelchair Mobility    Modified Rankin (Stroke Patients Only)       Balance     Sitting balance-Leahy Scale: Good       Standing balance-Leahy Scale: Fair                      Cognition Arousal/Alertness: Awake/alert Behavior During Therapy: WFL for tasks  assessed/performed Overall Cognitive Status: Within Functional Limits for tasks assessed                      Exercises Total Joint Exercises Quad Sets: AROM;Left;10 reps Short Arc Quad: AROM;Left;10 reps Heel Slides: AROM;Left;10 reps Hip ABduction/ADduction: AROM;Left;10 reps Long Arc Quad: AROM;Left;10 reps Knee Flexion: AROM;Left;10 reps;Standing Marching in Standing: AROM;Left;10 reps;Standing Standing Hip Extension: AROM;Left;10 reps;Standing    General Comments        Pertinent Vitals/Pain Pain Assessment: Faces Faces Pain Scale: Hurts little more Pain Location: L hip  Pain Descriptors / Indicators: Sore Pain Intervention(s): Monitored during session;Premedicated before session;Repositioned    Home Living                      Prior Function            PT Goals (current goals can now be found in the care plan section) Acute Rehab PT Goals Patient Stated Goal: Return home Progress towards PT goals: Progressing toward goals    Frequency    7X/week      PT Plan Current plan remains appropriate    Co-evaluation             End of Session Equipment Utilized During Treatment: Gait belt Activity Tolerance: Patient tolerated treatment well Patient left: with call bell/phone within reach;in bed Nurse Communication: Mobility status PT Visit Diagnosis: Muscle weakness (generalized) (M62.81);Unsteadiness on feet (R26.81)  Time: [id number] PT Time Calculation (min) (ACUTE ONLY): 30 min  Charges:  $Gait Training: 8-22 mins $Therapeutic Exercise: 8-22 mins                    G Codes:       Salina April, PTA Pager: [phone]   09/16/2016, 9:50 AM

## 2016-09-16 NOTE — Progress Notes (Signed)
Patient discharged to home with belongings, IVs removed. AVS given, prescriptions given. All questions answered, patient stable at time of discharge.

## 2016-09-19 ENCOUNTER — Telehealth (INDEPENDENT_AMBULATORY_CARE_PROVIDER_SITE_OTHER): Payer: Self-pay | Admitting: Orthopaedic Surgery

## 2016-09-19 NOTE — Telephone Encounter (Signed)
Verbal order given on VM 

## 2016-09-19 NOTE — Telephone Encounter (Signed)
Anderson [place] physical therapist with [facility]#: [phone]  Requesting verbal for following orders:   PT 1 x last week Now 3x a week for the next 2 weeks

## 2016-09-26 ENCOUNTER — Ambulatory Visit (INDEPENDENT_AMBULATORY_CARE_PROVIDER_SITE_OTHER): Payer: Medicare HMO | Admitting: Orthopaedic Surgery

## 2016-09-26 DIAGNOSIS — Z96642 Presence of left artificial hip joint: Secondary | ICD-10-CM

## 2016-09-26 NOTE — Progress Notes (Signed)
Manuel Hunter will be 2 weeks tomorrow status post a left total hip replacement. He is doing well. He is still ambulate with a walker. He said the stabbing pain that he had in his groin is gone but he still sore. He's been on an aspirin twice a day. He is ambulate with a walker still.  On examination of his hip he has fluid range of motion of his left hip. It appears well located. His leg lengths are near equal. His incision looks good. I removed the old Steri-Strips and placed knee Steri-Strips. He does have a moderate seroma and after cleaning the area with Betadine and alcohol prep pad I aspirated about 80 mL of fluid from his hip soft tissue. He tolerated this well.  He'll slowly continue increase his activities. I would like to extend his home health therapy for 1-2 more weeks. I did refill his hydrocodone. We'll see him back in a month to see how is doing overall but no x-rays are needed.

## 2016-10-13 ENCOUNTER — Other Ambulatory Visit (INDEPENDENT_AMBULATORY_CARE_PROVIDER_SITE_OTHER): Payer: Self-pay | Admitting: Orthopaedic Surgery

## 2016-10-13 NOTE — Telephone Encounter (Signed)
Please advise 

## 2016-10-31 ENCOUNTER — Ambulatory Visit (INDEPENDENT_AMBULATORY_CARE_PROVIDER_SITE_OTHER): Payer: Medicare HMO | Admitting: Orthopaedic Surgery

## 2016-11-07 ENCOUNTER — Ambulatory Visit (INDEPENDENT_AMBULATORY_CARE_PROVIDER_SITE_OTHER): Payer: Medicare HMO | Admitting: Orthopaedic Surgery

## 2016-11-07 DIAGNOSIS — Z96642 Presence of left artificial hip joint: Secondary | ICD-10-CM

## 2016-11-07 DIAGNOSIS — M25552 Pain in left hip: Secondary | ICD-10-CM | POA: Diagnosis not present

## 2016-11-07 MED ORDER — LIDOCAINE HCL 1 % IJ SOLN
3.0000 mL | INTRAMUSCULAR | Status: AC | PRN
  Administered 2016-11-07: 3 mL

## 2016-11-07 MED ORDER — METHYLPREDNISOLONE ACETATE 40 MG/ML IJ SUSP
40.0000 mg | INTRAMUSCULAR | Status: AC | PRN
  Administered 2016-11-07: 40 mg via INTRA_ARTICULAR

## 2016-11-07 NOTE — Progress Notes (Signed)
   Procedure Note  Patient: Manuel Hunter             Date of Birth: [birth year]           MRN: [id number]             Visit Date: 11/07/2016  Procedures: Visit Diagnoses: Status post left hip replacement  Large Joint Inj Date/Time: 11/07/2016 10:13 AM Performed by: [name] Authorized by: [name]   Location:  Knee Site:  R knee Ultrasound Guidance: No   Fluoroscopic Guidance: No   Arthrogram: No   Medications:  3 mL lidocaine 1 %; 40 mg methylPREDNISolone acetate 40 MG/ML

## 2016-11-07 NOTE — Progress Notes (Signed)
Mr. Graser is getting close 8 weeks status post a left total hip replacement. He said the hip is doing well. He is walking without an assistive device. His pain and discomfort is minimal. 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  On examination of his left hip his incisions healed nicely. There is minimal swelling. His right knee does have a varus deformity and significant pain with some slight swelling as well. His range of motion is limited by pain he lacks full extension by about 3 but he has good flexion.  He did ask for a steroid injection in his right knee and I agree with this. Up we talked about the risk moves injections and that I placed injection of 3 mL lidocaine mixed with 1 mL Depo-Medrol in his left knee that he tolerated well. At this point I really don't need see him back in terms of his hip for 6 months. We'll have a low AP pelvis at that visit. If he still having knee problems and would like to have an x-ray of his right knee we can get an AP and lateral of his right knee at that visit if warranted.

## 2017-03-08 ENCOUNTER — Encounter (HOSPITAL_BASED_OUTPATIENT_CLINIC_OR_DEPARTMENT_OTHER): Payer: Self-pay | Admitting: *Deleted

## 2017-03-09 ENCOUNTER — Ambulatory Visit: Payer: Self-pay | Admitting: Orthopedic Surgery

## 2017-03-10 ENCOUNTER — Ambulatory Visit (HOSPITAL_BASED_OUTPATIENT_CLINIC_OR_DEPARTMENT_OTHER): Payer: Medicare HMO | Admitting: Certified Registered"

## 2017-03-10 ENCOUNTER — Encounter (HOSPITAL_BASED_OUTPATIENT_CLINIC_OR_DEPARTMENT_OTHER): Payer: Self-pay | Admitting: Certified Registered"

## 2017-03-10 ENCOUNTER — Encounter (HOSPITAL_BASED_OUTPATIENT_CLINIC_OR_DEPARTMENT_OTHER)
Admission: RE | Disposition: A | Discharge status: Home / Self Care | Payer: Self-pay | Source: Ambulatory Visit | Attending: Orthopedic Surgery

## 2017-03-10 ENCOUNTER — Ambulatory Visit (HOSPITAL_BASED_OUTPATIENT_CLINIC_OR_DEPARTMENT_OTHER)
Admission: RE | Admit: 2017-03-10 | Discharge: 2017-03-10 | Disposition: A | Discharge status: Home / Self Care | Payer: Medicare HMO | Source: Ambulatory Visit | Attending: Orthopedic Surgery | Admitting: Orthopedic Surgery

## 2017-03-10 DIAGNOSIS — G4730 Sleep apnea, unspecified: Secondary | ICD-10-CM | POA: Insufficient documentation

## 2017-03-10 DIAGNOSIS — G2581 Restless legs syndrome: Secondary | ICD-10-CM | POA: Insufficient documentation

## 2017-03-10 DIAGNOSIS — K449 Diaphragmatic hernia without obstruction or gangrene: Secondary | ICD-10-CM | POA: Diagnosis not present

## 2017-03-10 DIAGNOSIS — Z79899 Other long term (current) drug therapy: Secondary | ICD-10-CM | POA: Insufficient documentation

## 2017-03-10 DIAGNOSIS — Z8546 Personal history of malignant neoplasm of prostate: Secondary | ICD-10-CM | POA: Diagnosis not present

## 2017-03-10 DIAGNOSIS — M19042 Primary osteoarthritis, left hand: Secondary | ICD-10-CM | POA: Diagnosis not present

## 2017-03-10 DIAGNOSIS — Z7982 Long term (current) use of aspirin: Secondary | ICD-10-CM | POA: Diagnosis not present

## 2017-03-10 DIAGNOSIS — R51 Headache: Secondary | ICD-10-CM | POA: Insufficient documentation

## 2017-03-10 DIAGNOSIS — Z885 Allergy status to narcotic agent status: Secondary | ICD-10-CM | POA: Insufficient documentation

## 2017-03-10 DIAGNOSIS — K219 Gastro-esophageal reflux disease without esophagitis: Secondary | ICD-10-CM | POA: Insufficient documentation

## 2017-03-10 DIAGNOSIS — Z96642 Presence of left artificial hip joint: Secondary | ICD-10-CM | POA: Insufficient documentation

## 2017-03-10 HISTORY — PX: FINGER ARTHROPLASTY: SHX5017

## 2017-03-10 SURGERY — ARTHROPLASTY, FINGER
Anesthesia: Regional | Site: Hand | Laterality: Left

## 2017-03-10 MED ORDER — LIDOCAINE HCL (CARDIAC) 20 MG/ML IV SOLN
INTRAVENOUS | Status: DC | PRN
  Administered 2017-03-10: 30 mg via INTRAVENOUS

## 2017-03-10 MED ORDER — PROPOFOL 10 MG/ML IV BOLUS
INTRAVENOUS | Status: AC
  Filled 2017-03-10: qty 20

## 2017-03-10 MED ORDER — OXYCODONE-ACETAMINOPHEN 5-325 MG PO TABS: 2.0000 | ORAL_TABLET | Freq: Four times a day (QID) | ORAL | 0 refills | Status: AC | PRN

## 2017-03-10 MED ORDER — PROPOFOL 500 MG/50ML IV EMUL
INTRAVENOUS | Status: AC
  Filled 2017-03-10: qty 100

## 2017-03-10 MED ORDER — ONDANSETRON HCL 4 MG/2ML IJ SOLN
INTRAMUSCULAR | Status: DC | PRN
  Administered 2017-03-10: 4 mg via INTRAVENOUS

## 2017-03-10 MED ORDER — ONDANSETRON HCL 4 MG/2ML IJ SOLN
INTRAMUSCULAR | Status: AC
  Filled 2017-03-10: qty 8

## 2017-03-10 MED ORDER — MEPERIDINE HCL 25 MG/ML IJ SOLN: 6.2500 mg | INTRAMUSCULAR | Status: DC | PRN

## 2017-03-10 MED ORDER — CHLORHEXIDINE GLUCONATE 4 % EX LIQD: 60.0000 mL | Freq: Once | CUTANEOUS | Status: DC

## 2017-03-10 MED ORDER — MIDAZOLAM HCL 2 MG/2ML IJ SOLN
INTRAMUSCULAR | Status: AC
  Filled 2017-03-10: qty 2

## 2017-03-10 MED ORDER — FENTANYL CITRATE (PF) 100 MCG/2ML IJ SOLN
50.0000 ug | INTRAMUSCULAR | Status: DC | PRN
  Administered 2017-03-10: 50 ug via INTRAVENOUS

## 2017-03-10 MED ORDER — CEFAZOLIN SODIUM-DEXTROSE 2-4 GM/100ML-% IV SOLN
2.0000 g | INTRAVENOUS | Status: AC
  Administered 2017-03-10: 2 g via INTRAVENOUS

## 2017-03-10 MED ORDER — SCOPOLAMINE 1 MG/3DAYS TD PT72: 1.0000 | MEDICATED_PATCH | Freq: Once | TRANSDERMAL | Status: DC | PRN

## 2017-03-10 MED ORDER — LIDOCAINE 2% (20 MG/ML) 5 ML SYRINGE
INTRAMUSCULAR | Status: AC
  Filled 2017-03-10: qty 10

## 2017-03-10 MED ORDER — MIDAZOLAM HCL 2 MG/2ML IJ SOLN
1.0000 mg | INTRAMUSCULAR | Status: DC | PRN
  Administered 2017-03-10: 2 mg via INTRAVENOUS

## 2017-03-10 MED ORDER — CEFAZOLIN SODIUM-DEXTROSE 2-4 GM/100ML-% IV SOLN
INTRAVENOUS | Status: AC
  Filled 2017-03-10: qty 100

## 2017-03-10 MED ORDER — LACTATED RINGERS IV SOLN
INTRAVENOUS | Status: DC
  Administered 2017-03-10: 07:00:00 via INTRAVENOUS

## 2017-03-10 MED ORDER — ONDANSETRON HCL 4 MG/2ML IJ SOLN: 4.0000 mg | Freq: Once | INTRAMUSCULAR | Status: DC | PRN

## 2017-03-10 MED ORDER — PROPOFOL 500 MG/50ML IV EMUL
INTRAVENOUS | Status: DC | PRN
  Administered 2017-03-10: 75 ug/kg/min via INTRAVENOUS

## 2017-03-10 MED ORDER — DEXAMETHASONE SODIUM PHOSPHATE 10 MG/ML IJ SOLN
INTRAMUSCULAR | Status: AC
  Filled 2017-03-10: qty 2

## 2017-03-10 MED ORDER — FENTANYL CITRATE (PF) 100 MCG/2ML IJ SOLN
INTRAMUSCULAR | Status: AC
  Filled 2017-03-10: qty 2

## 2017-03-10 MED ORDER — BUPIVACAINE HCL (PF) 0.5 % IJ SOLN
INTRAMUSCULAR | Status: AC
  Filled 2017-03-10: qty 30

## 2017-03-10 MED ORDER — CEPHALEXIN 500 MG PO CAPS: 500.0000 mg | ORAL_CAPSULE | Freq: Four times a day (QID) | ORAL | 0 refills | Status: AC

## 2017-03-10 MED ORDER — HYDROMORPHONE HCL 1 MG/ML IJ SOLN: 0.2500 mg | INTRAMUSCULAR | Status: DC | PRN

## 2017-03-10 SURGICAL SUPPLY — 66 items
BANDAGE ACE 3X5.8 VEL STRL LF (GAUZE/BANDAGES/DRESSINGS) ×6 IMPLANT
BLADE CLIPPER SURG (BLADE) ×3 IMPLANT
BLADE SURG 15 STRL LF DISP TIS (BLADE) ×3 IMPLANT
BLADE SURG 15 STRL SS (BLADE) ×6
BNDG CONFORM 3 STRL LF (GAUZE/BANDAGES/DRESSINGS) ×3 IMPLANT
BNDG GAUZE ELAST 4 BULKY (GAUZE/BANDAGES/DRESSINGS) ×3 IMPLANT
BRUSH SCRUB EZ PLAIN DRY (MISCELLANEOUS) ×3 IMPLANT
BUR ROUND CARBIDE (BURR) IMPLANT
CANISTER SUCT 1200ML W/VALVE (MISCELLANEOUS) ×3 IMPLANT
CORD BIPOLAR FORCEPS 12FT (ELECTRODE) ×3 IMPLANT
COVER BACK TABLE 60X90IN (DRAPES) ×3 IMPLANT
CUFF TOURNIQUET SINGLE 18IN (TOURNIQUET CUFF) ×2 IMPLANT
DECANTER SPIKE VIAL GLASS SM (MISCELLANEOUS) IMPLANT
DRAPE EXTREMITY T 121X128X90 (DRAPE) ×3 IMPLANT
DRAPE OEC MINIVIEW 54X84 (DRAPES) IMPLANT
DRAPE SURG 17X23 STRL (DRAPES) ×3 IMPLANT
DRSG EMULSION OIL 3X3 NADH (GAUZE/BANDAGES/DRESSINGS) ×3 IMPLANT
GAUZE SPONGE 4X4 16PLY XRAY LF (GAUZE/BANDAGES/DRESSINGS) IMPLANT
GLOVE BIO SURGEON STRL SZ 6.5 (GLOVE) ×1 IMPLANT
GLOVE BIO SURGEONS STRL SZ 6.5 (GLOVE) ×1
GLOVE BIOGEL M STRL SZ7.5 (GLOVE) IMPLANT
GLOVE BIOGEL PI IND STRL 7.0 (GLOVE) IMPLANT
GLOVE BIOGEL PI INDICATOR 7.0 (GLOVE) ×4
GLOVE SS BIOGEL STRL SZ 8 (GLOVE) ×1 IMPLANT
GLOVE SUPERSENSE BIOGEL SZ 8 (GLOVE) ×2
GOWN STRL REUS W/ TWL LRG LVL3 (GOWN DISPOSABLE) ×1 IMPLANT
GOWN STRL REUS W/ TWL XL LVL3 (GOWN DISPOSABLE) ×1 IMPLANT
GOWN STRL REUS W/TWL LRG LVL3 (GOWN DISPOSABLE) ×3
GOWN STRL REUS W/TWL XL LVL3 (GOWN DISPOSABLE) ×3
IMPL MCP DISTAL 30 (Orthopedic Implant) IMPLANT
IMPL PROXIMAL MCP 30 (Orthopedic Implant) IMPLANT
IMPLANT MCP DISTAL 30 (Orthopedic Implant) ×3 IMPLANT
IMPLANT PROXIMAL MCP 30 (Orthopedic Implant) ×3 IMPLANT
NDL HYPO 25X1 1.5 SAFETY (NEEDLE) ×1 IMPLANT
NEEDLE HYPO 22GX1.5 SAFETY (NEEDLE) IMPLANT
NEEDLE HYPO 25X1 1.5 SAFETY (NEEDLE) ×3 IMPLANT
NS IRRIG 1000ML POUR BTL (IV SOLUTION) ×3 IMPLANT
PACK BASIN DAY SURGERY FS (CUSTOM PROCEDURE TRAY) ×3 IMPLANT
PACK MCP STRYKER DISPOSABLE (KITS) ×2 IMPLANT
PAD CAST 3X4 CTTN HI CHSV (CAST SUPPLIES) ×2 IMPLANT
PADDING CAST ABS 3INX4YD NS (CAST SUPPLIES) ×2
PADDING CAST ABS 4INX4YD NS (CAST SUPPLIES) ×2
PADDING CAST ABS COTTON 3X4 (CAST SUPPLIES) ×1 IMPLANT
PADDING CAST ABS COTTON 4X4 ST (CAST SUPPLIES) ×1 IMPLANT
PADDING CAST COTTON 3X4 STRL (CAST SUPPLIES) ×6
SHEET MEDIUM DRAPE 40X70 STRL (DRAPES) ×3 IMPLANT
SPLINT FIBERGLASS 4X30 (CAST SUPPLIES) ×2 IMPLANT
SPLINT PLASTER CAST XFAST 3X15 (CAST SUPPLIES) IMPLANT
SPLINT PLASTER XTRA FASTSET 3X (CAST SUPPLIES)
STOCKINETTE 4X48 STRL (DRAPES) ×3 IMPLANT
STOCKINETTE SYNTHETIC 3 UNSTER (CAST SUPPLIES) ×3 IMPLANT
SUCTION FRAZIER HANDLE 10FR (MISCELLANEOUS) ×2
SUCTION TUBE FRAZIER 10FR DISP (MISCELLANEOUS) ×1 IMPLANT
SUT FIBERWIRE 3-0 18 TAPR NDL (SUTURE)
SUT FIBERWIRE 4-0 18 TAPR NDL (SUTURE) ×6
SUT PROLENE 4 0 PS 2 18 (SUTURE) ×4 IMPLANT
SUTURE FIBERWR 3-0 18 TAPR NDL (SUTURE) IMPLANT
SUTURE FIBERWR 4-0 18 TAPR NDL (SUTURE) ×2 IMPLANT
SYR BULB 3OZ (MISCELLANEOUS) ×3 IMPLANT
SYR CONTROL 10ML LL (SYRINGE) ×6 IMPLANT
TAPE SURG TRANSPORE 1 IN (GAUZE/BANDAGES/DRESSINGS) ×1 IMPLANT
TAPE SURGICAL TRANSPORE 1 IN (GAUZE/BANDAGES/DRESSINGS) ×2
TOWEL OR 17X24 6PK STRL BLUE (TOWEL DISPOSABLE) ×6 IMPLANT
TUBE CONNECTING 20'X1/4 (TUBING) ×1
TUBE CONNECTING 20X1/4 (TUBING) ×2 IMPLANT
UNDERPAD 30X30 (UNDERPADS AND DIAPERS) ×3 IMPLANT

## 2017-03-10 NOTE — Op Note (Signed)
NAME:  Manuel Hunter, Manuel Hunter NO.:  MEDICAL RECORD NO.:  [id number]  LOCATION:                                 FACILITY:  PHYSICIAN:  Hrithik Boschee. 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  PROCEDURE: 1. Left index finger metacarpophalangeal arthroplasty with an     [employer] power carbon size 30 implant. 2. AP, lateral, and oblique x-rays performed, examined, interpreted by     myself.  SURGEON:  Satira Anis. Amedeo Plenty, M.D.  ASSISTANT:  None.  COMPLICATIONS:  None.  ANESTHESIA:  Block with IV sedation.  TOURNIQUET TIME:  Less than 2 hours.  INDICATIONS:  A 76 year old male with transverse failure of formation and advanced arthrosis.  He understands risks and benefits of surgery, and desires to proceed the above-mentioned operative intervention.  OPERATION IN DETAIL:  The patient was seen by myself and Anesthesia. Taken to the operative theater, underwent smooth induction of IV sedation.  Preoperative block was placed without difficulty. Preoperative antibiotics were given.  He was prepped and draped with 2 Hibiclens scrubs by myself, followed by a 10-minute surgical Betadine scrub and paint by our staff.  Once this was complete, outline marks were made, tourniquet insufflated, time-out observed.  An incision was made.  Dissection was carried down.  Extensor apparatus was split midline.  Capsule was split midline.  Joint was shotgunned open and I then prepared the bony ends with synovectomy, followed by careful protection of the collateral ligament  architecture followed by step cuts according to standard protocol utilizing awl and guide pins.  The patient had significant deformity and thus I had to do some creative shaping to make sure that my cuts were perfect and in block fashion.  I then set about utilizing the distal followed by proximal broaches.  I was able to sized to a size 30, trialed this, it looked good and then implanted the size 30 power carbon [employer] implants.  I irrigated copiously at all points during the case and certainly aggressively before implant placement.  The patient had the capsule closed nicely. He had excellent stability.  Collateral ligament architecture was preserved without complicating feature.  Once this was complete, we then performed very careful and cautious closure of the extensor apparatus with FiberWire.  The patient tolerated this well.  Irrigation was applied.  The wound was closed with Prolene.  He was placed in a splint. He will be discharged home on antibiotics and pain medicine.  We will see him back in the office in 12 days and have therapy immediately following.  These notes have been discussed, and all questions have been encouraged and answered.  Pleasure to see him today and participate in his care plan.     Satira Anis. Amedeo Plenty, M.D.     [facility]  D:  03/10/2017  T:  03/10/2017  Job:  [id number]

## 2017-03-10 NOTE — Anesthesia Procedure Notes (Signed)
Anesthesia Regional Block: Supraclavicular block   Pre-Anesthetic Checklist: ,, timeout performed, Correct Patient, Correct Site, Correct Laterality, Correct Procedure, Correct Position, site marked, Risks and benefits discussed,  Surgical consent,  Pre-op evaluation,  At surgeon's request and post-op pain management  Laterality: Left  Prep: chloraprep       Needles:   Needle Type: Echogenic Stimulator Needle     Needle Length: 9cm  Needle Gauge: 21     Additional Needles:   Procedures: ultrasound guided, nerve stimulator,,,,,,   Nerve Stimulator or Paresthesia:  Response: 0.4 mA,   Additional Responses:   Narrative:  Start time: 03/10/2017 7:20 AM End time: 03/10/2017 7:30 AM Injection made incrementally with aspirations every 5 mL.  Performed by: Personally  Anesthesiologist: Lillia Abed  Additional Notes: Monitors applied. Patient sedated. Sterile prep and drape,hand hygiene and sterile gloves were used. Relevant anatomy identified.Needle position confirmed.Local anesthetic injected incrementally after negative aspiration. Local anesthetic spread visualized around nerve(s). Vascular puncture avoided. No complications. Image printed for medical record.The patient tolerated the procedure well.

## 2017-03-10 NOTE — Op Note (Signed)
See dictation#[id number] Amedeo Plenty MD

## 2017-03-10 NOTE — Anesthesia Preprocedure Evaluation (Addendum)
Anesthesia Evaluation  Patient identified by MRN, date of birth, ID band Patient awake    Reviewed: Allergy & Precautions, NPO status , Patient's Chart, lab work & pertinent test results  Airway Mallampati: II  TM Distance: >3 FB Neck ROM: Limited    Dental   Pulmonary sleep apnea ,    Pulmonary exam normal        Cardiovascular Normal cardiovascular exam     Neuro/Psych  Headaches,    GI/Hepatic hiatal hernia, GERD  Medicated and Controlled,  Endo/Other    Renal/GU      Musculoskeletal  (+) Arthritis ,   Abdominal   Peds  Hematology   Anesthesia Other Findings   Reproductive/Obstetrics                            Anesthesia Physical Anesthesia Plan  ASA: III  Anesthesia Plan: Regional and MAC   Post-op Pain Management:    Induction: Intravenous  PONV Risk Score and Plan: 1 and Ondansetron and Dexamethasone  Airway Management Planned: Simple Face Mask  Additional Equipment:   Intra-op Plan:   Post-operative Plan:   Informed Consent: I have reviewed the patients History and Physical, chart, labs and discussed the procedure including the risks, benefits and alternatives for the proposed anesthesia with the patient or authorized representative who has indicated his/her understanding and acceptance.     Plan Discussed with: CRNA and Surgeon  Anesthesia Plan Comments:         Anesthesia Quick Evaluation

## 2017-03-10 NOTE — Anesthesia Postprocedure Evaluation (Signed)
Anesthesia Post Note  Patient: Manuel Hunter  Procedure(s) Performed: Procedure(s) (LRB): Left index finger metacarpal phalangeal arthroplasty (Left)     Patient location during evaluation: PACU Anesthesia Type: Regional Level of consciousness: awake and alert and patient cooperative Pain management: pain level controlled Vital Signs Assessment: post-procedure vital signs reviewed and stable Respiratory status: spontaneous breathing and respiratory function stable Cardiovascular status: stable Anesthetic complications: no    Last Vitals:  Vitals:   03/10/17 0953 03/10/17 1005  BP: 123/73 130/71  Pulse:  64  Resp:  18  Temp:  36.4 C  SpO2:  97%    Last Pain:  Vitals:   03/10/17 1005  TempSrc: Oral  PainSc: 0-No pain                 Mandy Fitzwater DAVID

## 2017-03-10 NOTE — Transfer of Care (Signed)
Immediate Anesthesia Transfer of Care Note  Patient: Manuel Hunter  Procedure(s) Performed: Procedure(s) with comments: Left index finger metacarpal phalangeal arthroplasty (Left) - 90 mins  Patient Location: PACU  Anesthesia Type:MAC combined with regional for post-op pain  Level of Consciousness: awake, alert , oriented and patient cooperative  Airway & Oxygen Therapy: Patient Spontanous Breathing and Patient connected to face mask oxygen  Post-op Assessment: Report given to RN and Post -op Vital signs reviewed and stable  Post vital signs: Reviewed and stable  Last Vitals:  Vitals:   03/10/17 0647 03/10/17 0730  BP: (!) 144/65 135/78  Pulse: 61 62  Resp:  15  Temp: 36.4 C   SpO2: 99% 100%    Last Pain:  Vitals:   03/10/17 0647  TempSrc: Oral         Complications: No apparent anesthesia complications

## 2017-03-10 NOTE — Progress Notes (Signed)
Assisted Dr. Ossey with left, ultrasound guided, supraclavicular block. Side rails up, monitors on throughout procedure. See vital signs in flow sheet. Tolerated Procedure well. 

## 2017-03-10 NOTE — Discharge Instructions (Signed)
Regional Anesthesia Blocks  1. Numbness or the inability to move the "blocked" extremity may last from 3-48 hours after placement. The length of time depends on the medication injected and your individual response to the medication. If the numbness is not going away after 48 hours, call your surgeon.  2. The extremity that is blocked will need to be protected until the numbness is gone and the  Strength has returned. Because you cannot feel it, you will need to take extra care to avoid injury. Because it may be weak, you may have difficulty moving it or using it. You may not know what position it is in without looking at it while the block is in effect.  3. For blocks in the legs and feet, returning to weight bearing and walking needs to be done carefully. You will need to wait until the numbness is entirely gone and the strength has returned. You should be able to move your leg and foot normally before you try and bear weight or walk. You will need someone to be with you when you first try to ensure you do not fall and possibly risk injury.  4. Bruising and tenderness at the needle site are common side effects and will resolve in a few days.  5. Persistent numbness or new problems with movement should be communicated to the surgeon or the [place] [phone] [place] [phone]).   Post Anesthesia Home Care Instructions  Activity: Get plenty of rest for the remainder of the day. A responsible individual must stay with you for 24 hours following the procedure.  For the next 24 hours, DO NOT: -Drive a car -Paediatric nurse -Drink alcoholic beverages -Take any medication unless instructed by your physician -Make any legal decisions or sign important papers.  Meals: Start with liquid foods such as gelatin or soup. Progress to regular foods as tolerated. Avoid greasy, spicy, heavy foods. If nausea and/or vomiting occur, drink only clear liquids until the  nausea and/or vomiting subsides. Call your physician if vomiting continues.  Special Instructions/Symptoms: Your throat may feel dry or sore from the anesthesia or the breathing tube placed in your throat during surgery. If this causes discomfort, gargle with warm salt water. The discomfort should disappear within 24 hours.  If you had a scopolamine patch placed behind your ear for the management of post- operative nausea and/or vomiting:  1. The medication in the patch is effective for 72 hours, after which it should be removed.  Wrap patch in a tissue and discard in the trash. Wash hands thoroughly with soap and water. 2. You may remove the patch earlier than 72 hours if you experience unpleasant side effects which may include dry mouth, dizziness or visual disturbances. 3. Avoid touching the patch. Wash your hands with soap and water after contact with the patch.   We recommend that you to take vitamin C 1000 mg a day to promote healing. We also recommend that if you require  pain medicine that you take a stool softener to prevent constipation as most pain medicines will have constipation side effects. We recommend either Peri-Colace or Senokot and recommend that you also consider adding MiraLAX as well to prevent the constipation affects from pain medicine if you are required to use them. These medicines are over the counter and may be purchased at a local pharmacy. A cup of yogurt and a probiotic can also be helpful during the recovery process as the medicines can disrupt  your intestinal environment. Keep bandage clean and dry.  Call for any problems.  No smoking.  Criteria for driving a car: you should be off your pain medicine for 7-8 hours, able to drive one handed(confident), thinking clearly and feeling able in your judgement to drive. Continue elevation as it will decrease swelling.  If instructed by MD move your fingers within the confines of the bandage/splint.  Use ice if instructed by  your MD. Call immediately for any sudden loss of feeling in your hand/arm or change in functional abilities of the extremity.

## 2017-03-10 NOTE — H&P (Signed)
Manuel Hunter is an 76 y.o. male.   Chief Complaint: left index finger MCP Pain HPI: Patient presents for evaluation and treatment of the of their upper extremity predicament. The patient denies neck, back, chest or  abdominal pain. The patient notes that they have no lower extremity problems. The patients primary complaint is noted. We are planning surgical care pathway for the upper extremity.  Past Medical History:  Diagnosis Date  . Arthritis   . Complication of anesthesia    States "I woke up to soon one time"  . GERD (gastroesophageal reflux disease)   . Headache   . History of hiatal hernia   . Knee pain, left   . Prostate cancer ([place])   . RLS (restless legs syndrome)   . Sleep apnea    uses CPAP nightly    Past Surgical History:  Procedure Laterality Date  . KNEE SURGERY    . MANDIBLE FRACTURE SURGERY    . NECK SURGERY    . PROSTATE SURGERY    . ROTATOR CUFF REPAIR    . TOTAL HIP ARTHROPLASTY Left 09/13/2016   Procedure: LEFT TOTAL HIP ARTHROPLASTY ANTERIOR APPROACH;  Surgeon: [name], MD;  Location: [facility];  Service: Orthopedics;  Laterality: Left;    History reviewed. No pertinent family history. Social History:  reports that he has never smoked. He has never used smokeless tobacco. He reports that he does not drink alcohol or use drugs.  Allergies:  Allergies  Allergen Reactions  . Morphine Nausea And Vomiting    Medications Prior to Admission  Medication Sig Dispense Refill  . methocarbamol (ROBAXIN) 500 MG tablet Take 1 tablet (500 mg total) by mouth every 6 (six) hours as needed for muscle spasms. 60 tablet 0  . pantoprazole (PROTONIX) 40 MG tablet Take 40 mg by mouth daily.    . phenytoin (DILANTIN) 100 MG ER capsule Take 200 mg by mouth at bedtime.     Marland Kitchen rOPINIRole (REQUIP) 3 MG tablet Take 3 mg by mouth at bedtime.    Marland Kitchen aspirin 81 MG EC tablet TAKE 1 TABLET BY MOUTH TWICE A DAY AFTER A MEAL 60 tablet 0  . ondansetron (ZOFRAN ODT) 4 MG  disintegrating tablet Take 1 tablet (4 mg total) by mouth 4 (four) times daily as needed for nausea or vomiting. 30 tablet 0  . rOPINIRole (REQUIP) 1 MG tablet Take 1 mg by mouth 3 (three) times daily with meals.     Marland Kitchen zolpidem (AMBIEN) 10 MG tablet Take 10 mg by mouth at bedtime as needed for sleep.      No results found for this or any previous visit (from the past 48 hour(s)). No results found.  Review of Systems  Respiratory: Negative.   Cardiovascular: Negative.   Gastrointestinal: Negative.   Genitourinary: Negative.     Blood pressure (!) 144/65, pulse 61, temperature 97.6 F (36.4 C), temperature source Oral, height 5\' 6"  (1.676 m), weight 105.2 kg (232 lb), SpO2 99 %. Physical Exam h/o deformity and left index MCP DJD The patient is alert and oriented in no acute distress. The patient complains of pain in the affected upper extremity.  The patient is noted to have a normal HEENT exam. Lung fields show equal chest expansion and no shortness of breath. Abdomen exam is nontender without distention. Lower extremity examination does not show any fracture dislocation or blood clot symptoms. Pelvis is stable and the neck and back are stable and nontender. Assessment/Plan We are planning  surgery for your upper extremity. The risk and benefits of surgery to include risk of bleeding, infection, anesthesia,  damage to normal structures and failure of the surgery to accomplish its intended goals of relieving symptoms and restoring function have been discussed in detail. With this in mind we plan to proceed. I have specifically discussed with the patient the pre-and postoperative regime and the dos and don'ts and risk and benefits in great detail. Risk and benefits of surgery also include risk of dystrophy(CRPS), chronic nerve pain, failure of the healing process to go onto completion and other inherent risks of surgery The relavent the pathophysiology of the disease/injury process, as well as  the alternatives for treatment and postoperative course of action has been discussed in great detail with the patient who desires to proceed.  We will do everything in our power to help you (the patient) restore function to the upper extremity. It is a pleasure to see this patient today.  Our plan will be MCP Aplasty with repair as necessary  Paulene Floor, MD 03/10/2017, 7:25 AM

## 2017-03-10 NOTE — Anesthesia Procedure Notes (Signed)
Procedure Name: MAC Date/Time: 03/10/2017 7:55 AM Performed by: Levone Otten D Pre-anesthesia Checklist: Patient identified, Emergency Drugs available, Suction available, Patient being monitored and Timeout performed Patient Re-evaluated:Patient Re-evaluated prior to induction Oxygen Delivery Method: Simple face mask

## 2017-03-13 ENCOUNTER — Encounter (HOSPITAL_BASED_OUTPATIENT_CLINIC_OR_DEPARTMENT_OTHER): Payer: Self-pay | Admitting: Orthopedic Surgery

## 2017-05-08 ENCOUNTER — Ambulatory Visit (INDEPENDENT_AMBULATORY_CARE_PROVIDER_SITE_OTHER): Payer: Medicare HMO

## 2017-05-08 ENCOUNTER — Ambulatory Visit (INDEPENDENT_AMBULATORY_CARE_PROVIDER_SITE_OTHER): Payer: Medicare HMO | Admitting: Orthopaedic Surgery

## 2017-05-08 DIAGNOSIS — Z96642 Presence of left artificial hip joint: Secondary | ICD-10-CM

## 2017-05-08 NOTE — Progress Notes (Signed)
Manuel Hunter 8 months status post a left total hip arthroplasty. He says he does have pain getting out of the car but otherwise is doing well. Due to severe osteoarthritis and rheumatoid disease he hurts all over he states.  On exam is walking without assistive device and no significant limp. He tolerates me isn't putting his left hip to the extremes of motion without difficulty at all. A low AP pelvis shows a well-seated implant with no complicating features.  At this point a continued increase his activities. We went over his x-rays in detail. At this point I'll see him back in 6 months. I would like an AP and lateral of the left hip only at that visit.

## 2017-11-06 ENCOUNTER — Ambulatory Visit (INDEPENDENT_AMBULATORY_CARE_PROVIDER_SITE_OTHER): Payer: Medicare HMO | Admitting: Orthopaedic Surgery

## 2018-02-12 ENCOUNTER — Ambulatory Visit (INDEPENDENT_AMBULATORY_CARE_PROVIDER_SITE_OTHER): Payer: Medicare HMO

## 2018-02-12 ENCOUNTER — Encounter (INDEPENDENT_AMBULATORY_CARE_PROVIDER_SITE_OTHER): Payer: Self-pay | Admitting: Orthopaedic Surgery

## 2018-02-12 ENCOUNTER — Ambulatory Visit (INDEPENDENT_AMBULATORY_CARE_PROVIDER_SITE_OTHER): Payer: Medicare HMO | Admitting: Orthopaedic Surgery

## 2018-02-12 DIAGNOSIS — M25561 Pain in right knee: Secondary | ICD-10-CM

## 2018-02-12 DIAGNOSIS — G8929 Other chronic pain: Secondary | ICD-10-CM

## 2018-02-12 DIAGNOSIS — M1711 Unilateral primary osteoarthritis, right knee: Secondary | ICD-10-CM | POA: Diagnosis not present

## 2018-02-12 NOTE — Progress Notes (Signed)
Office Visit Note   Patient: Manuel Hunter           Date of Birth: [birth year]           MRN: [id number] Visit Date: 02/12/2018              Requested by: Reita Cliche, MD No address on file PCP: Reita Cliche, MD   Assessment & Plan: Visit Diagnoses:  1. Chronic pain of right knee   2. Unilateral primary osteoarthritis, right knee     Plan: Having had a left total knee arthroplasty as well as a left hip replacement he understands fully the risk and benefits of this type of surgery.  We talked about his intraoperative and postoperative course.  We did describe in detail what the surgery involves as well as the risk and benefits and his postoperative course.  All question concerns were answered and addressed.  We will work on getting this set up for later this year.  All question concerns were answered and addressed.  Follow-Up Instructions: Return for 2 weeks post-op.   Orders:  Orders Placed This Encounter  Procedures  . XR Knee 1-2 Views Right   No orders of the defined types were placed in this encounter.     Procedures: No procedures performed   Clinical Data: No additional findings.   Subjective: Chief Complaint  Patient presents with  . Right Knee - Pain  Patient is well-known to me.  Is been dealing with severe right knee pain and arthritis for years now.  Has had a successful left total hip arthroplasty and a left total knee arthroplasty.  At this point his right knee pain is daily.  He is having trouble going up and down stairs.  This is detrimentally affect is active daily living, his quality of life, his mobility.  He does wish to proceed with a right total knee arthroplasty at this point.  He is tried and failed all forms conservative treatment including activity modification, trying an assistive device to walk with.  He is tried physical therapy.  He has had multiple injections as well.  HPI  Review of Systems He currently denies any  headache, chest pain, shortness of breath, fever, chills, nausea, vomiting.  Objective: Vital Signs: There were no vitals taken for this visit.  Physical Exam He is alert and oriented x3 and in no acute distress Ortho Exam Examination of his right knee shows significant varus malalignment.  He has good range of motion of the knee overall with significant patellofemoral crepitation.  He has medial lateral joint line tenderness and a mild effusion. Specialty Comments:  No specialty comments available.  Imaging: Xr Knee 1-2 Views Right  Result Date: 02/12/2018 2 views of the right knee show severe tricompartmental arthritic changes involving all 3 compartments.  There is complete loss of medial joint space.  There is significant varus malalignment.  There are periarticular osteophytes throughout the knee.    PMFS History: Patient Active Problem List   Diagnosis Date Noted  . 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 Status post left hip replacement 09/13/2016   Past Medical History:  Diagnosis Date  . Arthritis   . Complication of anesthesia    States "I woke up to soon one time"  . GERD (gastroesophageal reflux disease)   . Headache   . History of hiatal hernia   . Knee pain, left   . Prostate cancer ([place])   . RLS (restless legs syndrome)   .  Sleep apnea    uses CPAP nightly    History reviewed. No pertinent family history.  Past Surgical History:  Procedure Laterality Date  . FINGER ARTHROPLASTY Left 03/10/2017   Procedure: Left index finger metacarpal phalangeal arthroplasty;  Surgeon: Roseanne Kaufman, MD;  Location: [facility];  Service: Orthopedics;  Laterality: Left;  90 mins  . KNEE SURGERY    . MANDIBLE FRACTURE SURGERY    . NECK SURGERY    . PROSTATE SURGERY    . ROTATOR CUFF REPAIR    . TOTAL HIP ARTHROPLASTY Left 09/13/2016   Procedure: LEFT TOTAL HIP ARTHROPLASTY ANTERIOR APPROACH;  Surgeon: [name], MD;  Location:  [facility];  Service: Orthopedics;  Laterality: Left;   Social History   Occupational History  . Not on file  Tobacco Use  . Smoking status: Never Smoker  . Smokeless tobacco: Never Used  Substance and Sexual Activity  . Alcohol use: No  . Drug use: No  . Sexual activity: Not on file

## 2018-02-22 ENCOUNTER — Other Ambulatory Visit (INDEPENDENT_AMBULATORY_CARE_PROVIDER_SITE_OTHER): Payer: Self-pay

## 2018-03-12 ENCOUNTER — Ambulatory Visit (INDEPENDENT_AMBULATORY_CARE_PROVIDER_SITE_OTHER): Payer: Medicare HMO | Admitting: Orthopaedic Surgery

## 2018-03-12 ENCOUNTER — Other Ambulatory Visit (HOSPITAL_COMMUNITY): Payer: Self-pay | Admitting: *Deleted

## 2018-03-12 NOTE — Patient Instructions (Addendum)
TADARRIUS BURCH  03/12/2018   Your procedure is scheduled on: 03-23-18  Report to [facility] Main  Entrance  Report to admitting at 645 AM    Call this number if you have problems the morning of surgery [phone]   Remember: Do not eat food or drink liquids :After Midnight.  BRING CPAP MASK AND TUBING   Take these medicines the morning of surgery with A SIP OF WATER: phenytoin (dilantin), pantaprazole (protonix), requip                               You may not have any metal on your body including hair pins and              piercings  Do not wear jewelry, make-up, lotions, powders or perfumes, deodorant             Do not wear nail polish.  Do not shave  48 hours prior to surgery.              Men may shave face and neck.   Do not bring valuables to the hospital. [place].  Contacts, dentures or bridgework may not be worn into surgery.  Leave suitcase in the car. After surgery it may be brought to your room.                   Please read over the following fact sheets you were given: _____________________________________________________________________             [facility] - Preparing for Surgery Before surgery, you can play an important role.  Because skin is not sterile, your skin needs to be as free of germs as possible.  You can reduce the number of germs on your skin by washing with CHG (chlorahexidine gluconate) soap before surgery.  CHG is an antiseptic cleaner which kills germs and bonds with the skin to continue killing germs even after washing. Please DO NOT use if you have an allergy to CHG or antibacterial soaps.  If your skin becomes reddened/irritated stop using the CHG and inform your nurse when you arrive at Short Stay. Do not shave (including legs and underarms) for at least 48 hours prior to the first CHG shower.  You may shave your face/neck. Please follow these instructions  carefully:  1.  Shower with CHG Soap the night before surgery and the  morning of Surgery.  2.  If you choose to wash your hair, wash your hair first as usual with your  normal  shampoo.  3.  After you shampoo, rinse your hair and body thoroughly to remove the  shampoo.                           4.  Use CHG as you would any other liquid soap.  You can apply chg directly  to the skin and wash                       Gently with a scrungie or clean washcloth.  5.  Apply the CHG Soap to your body ONLY FROM THE NECK DOWN.   Do not use on face/ open  Wound or open sores. Avoid contact with eyes, ears mouth and genitals (private parts).                       Wash face,  Genitals (private parts) with your normal soap.             6.  Wash thoroughly, paying special attention to the area where your surgery  will be performed.  7.  Thoroughly rinse your body with warm water from the neck down.  8.  DO NOT shower/wash with your normal soap after using and rinsing off  the CHG Soap.                9.  Pat yourself dry with a clean towel.            10.  Wear clean pajamas.            11.  Place clean sheets on your bed the night of your first shower and do not  sleep with pets. Day of Surgery : Do not apply any lotions/deodorants the morning of surgery.  Please wear clean clothes to the hospital/surgery center.  FAILURE TO FOLLOW THESE INSTRUCTIONS MAY RESULT IN THE CANCELLATION OF YOUR SURGERY PATIENT SIGNATURE_________________________________  NURSE SIGNATURE__________________________________  ________________________________________________________________________

## 2018-03-13 ENCOUNTER — Encounter (HOSPITAL_COMMUNITY): Payer: Self-pay

## 2018-03-13 ENCOUNTER — Encounter (HOSPITAL_COMMUNITY)
Admission: RE | Admit: 2018-03-13 | Discharge: 2018-03-13 | Disposition: A | Discharge status: Home / Self Care | Payer: Medicare HMO | Source: Ambulatory Visit | Attending: Orthopaedic Surgery | Admitting: Orthopaedic Surgery

## 2018-03-13 DIAGNOSIS — Z01812 Encounter for preprocedural laboratory examination: Secondary | ICD-10-CM | POA: Diagnosis not present

## 2018-03-13 HISTORY — DX: Unspecified rotator cuff tear or rupture of unspecified shoulder, not specified as traumatic: M75.100

## 2018-03-13 HISTORY — DX: Localized edema: R60.0

## 2018-03-13 LAB — CBC
HCT: 39.4 % (ref 39.0–52.0)
Hemoglobin: 13.4 g/dL (ref 13.0–17.0)
MCH: 31.8 pg (ref 26.0–34.0)
MCHC: 34 g/dL (ref 30.0–36.0)
MCV: 93.4 fL (ref 78.0–100.0)
PLATELETS: 226 10*3/uL (ref 150–400)
RBC: 4.22 MIL/uL (ref 4.22–5.81)
RDW: 13.5 % (ref 11.5–15.5)
WBC: 6.3 10*3/uL (ref 4.0–10.5)

## 2018-03-13 LAB — SURGICAL PCR SCREEN
MRSA, PCR: NEGATIVE
STAPHYLOCOCCUS AUREUS: NEGATIVE

## 2018-03-13 LAB — BASIC METABOLIC PANEL
Anion gap: 8 (ref 5–15)
BUN: 22 mg/dL (ref 8–23)
CO2: 25 mmol/L (ref 22–32)
CREATININE: 1.1 mg/dL (ref 0.61–1.24)
Calcium: 9 mg/dL (ref 8.9–10.3)
Chloride: 108 mmol/L (ref 98–111)
GFR calc Af Amer: >60 mL/min (ref >60)
GFR calc non Af Amer: >60 mL/min (ref >60)
Glucose, Bld: 186 mg/dL — ABNORMAL HIGH (ref 70–99)
Potassium: 4.2 mmol/L (ref 3.5–5.1)
Sodium: 141 mmol/L (ref 135–145)

## 2018-03-14 ENCOUNTER — Other Ambulatory Visit (INDEPENDENT_AMBULATORY_CARE_PROVIDER_SITE_OTHER): Payer: Self-pay | Admitting: Physician Assistant

## 2018-03-23 ENCOUNTER — Inpatient Hospital Stay (HOSPITAL_COMMUNITY): Payer: Medicare HMO | Admitting: Anesthesiology

## 2018-03-23 ENCOUNTER — Inpatient Hospital Stay (HOSPITAL_COMMUNITY)
Admission: RE | Admit: 2018-03-23 | Discharge: 2018-03-25 | DRG: 470 | Disposition: A | Discharge status: Home Health | Payer: Medicare HMO | Source: Ambulatory Visit | Attending: Orthopaedic Surgery | Admitting: Orthopaedic Surgery

## 2018-03-23 ENCOUNTER — Inpatient Hospital Stay (HOSPITAL_COMMUNITY): Payer: Medicare HMO

## 2018-03-23 ENCOUNTER — Other Ambulatory Visit: Payer: Self-pay

## 2018-03-23 ENCOUNTER — Encounter (HOSPITAL_COMMUNITY): Payer: Self-pay | Admitting: Emergency Medicine

## 2018-03-23 ENCOUNTER — Encounter (HOSPITAL_COMMUNITY)
Admission: RE | Disposition: A | Discharge status: Home Health | Payer: Self-pay | Source: Ambulatory Visit | Attending: Orthopaedic Surgery

## 2018-03-23 DIAGNOSIS — K219 Gastro-esophageal reflux disease without esophagitis: Secondary | ICD-10-CM | POA: Diagnosis present

## 2018-03-23 DIAGNOSIS — Z885 Allergy status to narcotic agent status: Secondary | ICD-10-CM

## 2018-03-23 DIAGNOSIS — K449 Diaphragmatic hernia without obstruction or gangrene: Secondary | ICD-10-CM | POA: Diagnosis present

## 2018-03-23 DIAGNOSIS — M659 Synovitis and tenosynovitis, unspecified: Secondary | ICD-10-CM | POA: Diagnosis present

## 2018-03-23 DIAGNOSIS — Z9189 Other specified personal risk factors, not elsewhere classified: Secondary | ICD-10-CM | POA: Diagnosis not present

## 2018-03-23 DIAGNOSIS — G2581 Restless legs syndrome: Secondary | ICD-10-CM | POA: Diagnosis present

## 2018-03-23 DIAGNOSIS — Z96642 Presence of left artificial hip joint: Secondary | ICD-10-CM | POA: Diagnosis present

## 2018-03-23 DIAGNOSIS — Z96652 Presence of left artificial knee joint: Secondary | ICD-10-CM | POA: Diagnosis present

## 2018-03-23 DIAGNOSIS — Z96692 Finger-joint replacement of left hand: Secondary | ICD-10-CM | POA: Diagnosis present

## 2018-03-23 DIAGNOSIS — M21161 Varus deformity, not elsewhere classified, right knee: Secondary | ICD-10-CM | POA: Diagnosis present

## 2018-03-23 DIAGNOSIS — Z79899 Other long term (current) drug therapy: Secondary | ICD-10-CM

## 2018-03-23 DIAGNOSIS — Z9989 Dependence on other enabling machines and devices: Secondary | ICD-10-CM

## 2018-03-23 DIAGNOSIS — M1711 Unilateral primary osteoarthritis, right knee: Principal | ICD-10-CM

## 2018-03-23 DIAGNOSIS — G4730 Sleep apnea, unspecified: Secondary | ICD-10-CM | POA: Diagnosis present

## 2018-03-23 DIAGNOSIS — Z96651 Presence of right artificial knee joint: Secondary | ICD-10-CM

## 2018-03-23 HISTORY — PX: TOTAL KNEE ARTHROPLASTY: SHX125

## 2018-03-23 IMAGING — DX DG KNEE 1-2V PORT*R*
2 series · 2 of 2 positions shown · non-contrast
Comparison: None.

CLINICAL DATA: Postop right knee replacement.

EXAM:
PORTABLE RIGHT KNEE - 1-2 VIEW

[knee ap]
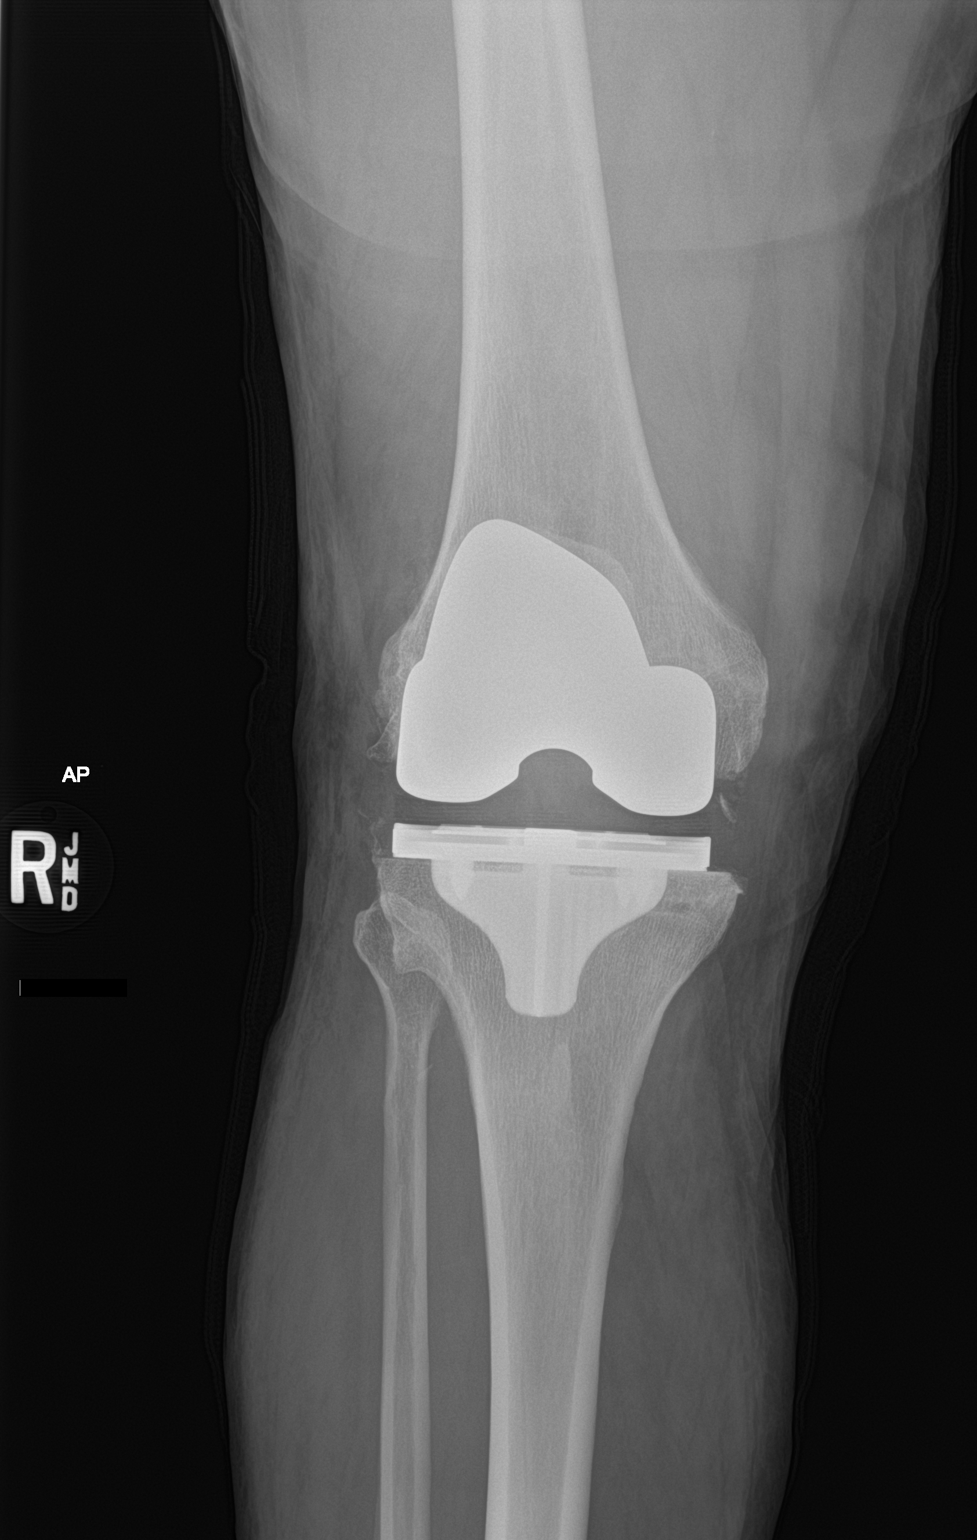

[knee lat]
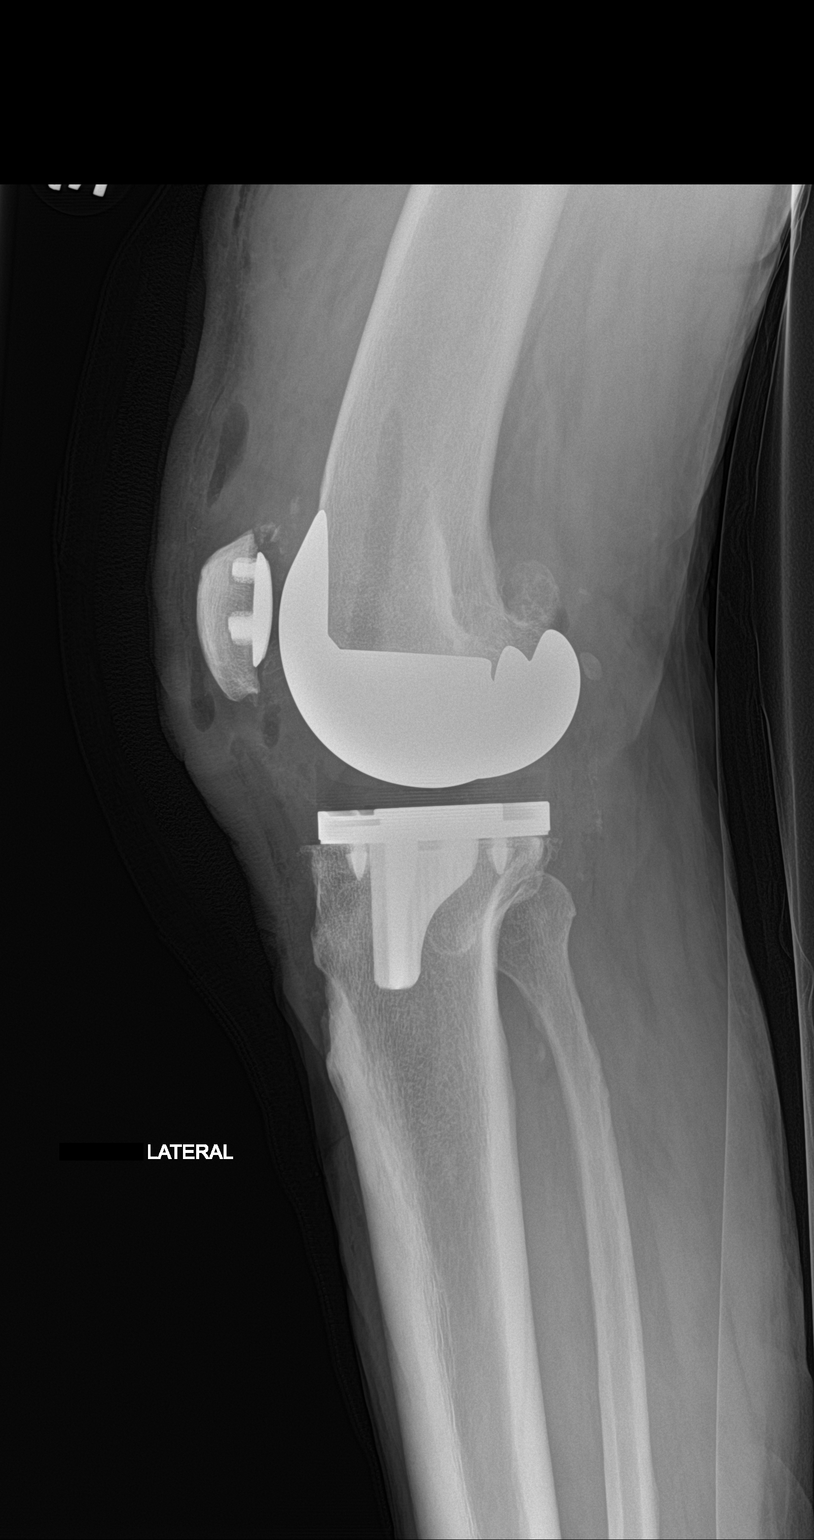

[2 of 2 positions shown; findings below may reference images not displayed]

FINDINGS: New right knee prosthetic components appear well seated and well
aligned. There is no acute fracture or evidence of an operative
complication.
IMPRESSION: Well-positioned total right knee arthroplasty.

## 2018-03-23 SURGERY — ARTHROPLASTY, KNEE, TOTAL
Anesthesia: Regional | Laterality: Right

## 2018-03-23 MED ORDER — PHENYTOIN SODIUM EXTENDED 100 MG PO CAPS
200.0000 mg | ORAL_CAPSULE | Freq: Every day | ORAL | Status: DC
  Administered 2018-03-23 – 2018-03-24 (×2): 200 mg via ORAL
  Filled 2018-03-23 (×2): qty 2

## 2018-03-23 MED ORDER — EPHEDRINE 5 MG/ML INJ
INTRAVENOUS | Status: AC
  Filled 2018-03-23: qty 10

## 2018-03-23 MED ORDER — ONDANSETRON HCL 4 MG PO TABS: 4.0000 mg | ORAL_TABLET | Freq: Four times a day (QID) | ORAL | Status: DC | PRN

## 2018-03-23 MED ORDER — MENTHOL 3 MG MT LOZG: 1.0000 | LOZENGE | OROMUCOSAL | Status: DC | PRN

## 2018-03-23 MED ORDER — BUPIVACAINE IN DEXTROSE 0.75-8.25 % IT SOLN
INTRATHECAL | Status: DC | PRN
  Administered 2018-03-23: 1.8 mL via INTRATHECAL

## 2018-03-23 MED ORDER — CEFAZOLIN SODIUM-DEXTROSE 2-4 GM/100ML-% IV SOLN
2.0000 g | INTRAVENOUS | Status: AC
  Administered 2018-03-23: 2 g via INTRAVENOUS
  Filled 2018-03-23: qty 100

## 2018-03-23 MED ORDER — FENTANYL CITRATE (PF) 100 MCG/2ML IJ SOLN
50.0000 ug | INTRAMUSCULAR | Status: DC
  Administered 2018-03-23: 50 ug via INTRAVENOUS
  Filled 2018-03-23: qty 2

## 2018-03-23 MED ORDER — TRANEXAMIC ACID 1000 MG/10ML IV SOLN
1000.0000 mg | INTRAVENOUS | Status: AC
  Administered 2018-03-23: 1000 mg via INTRAVENOUS
  Filled 2018-03-23: qty 10

## 2018-03-23 MED ORDER — DEXAMETHASONE SODIUM PHOSPHATE 10 MG/ML IJ SOLN
INTRAMUSCULAR | Status: AC
  Filled 2018-03-23: qty 1

## 2018-03-23 MED ORDER — SODIUM CHLORIDE 0.9 % IR SOLN
Status: DC | PRN
  Administered 2018-03-23: 1000 mL

## 2018-03-23 MED ORDER — ZOLPIDEM TARTRATE 5 MG PO TABS: 5.0000 mg | ORAL_TABLET | Freq: Every evening | ORAL | Status: DC | PRN

## 2018-03-23 MED ORDER — OXYCODONE HCL 5 MG/5ML PO SOLN: 5.0000 mg | Freq: Once | ORAL | Status: DC | PRN

## 2018-03-23 MED ORDER — OXYCODONE HCL 5 MG PO TABS: 5.0000 mg | ORAL_TABLET | ORAL | Status: DC | PRN

## 2018-03-23 MED ORDER — PROPOFOL 500 MG/50ML IV EMUL
INTRAVENOUS | Status: DC | PRN
  Administered 2018-03-23: 25 ug/kg/min via INTRAVENOUS

## 2018-03-23 MED ORDER — ROPIVACAINE HCL 7.5 MG/ML IJ SOLN
INTRAMUSCULAR | Status: DC | PRN
  Administered 2018-03-23: 20 mL via PERINEURAL

## 2018-03-23 MED ORDER — PHENYLEPHRINE HCL 10 MG/ML IJ SOLN
INTRAMUSCULAR | Status: AC
  Filled 2018-03-23: qty 1

## 2018-03-23 MED ORDER — ROPINIROLE HCL 1 MG PO TABS
3.0000 mg | ORAL_TABLET | Freq: Every day | ORAL | Status: DC
  Administered 2018-03-23 – 2018-03-24 (×2): 3 mg via ORAL
  Filled 2018-03-23 (×2): qty 3

## 2018-03-23 MED ORDER — RIVAROXABAN 10 MG PO TABS: 10.0000 mg | ORAL_TABLET | Freq: Every day | ORAL | Status: DC

## 2018-03-23 MED ORDER — ONDANSETRON HCL 4 MG/2ML IJ SOLN: 4.0000 mg | Freq: Four times a day (QID) | INTRAMUSCULAR | Status: DC | PRN

## 2018-03-23 MED ORDER — ACETAMINOPHEN 325 MG PO TABS: 325.0000 mg | ORAL_TABLET | Freq: Four times a day (QID) | ORAL | Status: DC | PRN

## 2018-03-23 MED ORDER — DEXAMETHASONE SODIUM PHOSPHATE 10 MG/ML IJ SOLN
INTRAMUSCULAR | Status: DC | PRN
  Administered 2018-03-23: 10 mg via INTRAVENOUS

## 2018-03-23 MED ORDER — PROPOFOL 10 MG/ML IV BOLUS
INTRAVENOUS | Status: AC
  Filled 2018-03-23: qty 40

## 2018-03-23 MED ORDER — CEFAZOLIN SODIUM-DEXTROSE 1-4 GM/50ML-% IV SOLN
1.0000 g | Freq: Four times a day (QID) | INTRAVENOUS | Status: AC
  Administered 2018-03-23 (×2): 1 g via INTRAVENOUS
  Filled 2018-03-23 (×2): qty 50

## 2018-03-23 MED ORDER — OXYCODONE HCL 5 MG PO TABS
10.0000 mg | ORAL_TABLET | ORAL | Status: DC | PRN
  Administered 2018-03-23: 10 mg via ORAL
  Administered 2018-03-23 (×2): 15 mg via ORAL
  Filled 2018-03-23: qty 3
  Filled 2018-03-23: qty 2
  Filled 2018-03-23: qty 3

## 2018-03-23 MED ORDER — ONDANSETRON HCL 4 MG/2ML IJ SOLN
INTRAMUSCULAR | Status: DC | PRN
  Administered 2018-03-23: 4 mg via INTRAVENOUS

## 2018-03-23 MED ORDER — DOCUSATE SODIUM 100 MG PO CAPS
100.0000 mg | ORAL_CAPSULE | Freq: Two times a day (BID) | ORAL | Status: DC
  Administered 2018-03-23 – 2018-03-25 (×4): 100 mg via ORAL
  Filled 2018-03-23 (×5): qty 1

## 2018-03-23 MED ORDER — ROPINIROLE HCL 1 MG PO TABS
1.0000 mg | ORAL_TABLET | Freq: Three times a day (TID) | ORAL | Status: DC
  Filled 2018-03-23: qty 1

## 2018-03-23 MED ORDER — SODIUM CHLORIDE 0.9 % IJ SOLN
INTRAMUSCULAR | Status: DC | PRN
  Administered 2018-03-23: 20 mL

## 2018-03-23 MED ORDER — FENTANYL CITRATE (PF) 100 MCG/2ML IJ SOLN
25.0000 ug | INTRAMUSCULAR | Status: DC | PRN
  Administered 2018-03-23 (×2): 50 ug via INTRAVENOUS

## 2018-03-23 MED ORDER — PHENOL 1.4 % MT LIQD: 1.0000 | OROMUCOSAL | Status: DC | PRN

## 2018-03-23 MED ORDER — SODIUM CHLORIDE 0.9 % IV SOLN
INTRAVENOUS | Status: DC | PRN
  Administered 2018-03-23: 25 ug/min via INTRAVENOUS

## 2018-03-23 MED ORDER — EPHEDRINE SULFATE 50 MG/ML IJ SOLN
INTRAMUSCULAR | Status: DC | PRN
  Administered 2018-03-23 (×2): 5 mg via INTRAVENOUS

## 2018-03-23 MED ORDER — ALUM & MAG HYDROXIDE-SIMETH 200-200-20 MG/5ML PO SUSP: 30.0000 mL | ORAL | Status: DC | PRN

## 2018-03-23 MED ORDER — ROPINIROLE HCL 1 MG PO TABS
1.0000 mg | ORAL_TABLET | Freq: Three times a day (TID) | ORAL | Status: DC
  Administered 2018-03-23 – 2018-03-25 (×7): 1 mg via ORAL
  Filled 2018-03-23 (×6): qty 1

## 2018-03-23 MED ORDER — 0.9 % SODIUM CHLORIDE (POUR BTL) OPTIME
TOPICAL | Status: DC | PRN
  Administered 2018-03-23: 1000 mL

## 2018-03-23 MED ORDER — POLYETHYLENE GLYCOL 3350 17 G PO PACK: 17.0000 g | PACK | Freq: Every day | ORAL | Status: DC | PRN

## 2018-03-23 MED ORDER — METHOCARBAMOL 500 MG IVPB - SIMPLE MED
500.0000 mg | Freq: Four times a day (QID) | INTRAVENOUS | Status: DC | PRN
  Administered 2018-03-23: 500 mg via INTRAVENOUS
  Filled 2018-03-23: qty 50

## 2018-03-23 MED ORDER — METHOCARBAMOL 500 MG IVPB - SIMPLE MED
INTRAVENOUS | Status: AC
  Administered 2018-03-23: 500 mg via INTRAVENOUS
  Filled 2018-03-23: qty 50

## 2018-03-23 MED ORDER — SODIUM CHLORIDE 0.9 % IV SOLN
INTRAVENOUS | Status: DC
  Administered 2018-03-23 – 2018-03-24 (×2): via INTRAVENOUS

## 2018-03-23 MED ORDER — METHOCARBAMOL 500 MG PO TABS
500.0000 mg | ORAL_TABLET | Freq: Four times a day (QID) | ORAL | Status: DC | PRN
  Administered 2018-03-23 – 2018-03-24 (×2): 500 mg via ORAL
  Filled 2018-03-23 (×3): qty 1

## 2018-03-23 MED ORDER — PHENYTOIN SODIUM EXTENDED 100 MG PO CAPS
100.0000 mg | ORAL_CAPSULE | Freq: Every day | ORAL | Status: DC
  Administered 2018-03-24 – 2018-03-25 (×2): 100 mg via ORAL
  Filled 2018-03-23 (×2): qty 1

## 2018-03-23 MED ORDER — SODIUM CHLORIDE 0.9 % IJ SOLN
INTRAMUSCULAR | Status: AC
  Filled 2018-03-23: qty 20

## 2018-03-23 MED ORDER — BUPIVACAINE LIPOSOME 1.3 % IJ SUSP
20.0000 mL | Freq: Once | INTRAMUSCULAR | Status: AC
  Administered 2018-03-23: 20 mL
  Filled 2018-03-23: qty 20

## 2018-03-23 MED ORDER — CHLORHEXIDINE GLUCONATE 4 % EX LIQD: 60.0000 mL | Freq: Once | CUTANEOUS | Status: DC

## 2018-03-23 MED ORDER — FENTANYL CITRATE (PF) 100 MCG/2ML IJ SOLN
INTRAMUSCULAR | Status: AC
  Administered 2018-03-23: 50 ug via INTRAVENOUS
  Filled 2018-03-23: qty 2

## 2018-03-23 MED ORDER — ASPIRIN EC 325 MG PO TBEC
325.0000 mg | DELAYED_RELEASE_TABLET | Freq: Two times a day (BID) | ORAL | Status: DC
  Administered 2018-03-23 – 2018-03-25 (×4): 325 mg via ORAL
  Filled 2018-03-23 (×4): qty 1

## 2018-03-23 MED ORDER — STERILE WATER FOR IRRIGATION IR SOLN
Status: DC | PRN
  Administered 2018-03-23: 2000 mL

## 2018-03-23 MED ORDER — KETOROLAC TROMETHAMINE 15 MG/ML IJ SOLN
15.0000 mg | Freq: Four times a day (QID) | INTRAMUSCULAR | Status: DC
  Administered 2018-03-23 – 2018-03-25 (×8): 15 mg via INTRAVENOUS
  Filled 2018-03-23 (×8): qty 1

## 2018-03-23 MED ORDER — HYDROMORPHONE HCL 1 MG/ML IJ SOLN
1.0000 mg | INTRAMUSCULAR | Status: DC | PRN
  Administered 2018-03-23: 1 mg via INTRAVENOUS
  Filled 2018-03-23 (×2): qty 1

## 2018-03-23 MED ORDER — DIPHENHYDRAMINE HCL 12.5 MG/5ML PO ELIX
12.5000 mg | ORAL_SOLUTION | ORAL | Status: DC | PRN
  Administered 2018-03-23: 12.5 mg via ORAL
  Filled 2018-03-23: qty 5

## 2018-03-23 MED ORDER — LACTATED RINGERS IV SOLN
INTRAVENOUS | Status: DC
  Administered 2018-03-23 (×2): via INTRAVENOUS

## 2018-03-23 MED ORDER — PANTOPRAZOLE SODIUM 40 MG PO TBEC
40.0000 mg | DELAYED_RELEASE_TABLET | Freq: Every day | ORAL | Status: DC
  Administered 2018-03-24 – 2018-03-25 (×2): 40 mg via ORAL
  Filled 2018-03-23 (×2): qty 1

## 2018-03-23 MED ORDER — OXYCODONE HCL 5 MG PO TABS: 5.0000 mg | ORAL_TABLET | Freq: Once | ORAL | Status: DC | PRN

## 2018-03-23 MED ORDER — ONDANSETRON HCL 4 MG/2ML IJ SOLN
INTRAMUSCULAR | Status: AC
  Filled 2018-03-23: qty 2

## 2018-03-23 MED ORDER — MIDAZOLAM HCL 2 MG/2ML IJ SOLN
1.0000 mg | INTRAMUSCULAR | Status: DC
  Administered 2018-03-23: 1 mg via INTRAVENOUS
  Filled 2018-03-23: qty 2

## 2018-03-23 MED ORDER — METOCLOPRAMIDE HCL 5 MG PO TABS: 5.0000 mg | ORAL_TABLET | Freq: Three times a day (TID) | ORAL | Status: DC | PRN

## 2018-03-23 MED ORDER — METOCLOPRAMIDE HCL 5 MG/ML IJ SOLN: 5.0000 mg | Freq: Three times a day (TID) | INTRAMUSCULAR | Status: DC | PRN

## 2018-03-23 SURGICAL SUPPLY — 64 items
APL SKNCLS STERI-STRIP NONHPOA (GAUZE/BANDAGES/DRESSINGS) ×1
BAG SPEC THK2 15X12 ZIP CLS (MISCELLANEOUS)
BAG ZIPLOCK 12X15 (MISCELLANEOUS) IMPLANT
BANDAGE ACE 6X5 VEL STRL LF (GAUZE/BANDAGES/DRESSINGS) ×5 IMPLANT
BEARIN TIBIAL TRIATH (Orthopedic Implant) ×3 IMPLANT
BEARING TIBIAL TRIATH (Orthopedic Implant) IMPLANT
BENZOIN TINCTURE PRP APPL 2/3 (GAUZE/BANDAGES/DRESSINGS) ×2 IMPLANT
BLADE SAG 18X100X1.27 (BLADE) IMPLANT
BNDG COHESIVE 4X5 TAN STRL (GAUZE/BANDAGES/DRESSINGS) ×2 IMPLANT
BOWL SMART MIX CTS (DISPOSABLE) IMPLANT
BSPLAT TIB 4 KN TRITANIUM (Knees) ×1 IMPLANT
CLOSURE WOUND 1/2 X4 (GAUZE/BANDAGES/DRESSINGS)
COVER BACK TABLE 60X90IN (DRAPES) ×2 IMPLANT
COVER SURGICAL LIGHT HANDLE (MISCELLANEOUS) ×3 IMPLANT
CUFF TOURN SGL QUICK 34 (TOURNIQUET CUFF) ×3
CUFF TRNQT CYL 34X4X40X1 (TOURNIQUET CUFF) ×1 IMPLANT
DECANTER SPIKE VIAL GLASS SM (MISCELLANEOUS) IMPLANT
DRAPE SHEET LG 3/4 BI-LAMINATE (DRAPES) ×2 IMPLANT
DRAPE U-SHAPE 47X51 STRL (DRAPES) ×3 IMPLANT
DRSG PAD ABDOMINAL 8X10 ST (GAUZE/BANDAGES/DRESSINGS) ×3 IMPLANT
DURAPREP 26ML APPLICATOR (WOUND CARE) ×3 IMPLANT
ELECT REM PT RETURN 15FT ADLT (MISCELLANEOUS) ×3 IMPLANT
FEMORAL POSTERIOR SZ5 RT (Femur) IMPLANT
GAUZE SPONGE 4X4 12PLY STRL (GAUZE/BANDAGES/DRESSINGS) ×3 IMPLANT
GAUZE XEROFORM 1X8 LF (GAUZE/BANDAGES/DRESSINGS) IMPLANT
GLOVE BIO SURGEON STRL SZ7.5 (GLOVE) ×5 IMPLANT
GLOVE BIOGEL PI IND STRL 6.5 (GLOVE) IMPLANT
GLOVE BIOGEL PI IND STRL 7.0 (GLOVE) IMPLANT
GLOVE BIOGEL PI IND STRL 7.5 (GLOVE) IMPLANT
GLOVE BIOGEL PI IND STRL 8 (GLOVE) ×2 IMPLANT
GLOVE BIOGEL PI INDICATOR 6.5 (GLOVE) ×4
GLOVE BIOGEL PI INDICATOR 7.0 (GLOVE) ×8
GLOVE BIOGEL PI INDICATOR 7.5 (GLOVE) ×8
GLOVE BIOGEL PI INDICATOR 8 (GLOVE) ×4
GLOVE ECLIPSE 8.0 STRL XLNG CF (GLOVE) ×3 IMPLANT
GLOVE SURG SS PI 7.0 STRL IVOR (GLOVE) ×12 IMPLANT
GLOVE SURG SS PI 7.5 STRL IVOR (GLOVE) ×8 IMPLANT
GOWN STRL REUS W/TWL XL LVL3 (GOWN DISPOSABLE) ×12 IMPLANT
HANDPIECE INTERPULSE COAX TIP (DISPOSABLE) ×3
HOLDER FOLEY CATH W/STRAP (MISCELLANEOUS) ×2 IMPLANT
IMMOBILIZER KNEE 20 (SOFTGOODS) ×3
IMMOBILIZER KNEE 20 THIGH 36 (SOFTGOODS) ×1 IMPLANT
KNEE PATELLA ASYMMETRIC 9X29 (Knees) ×2 IMPLANT
KNEE TIBIAL COMP TRI SZ4 (Knees) ×2 IMPLANT
NDL SAFETY ECLIPSE 18X1.5 (NEEDLE) IMPLANT
NEEDLE HYPO 18GX1.5 SHARP (NEEDLE) ×3
PACK TOTAL KNEE CUSTOM (KITS) ×3 IMPLANT
PADDING CAST COTTON 6X4 STRL (CAST SUPPLIES) ×6 IMPLANT
POSITIONER SURGICAL ARM (MISCELLANEOUS) ×3 IMPLANT
POSTERIOR FEMORAL SZ5 RT (Femur) ×3 IMPLANT
SET HNDPC FAN SPRY TIP SCT (DISPOSABLE) ×1 IMPLANT
SET PAD KNEE POSITIONER (MISCELLANEOUS) ×3 IMPLANT
STAPLER VISISTAT 35W (STAPLE) IMPLANT
STRIP CLOSURE SKIN 1/2X4 (GAUZE/BANDAGES/DRESSINGS) IMPLANT
SUT MNCRL AB 4-0 PS2 18 (SUTURE) ×2 IMPLANT
SUT VIC AB 0 CT1 27 (SUTURE) ×3
SUT VIC AB 0 CT1 27XBRD ANTBC (SUTURE) ×1 IMPLANT
SUT VIC AB 1 CT1 36 (SUTURE) ×6 IMPLANT
SUT VIC AB 2-0 CT1 27 (SUTURE) ×6
SUT VIC AB 2-0 CT1 TAPERPNT 27 (SUTURE) ×2 IMPLANT
SYR 3ML LL SCALE MARK (SYRINGE) ×2 IMPLANT
TRAY FOLEY MTR SLVR 16FR STAT (SET/KITS/TRAYS/PACK) ×3 IMPLANT
WRAP KNEE MAXI GEL POST OP (GAUZE/BANDAGES/DRESSINGS) ×3 IMPLANT
YANKAUER SUCT BULB TIP 10FT TU (MISCELLANEOUS) ×3 IMPLANT

## 2018-03-23 NOTE — Anesthesia Preprocedure Evaluation (Addendum)
Anesthesia Evaluation  Patient identified by MRN, date of birth, ID band Patient awake    Reviewed: Allergy & Precautions, H&P , NPO status , Patient's Chart, lab work & pertinent test results  Airway Mallampati: II   Neck ROM: full    Dental   Pulmonary sleep apnea ,    breath sounds clear to auscultation       Cardiovascular negative cardio ROS   Rhythm:regular Rate:Normal     Neuro/Psych  Headaches,    GI/Hepatic hiatal hernia, GERD  ,  Endo/Other    Renal/GU      Musculoskeletal  (+) Arthritis ,   Abdominal   Peds  Hematology   Anesthesia Other Findings   Reproductive/Obstetrics                             Anesthesia Physical Anesthesia Plan  ASA: II  Anesthesia Plan: Spinal and Regional   Post-op Pain Management:  Regional for Post-op pain   Induction: Intravenous  PONV Risk Score and Plan: 1 and Ondansetron, Dexamethasone, Propofol infusion and Treatment may vary due to age or medical condition  Airway Management Planned: Simple Face Mask  Additional Equipment:   Intra-op Plan:   Post-operative Plan:   Informed Consent: I have reviewed the patients History and Physical, chart, labs and discussed the procedure including the risks, benefits and alternatives for the proposed anesthesia with the patient or authorized representative who has indicated his/her understanding and acceptance.     Plan Discussed with: CRNA, Surgeon and Anesthesiologist  Anesthesia Plan Comments:         Anesthesia Quick Evaluation

## 2018-03-23 NOTE — Evaluation (Addendum)
Physical Therapy Evaluation Patient Details Name: Manuel Hunter MRN: [id number] DOB: May 12, 1941 Today's Date: 03/23/2018   History of Present Illness  77 YO male s/p R TKR on 03/23/18. PMH includes OA, headaches, GERD, hiatial hernia, LE edema, prostate cancer and surgery 2007, RLS, R RTC tear, sleep apnea. Past surgical history includes L finger arthroplasty 2018, knee surgery 2009, cervical surgery, 2 RTC repairs bilat, L THA 2018.  Clinical Impression   Pt presents with difficulty performing bed mobility/transfers/gait, decreased activity tolerance for gait, decreased balance, and R knee pain. Pt to benefit from acute PT to address deficits. Pt ambulated 60 ft with min guard assist today, and tolerated this well. PT to progress mobility as tolerated, will continue to follow acutely.     Follow Up Recommendations Follow surgeon's recommendation for DC plan and follow-up therapies;Supervision for mobility/OOB(HHPT)    Equipment Recommendations  None recommended by PT    Recommendations for Other Services       Precautions / Restrictions Precautions Precautions: Fall Restrictions Weight Bearing Restrictions: No Other Position/Activity Restrictions: WBAT       Mobility  Bed Mobility Overal bed mobility: Needs Assistance Bed Mobility: Supine to Sit     Supine to sit: Min assist;HOB elevated     General bed mobility comments: Min assist for bilat LE management, trunk elevation, steadying on EOB, and scooting to EOB. Verbal cuing for sequencing throughout.   Transfers Overall transfer level: Needs assistance Equipment used: Rolling walker (2 wheeled) Transfers: Sit to/from Stand Sit to Stand: Min assist         General transfer comment: Min assist for power up and steadying upon standing. Increased time to perform.   Ambulation/Gait Ambulation/Gait assistance: Min guard Gait Distance (Feet): 60 Feet Assistive device: Rolling walker (2 wheeled) Gait  Pattern/deviations: Step-to pattern;Decreased weight shift to right;Decreased stance time - right;Antalgic;Trunk flexed Gait velocity: Pt reminded to slow steps for safe performance of gait for first time up    General Gait Details: Min guard for safety. Verbal cuing for sequencing and stepping into RW.   Stairs            Wheelchair Mobility    Modified Rankin (Stroke Patients Only)       Balance Overall balance assessment: Needs assistance Sitting-balance support: Feet supported;No upper extremity supported Sitting balance-Leahy Scale: Fair     Standing balance support: Bilateral upper extremity supported Standing balance-Leahy Scale: Poor Standing balance comment: relies on RW for steadying and support in standing                              Pertinent Vitals/Pain Pain Assessment: 0-10 Pain Score: 5  Pain Location: R knee  Pain Descriptors / Indicators: Sore;Operative site guarding Pain Intervention(s): Limited activity within patient's tolerance;Ice applied;Monitored during session;Premedicated before session    Home Living Family/patient expects to be discharged to:: Private residence Living Arrangements: Spouse/significant other Available Help at Discharge: Family;Available PRN/intermittently Type of Home: House Home Access: Stairs to enter Entrance Stairs-Rails: Can reach both;Left;Right Entrance Stairs-Number of Steps: 2 Home Layout: One level Home Equipment: Walker - 2 wheels;Cane - single point;Bedside commode      Prior Function Level of Independence: Independent         Comments: not using ADs prior to admission      Hand Dominance   Dominant Hand: Right    Extremity/Trunk Assessment   Upper Extremity Assessment Upper Extremity Assessment: Overall WFL for  tasks assessed    Lower Extremity Assessment Lower Extremity Assessment: Overall WFL for tasks assessed;RLE deficits/detail RLE Deficits / Details: Suspected post-surgical  RLE weakness; able to perform quad sets x10, ankle pumps, SLR with active assist during mobility RLE Sensation: WNL    Cervical / Trunk Assessment Cervical / Trunk Assessment: Normal  Communication   Communication: No difficulties  Cognition Arousal/Alertness: Awake/alert Behavior During Therapy: WFL for tasks assessed/performed Overall Cognitive Status: Within Functional Limits for tasks assessed                                        General Comments      Exercises Total Joint Exercises Ankle Circles/Pumps: AROM;Both;10 reps;Supine Quad Sets: AROM;Right;10 reps;Supine Heel Slides: AROM;Right;5 reps;Seated   Assessment/Plan    PT Assessment Patient needs continued PT services  PT Problem List Decreased strength;Pain;Decreased range of motion;Decreased activity tolerance;Decreased knowledge of use of DME;Decreased balance;Decreased safety awareness;Decreased mobility       PT Treatment Interventions Therapeutic activities;DME instruction;Gait training;Therapeutic exercise;Patient/family education;Stair training;Balance training;Functional mobility training    PT Goals (Current goals can be found in the Care Plan section)  Acute Rehab PT Goals PT Goal Formulation: With patient Time For Goal Achievement: 04/06/18 Potential to Achieve Goals: Good    Frequency 7X/week   Barriers to discharge        Co-evaluation               AM-PAC PT "6 Clicks" Daily Activity  Outcome Measure Difficulty turning over in bed (including adjusting bedclothes, sheets and blankets)?: Unable Difficulty moving from lying on back to sitting on the side of the bed? : Unable Difficulty sitting down on and standing up from a chair with arms (e.g., wheelchair, bedside commode, etc,.)?: Unable Help needed moving to and from a bed to chair (including a wheelchair)?: A Little Help needed walking in hospital room?: A Little Help needed climbing 3-5 steps with a railing? : A  Lot 6 Click Score: 11    End of Session Equipment Utilized During Treatment: Gait belt, R knee immobilizer Activity Tolerance: Patient tolerated treatment well;No increased pain;Patient limited by fatigue Patient left: in chair;with chair alarm set;with call bell/phone within reach;with family/visitor present;with SCD's reapplied Nurse Communication: Mobility status PT Visit Diagnosis: Unsteadiness on feet (R26.81);Difficulty in walking, not elsewhere classified (R26.2)    Time: 3500-9381 PT Time Calculation (min) (ACUTE ONLY): 35 min   Charges:   PT Evaluation $PT Eval Low Complexity: 1 Low PT Treatments $Gait Training: 8-22 mins        Manuel Hunter, PT, DPT  Pager # [phone]    Manuel Hunter 03/23/2018, 4:54 PM

## 2018-03-23 NOTE — Progress Notes (Signed)
Assisted Dr. Hodierne with right, ultrasound guided, adductor canal block. Side rails up, monitors on throughout procedure. See vital signs in flow sheet. Tolerated Procedure well.  

## 2018-03-23 NOTE — Op Note (Signed)
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  PROCEDURE:  Right total knee arthroplasty.  IMPLANTS:  Stryker press-fit knee system with size 5 femur, size 4 tibial tray, 11 mm fixed bearing polyethylene insert, size 29 patellar button.  SURGEON:  Lind Guest. Ninfa Linden, MD  ASSISTANT:  Erskine Emery, PA-C  ANESTHESIA: 1.  Right lower extremity adductor canal block. 2.  Spinal.  TOURNIQUET TIME:  Under 1 hour.  ANTIBIOTICS:  Two grams IV Ancef.  ESTIMATED BLOOD LOSS:  Less than 100 mL.  COMPLICATIONS:  None.  INDICATIONS:  The patient is a very pleasant 77 year old gentleman well known to me.  He has debilitating arthritis involving his right knee.  He has actually had a left total hip arthroplasty and a left total knee arthroplasty remotely.  His right knee  has significant varus malalignment.  His arthritis is severe.  At this point, his pain is daily and it is 10/10.  It has detrimentally affected his activities of daily living, his quality of life, his mobility.  I have known him for a long period of  time.  He has tried all forms of conservative treatment.  At this point, he does wish to proceed with a total knee arthroplasty on the right side.  He understands fully having had this done before the risk of acute blood loss anemia, nerve or vessel  injury, fracture, infection, DVT and implant failure.  He understands our goals are to decrease pain, improve mobility and overall improve quality of life.  DESCRIPTION OF PROCEDURE:  After informed consent was  obtained, the right knee was marked.  An adductor canal block was obtained in the holding room.  He was then brought to the operating room and placed supine on the operating table and sat up where  spinal anesthesia was obtained.  He was then laid in the supine position.  A Foley catheter was placed and a nonsterile tourniquet was placed on his upper right thigh.  His right thigh, knee, leg and ankle were prepped and draped with DuraPrep and  sterile drapes including a sterile stockinette.  Time-out was called to identify correct patient, correct right knee.  We then used an Esmarch to wrap the leg, and tourniquet was inflated to 300 mm of pressure.  I then made a direct midline incision over  the patella and carried this proximally and distally.  I did dissect down the knee joint and carried out a medial parapatellar arthrotomy, finding a very large joint effusion and significant periarticular osteoarthritis with osteophytes throughout the  knee, synovitis, and significant degenerative changes and loose bodies.  We removed all these aspects of the knee.  We then had the knee in a flexed position using the extramedullary cutting guide for the tibia.  We made our proximal tibia cut, setting  our intramedullary guide for correction of varus and valgus and neutral slope and making a 9 mm proximal tibia cut based off the high side.  We made this cut without difficulty.  We then went to the femur.  Using an intramedullary guide for making our  distal femoral cut, placed on the cutting guide for right knee at 5 degrees externally  rotated. This was also for a 10 mm distal femoral cut.  We made this cut without difficulty and brought the knee back down to full extension.  We achieved full  extension with a 9 mm extension block.  He was only slightly loose.  We then went back to the femur.  With the knee in a flexed position, we put our femoral sizing guide based off the epicondylar axis and Whites[place] line.  We chose  a size femur based off  of this.  We made a 4-in-1 cutting block for size femur and made our anterior and posterior cuts followed by our chamfer cuts and then made our femoral box cut for a size 5 femur.  We then went back to the tibia.  For the tibia, we chose a size 4 for  coverage, setting the rotation off the tibial tubercle and the femur.  We placed our keel punch off of this.  Based on his nice bone quality, bone stock, and hard bone, we went with a press-fit knee system.  We then placed our trial 4 tibia followed by  our trial 5 right femur and placed our 9 mm fixed bearing polyethylene insert, and then we went up to an 11 mm insert.  I appreciated the stability more of the 11 mm insert.  We then made our patellar cut and drilled 3 holes for a press-fit size 29  patellar button.  We then removed all instrumentation from the knee.  We irrigated the knee with normal saline solution using pulsatile lavage.  We then dried the knee really well and placed a mixture of 20 mL Exparel and 20 mL of saline throughout the  knee capsule.  We then dried the knee really well and then placed our real Stryker press-fit tibial tray size 4 followed by our size 5 right press-fit femur.  We placed our real 11 mm fixed bearing polyethylene insert and press-fit our size 29 patellar  button.  We put the knee through a range of motion.  I was very pleased with stability.  We then closed the arthrotomy with interrupted #1 Vicryl suture followed by 0 Vicryl in the deep tissue, 2-0 Vicryl subcutaneous tissue, 4-0 Monocryl subcuticular  stitch and Steri-Strips on the skin.  A well-padded sterile dressing was applied, and he was taken to recovery room in stable condition.  All final counts were correct.  There were no complications noted.  Of note, Benita Stabile, PA-C, assisted the entire  case.  His assistance was crucial for facilitating all aspects of this case.  LN/NUANCE  D:03/23/2018 T:03/23/2018 JOB:[id number]

## 2018-03-23 NOTE — Anesthesia Postprocedure Evaluation (Signed)
Anesthesia Post Note  Patient: Manuel Hunter  Procedure(s) Performed: RIGHT TOTAL KNEE ARTHROPLASTY (Right )     Patient location during evaluation: PACU Anesthesia Type: Regional, MAC and Spinal Level of consciousness: oriented and awake and alert Pain management: pain level controlled Vital Signs Assessment: post-procedure vital signs reviewed and stable Respiratory status: spontaneous breathing, respiratory function stable and patient connected to nasal cannula oxygen Cardiovascular status: blood pressure returned to baseline and stable Postop Assessment: no headache, no backache and no apparent nausea or vomiting Anesthetic complications: no    Last Vitals:  Vitals:   03/23/18 1349 03/23/18 1438  BP: (!) 158/66 (!) 167/73  Pulse: (!) 56 (!) 55  Resp:  14  Temp: 36.7 C 36.5 C  SpO2: 97% 100%    Last Pain:  Vitals:   03/23/18 1438  TempSrc: Oral  PainSc:                  [place] S

## 2018-03-23 NOTE — H&P (Signed)
TOTAL KNEE ADMISSION H&P  Patient is being admitted for right total knee arthroplasty.  Subjective:  Chief Complaint:right knee pain.  HPI: Manuel Hunter, 77 y.o. male, has a history of pain and functional disability in the right knee due to arthritis and has failed non-surgical conservative treatments for greater than 12 weeks to includeNSAID's and/or analgesics, corticosteriod injections, viscosupplementation injections, flexibility and strengthening excercises, weight reduction as appropriate and activity modification.  Onset of symptoms was gradual, starting 4 years ago with gradually worsening course since that time. The patient noted no past surgery on the right knee(s).  Patient currently rates pain in the right knee(s) at 10 out of 10 with activity. Patient has night pain, worsening of pain with activity and weight bearing, pain that interferes with activities of daily living, pain with passive range of motion, crepitus and joint swelling.  Patient has evidence of subchondral sclerosis, periarticular osteophytes and joint space narrowing by imaging studies. There is no active infection.  Patient Active Problem List   Diagnosis Date Noted  . Unilateral primary osteoarthritis, right knee 03/23/2018  . 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 Status post left hip replacement 09/13/2016   Past Medical History:  Diagnosis Date  . Arthritis   . Complication of anesthesia    States "I woke up to soon one time" with rotator cuff repair  . GERD (gastroesophageal reflux disease)   . Headache   . History of hiatal hernia   . Lower extremity edema    occasionally takes furosemide prn  . Prostate cancer ([place]) 2007   tx with surgery  . RLS (restless legs syndrome)   . Rotator cuff tear    right  . Sleep apnea    uses CPAP nightly    Past Surgical History:  Procedure Laterality Date  . FINGER ARTHROPLASTY Left 03/10/2017   Procedure: Left index finger metacarpal  phalangeal arthroplasty;  Surgeon: Roseanne Kaufman, MD;  Location: [facility];  Service: Orthopedics;  Laterality: Left;  90 mins  . KNEE SURGERY Bilateral   . left knee replacment  2009  . MANDIBLE FRACTURE SURGERY     left 2 titanium plates and 10 scres  . NECK SURGERY     cervical with bone graft  . PROSTATE SURGERY     prostatctomy  . ROTATOR CUFF REPAIR     x 2 right x 2 left  . TOTAL HIP ARTHROPLASTY Left 09/13/2016   Procedure: LEFT TOTAL HIP ARTHROPLASTY ANTERIOR APPROACH;  Surgeon: [name], MD;  Location: [facility];  Service: Orthopedics;  Laterality: Left;    Current Facility-Administered Medications  Medication Dose Route Frequency Provider Last Rate Last Dose  . ceFAZolin (ANCEF) IVPB 2g/100 mL premix  2 g Intravenous On Call to OR Pete Pelt, PA-C      . chlorhexidine (HIBICLENS) 4 % liquid 4 application  60 mL Topical Once Erskine Emery W, PA-C      . fentaNYL (SUBLIMAZE) injection 50-100 mcg  50-100 mcg Intravenous UD Albertha Ghee, MD      . lactated ringers infusion   Intravenous Continuous Albertha Ghee, MD 50 mL/hr at 03/23/18 0703    . midazolam (VERSED) injection 1-2 mg  1-2 mg Intravenous UD Hodierne, Adam, MD      . tranexamic acid (CYKLOKAPRON) 1,000 mg in sodium chloride 0.9 % 100 mL IVPB  1,000 mg Intravenous To OR Pete Pelt, PA-C       Allergies  Allergen Reactions  .  Morphine Nausea And Vomiting    Social History   Tobacco Use  . Smoking status: Never Smoker  . Smokeless tobacco: Never Used  Substance Use Topics  . Alcohol use: Never    Frequency: Never    History reviewed. No pertinent family history.   Review of Systems  Musculoskeletal: Positive for joint pain.  All other systems reviewed and are negative.   Objective:  Physical Exam  Constitutional: He is oriented to person, place, and time. He appears well-developed and well-nourished.  HENT:  Head: Normocephalic and atraumatic.  Eyes: Pupils  are equal, round, and reactive to light. EOM are normal.  Neck: Normal range of motion. Neck supple.  Cardiovascular: Normal rate and regular rhythm.  Respiratory: Effort normal and breath sounds normal.  GI: Soft. Bowel sounds are normal.  Musculoskeletal:       Right knee: He exhibits decreased range of motion, swelling, effusion and abnormal alignment. Tenderness found. Medial joint line and lateral joint line tenderness noted.  Neurological: He is alert and oriented to person, place, and time.  Skin: Skin is warm and dry.  Psychiatric: He has a normal mood and affect.    Vital signs in last 24 hours: Temp:  [98.1 F (36.7 C)] 98.1 F (36.7 C) (09/06 0652) Pulse Rate:  [57] 57 (09/06 0652) Resp:  [16] 16 (09/06 0652) BP: (139)/(69) 139/69 (09/06 0652) SpO2:  [97 %] 97 % (09/06 0652)  Labs:   Estimated body mass index is 37.61 kg/m as calculated from the following:   Height as of 03/13/18: 5\' 6"  (1.676 m).   Weight as of 03/13/18: 105.7 kg.   Imaging Review Plain radiographs demonstrate severe degenerative joint disease of the right knee(s). The overall alignment ismild varus. The bone quality appears to be excellent for age and reported activity level.   Preoperative templating of the joint replacement has been completed, documented, and submitted to the Operating Room personnel in order to optimize intra-operative equipment management.    Patient's anticipated LOS is less than 2 midnights, meeting these requirements: - Younger than 56 - Lives within 1 hour of care - Has a competent adult at home to recover with post-op recover - NO history of  - Chronic pain requiring opiods  - Diabetes  - Coronary Artery Disease  - Heart failure  - Heart attack  - Stroke  - DVT/VTE  - Cardiac arrhythmia  - Respiratory Failure/COPD  - Renal failure  - Anemia  - Advanced Liver disease        Assessment/Plan:  End stage arthritis, right knee   The patient history,  physical examination, clinical judgment of the provider and imaging studies are consistent with end stage degenerative joint disease of the right knee(s) and total knee arthroplasty is deemed medically necessary. The treatment options including medical management, injection therapy arthroscopy and arthroplasty were discussed at length. The risks and benefits of total knee arthroplasty were presented and reviewed. The risks due to aseptic loosening, infection, stiffness, patella tracking problems, thromboembolic complications and other imponderables were discussed. The patient acknowledged the explanation, agreed to proceed with the plan and consent was signed. Patient is being admitted for inpatient treatment for surgery, pain control, PT, OT, prophylactic antibiotics, VTE prophylaxis, progressive ambulation and ADL's and discharge planning. The patient is planning to be discharged home with home health services

## 2018-03-23 NOTE — Transfer of Care (Signed)
Immediate Anesthesia Transfer of Care Note  Patient: Manuel Hunter  Procedure(s) Performed: RIGHT TOTAL KNEE ARTHROPLASTY (Right )  Patient Location: PACU  Anesthesia Type:Spinal and MAC combined with regional for post-op pain  Level of Consciousness: awake, alert  and oriented  Airway & Oxygen Therapy: Patient Spontanous Breathing and Patient connected to face mask oxygen  Post-op Assessment: Report given to RN and Post -op Vital signs reviewed and stable  Post vital signs: Reviewed and stable  Last Vitals:  Vitals Value Taken Time  BP 116/58 03/23/2018 11:03 AM  Temp    Pulse 58 03/23/2018 11:06 AM  Resp 19 03/23/2018 11:06 AM  SpO2 100 % 03/23/2018 11:06 AM  Vitals shown include unvalidated device data.  Last Pain:  Vitals:   03/23/18 0707  TempSrc:   PainSc: 0-No pain      Patients Stated Pain Goal: 4 (00/93/81 8299)  Complications: No apparent anesthesia complications

## 2018-03-23 NOTE — Anesthesia Procedure Notes (Signed)
Spinal  Patient location during procedure: OR Start time: 03/23/2018 8:57 AM End time: 03/23/2018 9:02 AM Staffing Anesthesiologist: Hodierne, Adam, MD Resident/CRNA: Stubblefield, Howard G, CRNA Performed: resident/CRNA  Preanesthetic Checklist Completed: patient identified, site marked, surgical consent, pre-op evaluation, timeout performed, IV checked, risks and benefits discussed and monitors and equipment checked Spinal Block Patient position: sitting Prep: DuraPrep Patient monitoring: heart rate, continuous pulse ox and blood pressure Approach: midline Location: L2-3 Injection technique: single-shot Needle Needle type: Pencan  Needle gauge: 24 G Needle length: 9 cm Needle insertion depth: 9 cm Assessment Sensory level: T6 Additional Notes Kit expiration date checked and verified.  Sterile prep and drape, skin local with 1% xylocaine, - paraesthesia, - heme, +CSF. Patient tolerated well.     

## 2018-03-23 NOTE — Care Management (Signed)
Per H&P and pt conversation dc plan is for home with home health PT. Pt offered choice and [facility] chosen. [facility] rep alerted of referral. Pt states he has a rolling walker and 3in1 at home. Marney Doctor RN,BSN [phone]

## 2018-03-23 NOTE — Brief Op Note (Signed)
03/23/2018  10:32 AM  PATIENT:  Manuel Hunter  77 y.o. male  PRE-OPERATIVE DIAGNOSIS:  Osteoarthritis Right Knee  POST-OPERATIVE DIAGNOSIS:  Osteoarthritis Right Knee  PROCEDURE:  Procedure(s): RIGHT TOTAL KNEE ARTHROPLASTY (Right)  SURGEON:  Surgeon(s) and Role:    [name], MD - Primary  PHYSICIAN ASSISTANT: Benita Stabile, PA-C  ANESTHESIA:   regional and spinal  COUNTS:  YES  TOURNIQUET:   Total Tourniquet Time Documented: Thigh (Right) - 55 minutes Total: Thigh (Right) - 55 minutes   DICTATION: .Other Dictation: Dictation Number [id number]  PLAN OF CARE: Admit to inpatient   PATIENT DISPOSITION:  PACU - hemodynamically stable.   Delay start of Pharmacological VTE agent (>24hrs) due to surgical blood loss or risk of bleeding: no

## 2018-03-23 NOTE — Anesthesia Procedure Notes (Addendum)
Anesthesia Regional Block: Adductor canal block   Pre-Anesthetic Checklist: ,, timeout performed, Correct Patient, Correct Site, Correct Laterality, Correct Procedure, Correct Position, site marked, Risks and benefits discussed,  Surgical consent,  Pre-op evaluation,  At surgeon's request and post-op pain management  Laterality: Right  Prep: chloraprep       Needles:  Injection technique: Single-shot  Needle Type: Echogenic Needle     Needle Length: 9cm  Needle Gauge: 21     Additional Needles:   Narrative:  Start time: 03/23/2018 8:12 AM End time: 03/23/2018 8:20 AM Injection made incrementally with aspirations every 5 mL.  Performed by: Personally  Anesthesiologist: Albertha Ghee, MD  Additional Notes: Pt tolerated the procedure well.

## 2018-03-24 LAB — CBC
HEMATOCRIT: 34.7 % — AB (ref 39.0–52.0)
Hemoglobin: 12 g/dL — ABNORMAL LOW (ref 13.0–17.0)
MCH: 32.2 pg (ref 26.0–34.0)
MCHC: 34.6 g/dL (ref 30.0–36.0)
MCV: 93 fL (ref 78.0–100.0)
Platelets: 225 10*3/uL (ref 150–400)
RBC: 3.73 MIL/uL — AB (ref 4.22–5.81)
RDW: 13.4 % (ref 11.5–15.5)
WBC: 12.7 10*3/uL — ABNORMAL HIGH (ref 4.0–10.5)

## 2018-03-24 LAB — BASIC METABOLIC PANEL
Anion gap: 9 (ref 5–15)
BUN: 16 mg/dL (ref 8–23)
CO2: 27 mmol/L (ref 22–32)
Calcium: 8.3 mg/dL — ABNORMAL LOW (ref 8.9–10.3)
Chloride: 100 mmol/L (ref 98–111)
Creatinine, Ser: 1.07 mg/dL (ref 0.61–1.24)
GFR calc Af Amer: >60 mL/min (ref >60)
GFR calc non Af Amer: >60 mL/min (ref >60)
Glucose, Bld: 203 mg/dL — ABNORMAL HIGH (ref 70–99)
POTASSIUM: 4.3 mmol/L (ref 3.5–5.1)
Sodium: 136 mmol/L (ref 135–145)

## 2018-03-24 MED ORDER — HYDROMORPHONE HCL 2 MG PO TABS: 2.0000 mg | ORAL_TABLET | ORAL | 0 refills | Status: DC | PRN

## 2018-03-24 MED ORDER — METHOCARBAMOL 500 MG PO TABS: 500.0000 mg | ORAL_TABLET | Freq: Four times a day (QID) | ORAL | 0 refills | Status: DC | PRN

## 2018-03-24 MED ORDER — ASPIRIN 325 MG PO TBEC: 325.0000 mg | DELAYED_RELEASE_TABLET | Freq: Two times a day (BID) | ORAL | 0 refills | Status: DC

## 2018-03-24 MED ORDER — HYDROMORPHONE HCL 2 MG PO TABS
2.0000 mg | ORAL_TABLET | ORAL | Status: DC | PRN
  Administered 2018-03-24 – 2018-03-25 (×5): 2 mg via ORAL
  Filled 2018-03-24 (×6): qty 1

## 2018-03-24 NOTE — Evaluation (Signed)
Occupational Therapy Evaluation Patient Details Name: Manuel Hunter MRN: [id number] DOB: [birth year] Today's Date: 03/24/2018    History of Present Illness 77 YO male s/p R TKR on 03/23/18. PMH includes OA, headaches, GERD, hiatial hernia, LE edema, prostate cancer and surgery 2007, RLS, R RTC tear, sleep apnea. Past surgical history includes L finger arthroplasty 2018, knee surgery 2009, cervical surgery, 2 RTC repairs bilat, L THA 2018.   Clinical Impression   Patient is s/p R TKR surgery resulting in functional limitations due to the deficits listed below (see OT problem list). Pt requires MIn (A) to complete basic transfer from elevated surface. Pt was able to void bladder this session in static standing.  Patient will benefit from skilled OT acutely to increase independence and safety with ADLS to allow discharge [place].     Follow Up Recommendations  Home health OT    Equipment Recommendations  Other (comment);3 in 1 bedside commode(RW)    Recommendations for Other Services PT consult     Precautions / Restrictions Precautions Precautions: Fall Required Braces or Orthoses: Knee Immobilizer - Right Knee Immobilizer - Right: Discontinue once straight leg raise with < 10 degree lag Restrictions Weight Bearing Restrictions: No Other Position/Activity Restrictions: WBAT       Mobility Bed Mobility Overal bed mobility: Needs Assistance Bed Mobility: Supine to Sit     Supine to sit: Min assist;HOB elevated     General bed mobility comments: HOB must be elevated due to occipital injury and using a gait belt to help as leg lifter  Transfers Overall transfer level: Needs assistance Equipment used: Rolling walker (2 wheeled) Transfers: Sit to/from Stand Sit to Stand: Min assist;From elevated surface         General transfer comment: pt requires elevated surface for sit<S.tand this session due to BIL UE weakness and R UE discomfort . pt states "we have to do somethign  different" pt able to maintain correct hand placement with elevation    Balance Overall balance assessment: Needs assistance Sitting-balance support: Feet supported;No upper extremity supported       Standing balance support: Bilateral upper extremity supported;During functional activity Standing balance-Leahy Scale: Fair Standing balance comment: relies on RW                           ADL either performed or assessed with clinical judgement   ADL Overall ADL's : Needs assistance/impaired Eating/Feeding: Independent   Grooming: Oral care;Supervision/safety;Standing Grooming Details (indicate cue type and reason): sink level Upper Body Bathing: Supervision/ safety;Sitting   Lower Body Bathing: Minimal assistance;Sit to/from stand       Lower Body Dressing: Minimal assistance;Sit to/from stand   Toilet Transfer: Minimal assistance;Regular Toilet(static standing for void of bladder) Toilet Transfer Details (indicate cue type and reason): static standing to void         Functional mobility during ADLs: Minimal assistance;Rolling walker General ADL Comments: pt with R UE deficits which requires (A) for sit<>stand recommending elevated surfaces to help with transfers     Vision Baseline Vision/History: Wears glasses Wears Glasses: Reading only       Perception     Praxis      Pertinent Vitals/Pain Pain Assessment: 0-10 Pain Score: 5  Pain Location: R knee  Pain Descriptors / Indicators: Sore;Operative site guarding     Hand Dominance Right   Extremity/Trunk Assessment Upper Extremity Assessment Upper Extremity Assessment: RUE deficits/detail RUE Deficits / Details: x2 R surgeries with  need to have rotator repair   Lower Extremity Assessment Lower Extremity Assessment: Defer to PT evaluation   Cervical / Trunk Assessment Cervical / Trunk Assessment: Normal   Communication Communication Communication: No difficulties   Cognition  Arousal/Alertness: Awake/alert Behavior During Therapy: WFL for tasks assessed/performed Overall Cognitive Status: Within Functional Limits for tasks assessed                                     General Comments  dressing dry and intact. Ice applied at end of session. Ted hose on bil LE    Exercises     Shoulder Instructions      Home Living Family/patient expects to be discharged to:: Private residence Living Arrangements: Spouse/significant other Available Help at Discharge: Family;Available PRN/intermittently Type of Home: House Home Access: Stairs to enter [employer] of Steps: 2 Entrance Stairs-Rails: Can reach both;Left;Right Home Layout: One level     Bathroom Shower/Tub: Occupational psychologist: Standard     Home Equipment: Environmental consultant - 2 wheels;Cane - single point;Bedside commode          Prior Functioning/Environment Level of Independence: Independent        Comments: not using ADs prior to admission         OT Problem List: Decreased strength;Decreased activity tolerance;Impaired balance (sitting and/or standing);Decreased range of motion;Decreased safety awareness;Decreased knowledge of precautions;Decreased knowledge of use of DME or AE;Obesity;Impaired UE functional use;Pain      OT Treatment/Interventions: Self-care/ADL training;Therapeutic exercise;Energy conservation;DME and/or AE instruction;Modalities;Manual therapy;Therapeutic activities;Patient/family education;Balance training    OT Goals(Current goals can be found in the care plan section) Acute Rehab OT Goals Patient Stated Goal: to go home sunday OT Goal Formulation: With patient Time For Goal Achievement: 04/07/18 Potential to Achieve Goals: Good  OT Frequency: Min 2X/week   Barriers to D/C:            Co-evaluation              AM-PAC PT "6 Clicks" Daily Activity     Outcome Measure Help from another person eating meals?: None Help from  another person taking care of personal grooming?: None Help from another person toileting, which includes using toliet, bedpan, or urinal?: A Little Help from another person bathing (including washing, rinsing, drying)?: A Little Help from another person to put on and taking off regular upper body clothing?: A Little Help from another person to put on and taking off regular lower body clothing?: A Lot 6 Click Score: 19   End of Session Equipment Utilized During Treatment: Gait belt;Rolling walker;Right knee immobilizer CPM Right Knee CPM Right Knee: Off Nurse Communication: Mobility status;Precautions;Weight bearing status  Activity Tolerance: Patient tolerated treatment well Patient left: in chair;with call bell/phone within reach;with nursing/sitter in room  OT Visit Diagnosis: Unsteadiness on feet (R26.81);Muscle weakness (generalized) (M62.81);Pain Pain - Right/Left: Right Pain - part of body: Leg                Time: 0930-1005 OT Time Calculation (min): 35 min Charges:  OT General Charges $OT Visit: 1 Visit OT Evaluation $OT Eval Moderate Complexity: 1 Mod OT Treatments $Self Care/Home Management : 8-22 mins   Jeri Modena, OTR/L  Acute Rehabilitation Services Pager: [phone] Office: [phone] .   Parke Poisson B 03/24/2018, 10:19 AM

## 2018-03-24 NOTE — Progress Notes (Signed)
Physical Therapy Treatment Patient Details Name: Manuel Hunter MRN: [id number] DOB: 06/25/1941 Today's Date: 03/24/2018    History of Present Illness 77 YO male s/p R TKR on 03/23/18. PMH includes OA, headaches, GERD, hiatial hernia, LE edema, prostate cancer and surgery 2007, RLS, R RTC tear, sleep apnea. Past surgical history includes L finger arthroplasty 2018, knee surgery 2009, cervical surgery, 2 RTC repairs bilat, L THA 2018.    PT Comments    Pt progressing toward PT goals; continue POC   Follow Up Recommendations  Follow surgeon's recommendation for DC plan and follow-up therapies;Supervision for mobility/OOB     Equipment Recommendations  None recommended by PT    Recommendations for Other Services       Precautions / Restrictions Precautions Precautions: Fall;Knee Required Braces or Orthoses: Knee Immobilizer - Right Knee Immobilizer - Right: Discontinue once straight leg raise with < 10 degree lag Restrictions Weight Bearing Restrictions: No Other Position/Activity Restrictions: WBAT     Mobility  Bed Mobility Overal bed mobility: Needs Assistance Bed Mobility: Supine to Sit     Supine to sit: Min assist;HOB elevated     General bed mobility comments: in chair  Transfers Overall transfer level: Needs assistance Equipment used: Rolling walker (2 wheeled) Transfers: Sit to/from Stand Sit to Stand: Min guard         General transfer comment: cues for safety and  hand placement, pt stands without  RW  Ambulation/Gait Ambulation/Gait assistance: Min Gaffer (Feet): 200 Feet(also amb with OT) Assistive device: Rolling walker (2 wheeled) Gait Pattern/deviations: Step-to pattern;Step-through pattern;Decreased weight shift to right     General Gait Details: Min guard for safety. Verbal cuing for sequencing and stepping into RW.    Stairs             Wheelchair Mobility    Modified Rankin (Stroke Patients Only)        Balance Overall balance assessment: Needs assistance Sitting-balance support: Feet supported;No upper extremity supported       Standing balance support: Bilateral upper extremity supported;During functional activity Standing balance-Leahy Scale: Fair Standing balance comment: relies on RW                            Cognition Arousal/Alertness: Awake/alert Behavior During Therapy: WFL for tasks assessed/performed Overall Cognitive Status: Within Functional Limits for tasks assessed                                        Exercises Total Joint Exercises Ankle Circles/Pumps: AROM;Both;10 reps;Supine Quad Sets: AROM;Right;10 reps;Supine Heel Slides: AAROM;Right;10 reps Hip ABduction/ADduction: AROM;Right;10 reps Straight Leg Raises: AAROM;Right;10 reps Goniometric ROM: grossly -8* to 65* AAROM right knee    General Comments General comments (skin integrity, edema, etc.): dressing dry and intact. Ice applied at end of session. Ted hose on bil LE      Pertinent Vitals/Pain Pain Assessment: 0-10 Pain Score: 3  Pain Location: R knee  Pain Descriptors / Indicators: Sore;Operative site guarding Pain Intervention(s): Limited activity within patient's tolerance;Monitored during session    [place] expects to be discharged to:: Private residence Living Arrangements: Spouse/significant other Available Help at Discharge: Family;Available PRN/intermittently Type of Home: House Home Access: Stairs to enter Entrance Stairs-Rails: Can reach both;Left;Right Home Layout: One level Home Equipment: Walker - 2 wheels;Cane - single point;Bedside commode  Prior Function Level of Independence: Independent      Comments: not using ADs prior to admission    PT Goals (current goals can now be found in the care plan section) Acute Rehab PT Goals Patient Stated Goal: to go home sunday PT Goal Formulation: With patient Time For Goal  Achievement: 04/06/18 Potential to Achieve Goals: Good Progress towards PT goals: Progressing toward goals    Frequency    7X/week      PT Plan Current plan remains appropriate    Co-evaluation              AM-PAC PT "6 Clicks" Daily Activity  Outcome Measure  Difficulty turning over in bed (including adjusting bedclothes, sheets and blankets)?: A Lot Difficulty moving from lying on back to sitting on the side of the bed? : A Lot Difficulty sitting down on and standing up from a chair with arms (e.g., wheelchair, bedside commode, etc,.)?: A Little Help needed moving to and from a bed to chair (including a wheelchair)?: A Little Help needed walking in hospital room?: A Little Help needed climbing 3-5 steps with a railing? : A Little 6 Click Score: 16    End of Session Equipment Utilized During Treatment: Gait belt Activity Tolerance: Patient tolerated treatment well;No increased pain Patient left: in chair;with call bell/phone within reach;with chair alarm set   PT Visit Diagnosis: Unsteadiness on feet (R26.81);Difficulty in walking, not elsewhere classified (R26.2)     Time: 3435-6861 PT Time Calculation (min) (ACUTE ONLY): 19 min  Charges:  $Gait Training: 8-22 mins                     Kenyon Ana, PT Pager: [phone] 03/24/2018   [facility]hab [facility] [phone]    [facility] 03/24/2018, 12:42 PM

## 2018-03-24 NOTE — Progress Notes (Signed)
Subjective: 1 Day Post-Op Procedure(s) (LRB): RIGHT TOTAL KNEE ARTHROPLASTY (Right) Patient reports pain as moderate.    Objective: Vital signs in last 24 hours: Temp:  [97.7 F (36.5 C)-99 F (37.2 C)] 98.4 F (36.9 C) (09/07 1039) Pulse Rate:  [53-72] 72 (09/07 1039) Resp:  [12-20] 17 (09/07 1039) BP: (109-167)/(61-97) 122/97 (09/07 1039) SpO2:  [95 %-100 %] 95 % (09/07 1039)  Intake/Output from previous day: 09/06 0701 - 09/07 0700 In: 2798.3 [P.O.:460; I.V.:2088.4; IV Piggyback:249.9] Out: 1725 [Urine:1700; Blood:25] Intake/Output this shift: Total I/O In: 420 [P.O.:420] Out: 250 [Urine:250]  Recent Labs    03/24/18 0346  HGB 12.0*   Recent Labs    03/24/18 0346  WBC 12.7*  RBC 3.73*  HCT 34.7*  PLT 225   Recent Labs    03/24/18 0346  NA 136  K 4.3  CL 100  CO2 27  BUN 16  CREATININE 1.07  GLUCOSE 203*  CALCIUM 8.3*   No results for input(s): LABPT, INR in the last 72 hours.  Sensation intact distally Intact pulses distally Dorsiflexion/Plantar flexion intact Incision: dressing C/D/I No cellulitis present Compartment soft   Patient's anticipated LOS is less than 2 midnights, meeting these requirements: - Younger than 67 - Lives within 1 hour of care - Has a competent adult at home to recover with post-op recover - NO history of  - Chronic pain requiring opiods  - Diabetes  - Coronary Artery Disease  - Heart failure  - Heart attack  - Stroke  - DVT/VTE  - Cardiac arrhythmia  - Respiratory Failure/COPD  - Renal failure  - Anemia  - Advanced Liver disease      Assessment/Plan: 1 Day Post-Op Procedure(s) (LRB): RIGHT TOTAL KNEE ARTHROPLASTY (Right) Up with therapy Plan for discharge tomorrow Discharge home with home health    [name] 03/24/2018, 11:56 AM

## 2018-03-24 NOTE — Discharge Instructions (Signed)

## 2018-03-24 NOTE — Progress Notes (Signed)
Pt progressing toward goals, remains mildly impulsive and have cautioned pt to move more slowly for safety; wife present for session today  03/24/18 1400  PT Visit Information  Assistance Needed +1  History of Present Illness 77 YO male s/p R TKR on 03/23/18. PMH includes OA, headaches, GERD, hiatial hernia, LE edema, prostate cancer and surgery 2007, RLS, R RTC tear, sleep apnea. Past surgical history includes L finger arthroplasty 2018, knee surgery 2009, cervical surgery, 2 RTC repairs bilat, L THA 2018.  Subjective Data  Patient Stated Goal to go home sunday  Precautions  Precautions Fall;Knee  Required Braces or Orthoses Knee Immobilizer - Right  Knee Immobilizer - Right Discontinue once straight leg raise with < 10 degree lag  Restrictions  Weight Bearing Restrictions No  Other Position/Activity Restrictions WBAT   Pain Assessment  Pain Assessment 0-10  Pain Score 3  Pain Location R knee   Pain Descriptors / Indicators Sore;Operative site guarding  Pain Intervention(s) Limited activity within patient's tolerance;Monitored during session;Premedicated before session;Ice applied  Cognition  Arousal/Alertness Awake/alert  Behavior During Therapy WFL for tasks assessed/performed  Overall Cognitive Status Within Functional Limits for tasks assessed  Bed Mobility  Overal bed mobility Needs Assistance  Bed Mobility Supine to Sit;Sit to Supine  Supine to sit Min assist  Sit to supine Min assist  General bed mobility comments assist with RLE   Transfers  Overall transfer level Needs assistance  Equipment used Rolling walker (2 wheeled)  Transfers Sit to/from Stand  Sit to Stand Min guard;Min assist  General transfer comment cues for safety and  hand placement, pt stands without  RW  Ambulation/Gait  Ambulation/Gait assistance Min guard;Supervision  Gait Distance (Feet) 105 Feet  Assistive device Rolling walker (2 wheeled)  Gait Pattern/deviations Step-to pattern;Step-through  pattern;Decreased weight shift to right  General Gait Details Min guard for safety. Verbal cuing for sequencing, heel strike on R, RW position  Balance  Standing balance-Leahy Scale Fair  Total Joint Exercises  Ankle Circles/Pumps AROM;Both;10 reps;Supine  PT - End of Session  Equipment Utilized During Treatment Gait belt  Activity Tolerance Patient tolerated treatment well;No increased pain  Patient left with call bell/phone within reach;in bed;with bed alarm set;with family/visitor present   PT - Assessment/Plan  PT Plan Current plan remains appropriate  PT Visit Diagnosis Unsteadiness on feet (R26.81);Difficulty in walking, not elsewhere classified (R26.2)  PT Frequency (ACUTE ONLY) 7X/week  Follow Up Recommendations Follow surgeon's recommendation for DC plan and follow-up therapies;Supervision for mobility/OOB  PT equipment None recommended by PT  AM-PAC PT "6 Clicks" Daily Activity Outcome Measure  Difficulty turning over in bed (including adjusting bedclothes, sheets and blankets)? 2  Difficulty moving from lying on back to sitting on the side of the bed?  2  Difficulty sitting down on and standing up from a chair with arms (e.g., wheelchair, bedside commode, etc,.)? 3  Help needed moving to and from a bed to chair (including a wheelchair)? 3  Help needed walking in hospital room? 3  Help needed climbing 3-5 steps with a railing?  3  6 Click Score 16  Mobility G Code  CK  PT Goal Progression  Progress towards PT goals Progressing toward goals  Acute Rehab PT Goals  PT Goal Formulation With patient  Time For Goal Achievement 04/06/18  Potential to Achieve Goals Good  PT Time Calculation  PT Start Time (ACUTE ONLY) 1346  PT Stop Time (ACUTE ONLY) 1412  PT Time Calculation (min) (ACUTE ONLY) 26  min  PT General Charges  $$ ACUTE PT VISIT 1 Visit  PT Treatments  $Gait Training 8-22 mins  $Therapeutic Activity 8-22 mins

## 2018-03-25 NOTE — Progress Notes (Signed)
Occupational Therapy Treatment Patient Details Name: Manuel Hunter MRN: [id number] DOB: 1940/12/20 Today's Date: 03/25/2018    History of present illness 77 YO male s/p R TKR on 03/23/18. PMH includes OA, headaches, GERD, hiatial hernia, LE edema, prostate cancer and surgery 2007, RLS, R RTC tear, sleep apnea. Past surgical history includes L finger arthroplasty 2018, knee surgery 2009, cervical surgery, 2 RTC repairs bilat, L THA 2018.   OT comments  Pt progressing towards OT goals. Reviewed AE and compensatory strategies for completing LB ADLs during this session. Pt also performing simulated shower transfer in room, requiring minguard-minA and min verbal cues for safe completion using RW. Pt completing room level mobility with overall minguard assist using RW; does require min cues for safety as pt somewhat impulsive with initial transfers and mobility. Spouse present and also engaged during session. Feel POC remains appropriate at this time. Will continue to follow while pt remains in acute setting.    Follow Up Recommendations  Home health OT    Equipment Recommendations  3 in 1 bedside commode          Precautions / Restrictions Precautions Precautions: Fall;Knee Required Braces or Orthoses: Knee Immobilizer - Right Knee Immobilizer - Right: Discontinue once straight leg raise with < 10 degree lag Restrictions Weight Bearing Restrictions: No Other Position/Activity Restrictions: WBAT        Mobility Bed Mobility               General bed mobility comments: OOB in recliner upon arrival  Transfers Overall transfer level: Needs assistance Equipment used: Rolling walker (2 wheeled) Transfers: Sit to/from Stand Sit to Stand: Min guard         General transfer comment: cues for safety and  hand placement, pt stands without  RW                                               ADL either performed or assessed with clinical judgement   ADL Overall  ADL's : Needs assistance/impaired                     Lower Body Dressing: Minimal assistance;Sit to/from stand Lower Body Dressing Details (indicate cue type and reason): educated pt regarding AE for completing LB ADLs including reacher and sock aide; pt verbalizing understanding but reports spouse will most likely assist with LB dressing initially         Tub/ Shower Transfer: Walk-in shower;Minimal assistance;Cueing for safety;Ambulation;Rolling walker Tub/Shower Transfer Details (indicate cue type and reason): educated on use of 3:1 as shower seat (vs shower seat); requires initial cues for safety and to recall sequence of transfer, pt is then able to recall which LE to step in with Functional mobility during ADLs: Minimal assistance;Rolling walker General ADL Comments: pt's spouse also present during education with both pt/pt's spouse verbalizing understanding                       Cognition Arousal/Alertness: Awake/alert Behavior During Therapy: WFL for tasks assessed/performed Overall Cognitive Status: Within Functional Limits for tasks assessed                                 General Comments: slightly impulsive with mobility requiring safety cues  General Comments      Pertinent Vitals/ Pain       Pain Assessment: Faces Faces Pain Scale: Hurts little more Pain Location: R knee  Pain Descriptors / Indicators: Sore;Operative site guarding Pain Intervention(s): Monitored during session;Repositioned;Ice applied  Home Living                                          Prior Functioning/Environment              Frequency  Min 2X/week        Progress Toward Goals  OT Goals(current goals can now be found in the care plan section)     Acute Rehab OT Goals Patient Stated Goal: home today OT Goal Formulation: With patient Time For Goal Achievement: 04/07/18 Potential to Achieve Goals: Good   Plan Discharge plan remains appropriate    Co-evaluation                 AM-PAC PT "6 Clicks" Daily Activity     Outcome Measure   Help from another person eating meals?: None Help from another person taking care of personal grooming?: None Help from another person toileting, which includes using toliet, bedpan, or urinal?: A Little Help from another person bathing (including washing, rinsing, drying)?: A Little Help from another person to put on and taking off regular upper body clothing?: A Little Help from another person to put on and taking off regular lower body clothing?: A Lot 6 Click Score: 19    End of Session Equipment Utilized During Treatment: Gait belt;Rolling walker;Right knee immobilizer CPM Right Knee CPM Right Knee: Off  OT Visit Diagnosis: Unsteadiness on feet (R26.81);Muscle weakness (generalized) (M62.81);Pain Pain - Right/Left: Right Pain - part of body: Leg   Activity Tolerance Patient tolerated treatment well   Patient Left in chair;with call bell/phone within reach;with family/visitor present   Nurse Communication Mobility status        Time: 8850-2774 OT Time Calculation (min): 23 min  Charges: OT General Charges $OT Visit: 1 Visit OT Treatments $Self Care/Home Management : 8-22 mins  Manuel Hunter, OT [facility] Pager [phone] Office [phone]     Manuel Hunter 03/25/2018, 12:12 PM

## 2018-03-25 NOTE — Progress Notes (Signed)
Physical Therapy Treatment Patient Details Name: Manuel Hunter MRN: [id number] DOB: 11-17-1940 Today's Date: 03/25/2018    History of Present Illness 77 YO male s/p R TKR on 03/23/18. PMH includes OA, headaches, GERD, hiatial hernia, LE edema, prostate cancer and surgery 2007, RLS, R RTC tear, sleep apnea. Past surgical history includes L finger arthroplasty 2018, knee surgery 2009, cervical surgery, 2 RTC repairs bilat, L THA 2018.    PT Comments    Pt progressing, knee flexion and ROM more limited today d/t pain; encouraged continued exercises and ROM at home; pt feels ready to go home today from moblity standpoint.  Follow Up Recommendations  Follow surgeon's recommendation for DC plan and follow-up therapies;Supervision for mobility/OOB     Equipment Recommendations  None recommended by PT    Recommendations for Other Services       Precautions / Restrictions Precautions Precautions: Fall;Knee Required Braces or Orthoses: Knee Immobilizer - Right Knee Immobilizer - Right: Discontinue once straight leg raise with < 10 degree lag Restrictions Weight Bearing Restrictions: No Other Position/Activity Restrictions: WBAT     Mobility  Bed Mobility Overal bed mobility: Needs Assistance Bed Mobility: Supine to Sit     Supine to sit: Min assist     General bed mobility comments: assist with RLE   Transfers Overall transfer level: Needs assistance Equipment used: Rolling walker (2 wheeled) Transfers: Sit to/from Stand Sit to Stand: Min guard;Supervision         General transfer comment: cues for safety and  hand placement, pt stands without  RW  Ambulation/Gait Ambulation/Gait assistance: Supervision Gait Distance (Feet): 200 Feet Assistive device: Rolling walker (2 wheeled) Gait Pattern/deviations: Step-to pattern;Step-through pattern;Decreased weight shift to right     General Gait Details: Min guard for safety. Verbal cuing for sequencing, heel strike on R, RW  position   Stairs Stairs: Yes Stairs assistance: Min assist Stair Management: No rails;Step to pattern;Backwards;With walker Number of Stairs: 2 General stair comments: cues for technique and sequence   Wheelchair Mobility    Modified Rankin (Stroke Patients Only)       Balance             Standing balance-Leahy Scale: Fair                              Cognition Arousal/Alertness: Awake/alert Behavior During Therapy: WFL for tasks assessed/performed Overall Cognitive Status: Within Functional Limits for tasks assessed                                 General Comments: slightly impulsive with mobility requiring safety cues      Exercises Total Joint Exercises Ankle Circles/Pumps: AROM;Both;10 reps;Supine Quad Sets: AROM;Right;10 reps Heel Slides: Limitations Heel Slides Limitations: pain and guarding    General Comments        Pertinent Vitals/Pain Pain Assessment: 0-10 Pain Score: 3  Faces Pain Scale: Hurts little more Pain Location: R knee  Pain Descriptors / Indicators: Sore;Operative site guarding Pain Intervention(s): Limited activity within patient's tolerance;Monitored during session;Premedicated before session;Repositioned;Ice applied    Home Living                      Prior Function            PT Goals (current goals can now be found in the care plan section) Acute Rehab PT Goals  Patient Stated Goal: to go home sunday PT Goal Formulation: With patient Time For Goal Achievement: 04/06/18 Potential to Achieve Goals: Good Progress towards PT goals: Progressing toward goals    Frequency    7X/week      PT Plan Current plan remains appropriate    Co-evaluation              AM-PAC PT "6 Clicks" Daily Activity  Outcome Measure  Difficulty turning over in bed (including adjusting bedclothes, sheets and blankets)?: A Lot Difficulty moving from lying on back to sitting on the side of the bed?  : A Lot Difficulty sitting down on and standing up from a chair with arms (e.g., wheelchair, bedside commode, etc,.)?: A Little Help needed moving to and from a bed to chair (including a wheelchair)?: A Little Help needed walking in hospital room?: A Little Help needed climbing 3-5 steps with a railing? : A Little 6 Click Score: 16    End of Session Equipment Utilized During Treatment: Gait belt Activity Tolerance: Patient tolerated treatment well;No increased pain Patient left: with call bell/phone within reach;in bed;with bed alarm set;with family/visitor present   PT Visit Diagnosis: Unsteadiness on feet (R26.81);Difficulty in walking, not elsewhere classified (R26.2)     Time: 0623-7628 PT Time Calculation (min) (ACUTE ONLY): 21 min  Charges:  $Gait Training: 8-22 mins                     Kenyon Ana, PT Pager: [phone] 03/25/2018   [facility] [phone]    [facility] 03/25/2018, 12:58 PM

## 2018-03-25 NOTE — Progress Notes (Signed)
   Subjective: 2 Days Post-Op Procedure(s) (LRB): RIGHT TOTAL KNEE ARTHROPLASTY (Right) Patient reports pain as moderate.    Objective: Vital signs in last 24 hours: Temp:  [97.7 F (36.5 C)-98.7 F (37.1 C)] 97.7 F (36.5 C) (09/08 0550) Pulse Rate:  [65-72] 67 (09/08 0550) Resp:  [13-18] 16 (09/08 0550) BP: (122-158)/(66-97) 140/71 (09/08 0550) SpO2:  [90 %-100 %] 100 % (09/08 0550)  Intake/Output from previous day: 09/07 0701 - 09/08 0700 In: 2031.2 [P.O.:1320; I.V.:711.2] Out: 1250 [Urine:1250] Intake/Output this shift: No intake/output data recorded.  Recent Labs    03/24/18 0346  HGB 12.0*   Recent Labs    03/24/18 0346  WBC 12.7*  RBC 3.73*  HCT 34.7*  PLT 225   Recent Labs    03/24/18 0346  NA 136  K 4.3  CL 100  CO2 27  BUN 16  CREATININE 1.07  GLUCOSE 203*  CALCIUM 8.3*   No results for input(s): LABPT, INR in the last 72 hours.  Neurologically intact No results found.  Assessment/Plan: 2 Days Post-Op Procedure(s) (LRB): RIGHT TOTAL KNEE ARTHROPLASTY (Right) Up with therapy  Marybelle Killings 03/25/2018, 9:26 AM

## 2018-03-26 ENCOUNTER — Encounter (HOSPITAL_COMMUNITY): Payer: Self-pay | Admitting: Orthopaedic Surgery

## 2018-03-27 NOTE — Discharge Summary (Signed)
Patient ID: Manuel Hunter MRN: [id number] DOB/AGE: [birth year] 77 y.o.  Admit date: 03/23/2018 Discharge date: 03/27/2018  Admission Diagnoses:  Principal Problem:   Unilateral primary osteoarthritis, right knee Active Problems:   Status post total knee replacement, right   Discharge Diagnoses:  Same  Past Medical History:  Diagnosis Date  . Arthritis   . Complication of anesthesia    States "I woke up to soon one time" with rotator cuff repair  . GERD (gastroesophageal reflux disease)   . Headache   . History of hiatal hernia   . Lower extremity edema    occasionally takes furosemide prn  . Prostate cancer ([place]) 2007   tx with surgery  . RLS (restless legs syndrome)   . Rotator cuff tear    right  . Sleep apnea    uses CPAP nightly    Surgeries: Procedure(s): RIGHT TOTAL KNEE ARTHROPLASTY on 03/23/2018   Consultants:   Discharged Condition: Improved  Hospital Course: Manuel Hunter is an 77 y.o. male who was admitted 03/23/2018 for operative treatment ofUnilateral primary osteoarthritis, right knee. Patient has severe unremitting pain that affects sleep, daily activities, and work/hobbies. After pre-op clearance the patient was taken to the operating room on 03/23/2018 and underwent  Procedure(s): RIGHT TOTAL KNEE ARTHROPLASTY.    Patient was given perioperative antibiotics:  Anti-infectives (From admission, onward)   Start     Dose/Rate Route Frequency Ordered Stop   03/23/18 1500  ceFAZolin (ANCEF) IVPB 1 g/50 mL premix     1 g 100 mL/hr over 30 Minutes Intravenous Every 6 hours 03/23/18 1244 03/23/18 2203   03/23/18 0645  ceFAZolin (ANCEF) IVPB 2g/100 mL premix     2 g 200 mL/hr over 30 Minutes Intravenous On call to O.R. 03/23/18 7829 03/23/18 0933       Patient was given sequential compression devices, early ambulation, and chemoprophylaxis to prevent DVT.  Patient benefited maximally from hospital stay and there were no complications.    Recent vital  signs: No data found.   Recent laboratory studies: No results for input(s): WBC, HGB, HCT, PLT, NA, K, CL, CO2, BUN, CREATININE, GLUCOSE, INR, CALCIUM in the last 72 hours.  Invalid input(s): PT, 2   Discharge Medications:   Allergies as of 03/25/2018      Reactions   Morphine Nausea And Vomiting      Medication List    TAKE these medications   amoxicillin 500 MG tablet Commonly known as:  AMOXIL Take 2,000 mg by mouth See admin instructions. 2000 mg before dental procedures   aspirin 325 MG EC tablet Take 1 tablet (325 mg total) by mouth 2 (two) times daily.   furosemide 20 MG tablet Commonly known as:  LASIX Take 20 mg by mouth daily as needed for edema.   HYDROmorphone 2 MG tablet Commonly known as:  DILAUDID Take 1 tablet (2 mg total) by mouth every 4 (four) hours as needed for severe pain.   methocarbamol 500 MG tablet Commonly known as:  ROBAXIN Take 1 tablet (500 mg total) by mouth every 6 (six) hours as needed for muscle spasms.   pantoprazole 40 MG tablet Commonly known as:  PROTONIX Take 40 mg by mouth every morning.   phenytoin 100 MG ER capsule Commonly known as:  DILANTIN Take 100-200 mg by mouth See admin instructions. 100 mg in the morning, 200 mg at bedtime   rOPINIRole 1 MG tablet Commonly known as:  REQUIP Take 1 mg by mouth 3 (three)  times daily with meals.   rOPINIRole 3 MG tablet Commonly known as:  REQUIP Take 3 mg by mouth at bedtime.   zolpidem 10 MG tablet Commonly known as:  AMBIEN Take 10 mg by mouth at bedtime as needed for sleep.       Diagnostic Studies: Dg Knee Right Port  Result Date: 03/23/2018 CLINICAL DATA:  Postop right knee replacement. EXAM: PORTABLE RIGHT KNEE - 1-2 VIEW COMPARISON:  None. FINDINGS: New right knee prosthetic components appear well seated and well aligned. There is no acute fracture or evidence of an operative complication. IMPRESSION: Well-positioned total right knee arthroplasty. Electronically Signed    By: Lajean Manes M.D.   On: 03/23/2018 11:29    Disposition:     Follow-up Information    [place] Follow up.   Specialty:  Home Health Services Contact information: [place] [address] [phone]        [name], MD Follow up in 2 week(s).   Specialty:  Orthopedic Surgery Contact information: [place] [address] [phone]            Signed: Erskine Emery 03/27/2018, 11:21 AM

## 2018-03-29 ENCOUNTER — Telehealth (INDEPENDENT_AMBULATORY_CARE_PROVIDER_SITE_OTHER): Payer: Self-pay | Admitting: Orthopaedic Surgery

## 2018-03-29 NOTE — Telephone Encounter (Signed)
Trish at [facility] needs verbal orders for HHPT  3 x for 2 weeks  CB # [phone]

## 2018-03-29 NOTE — Telephone Encounter (Signed)
Verbal order given  

## 2018-04-05 ENCOUNTER — Encounter (INDEPENDENT_AMBULATORY_CARE_PROVIDER_SITE_OTHER): Payer: Self-pay | Admitting: Orthopaedic Surgery

## 2018-04-05 ENCOUNTER — Ambulatory Visit (INDEPENDENT_AMBULATORY_CARE_PROVIDER_SITE_OTHER): Payer: Medicare HMO | Admitting: Orthopaedic Surgery

## 2018-04-05 DIAGNOSIS — Z96651 Presence of right artificial knee joint: Secondary | ICD-10-CM

## 2018-04-05 MED ORDER — METHOCARBAMOL 500 MG PO TABS: 500.0000 mg | ORAL_TABLET | Freq: Four times a day (QID) | ORAL | 0 refills | Status: DC | PRN

## 2018-04-05 MED ORDER — NAPROXEN 500 MG PO TABS: 500.0000 mg | ORAL_TABLET | Freq: Two times a day (BID) | ORAL | 1 refills | Status: DC

## 2018-04-05 NOTE — Progress Notes (Signed)
The patient is following up status post a right total knee arthroplasty.  He is 2 weeks into this.  He has home therapy coming to his house.  He states that most flexion is gotten into his 90 degrees.  In the office today he lacks full extension by about 5 degrees and active flexion to only 80 degrees.  His calf is soft.  His incisions well-healed I placed new Steri-Strips.  I stressed the importance of getting his knee bending and moving.  I did refill his Dilaudid.  I sent in some muscle relaxant as well as naproxen.  He can stop his aspirin.  He will continue his TED hose due to peripheral edema.  We will transition to outpatient therapy as well to work on aggressive range of motion of his right knee.  When he is done with this prescription for Dilaudid will back and down the oxycodone.  All question concerns were answered and addressed.

## 2018-04-09 ENCOUNTER — Other Ambulatory Visit (INDEPENDENT_AMBULATORY_CARE_PROVIDER_SITE_OTHER): Payer: Self-pay | Admitting: Orthopaedic Surgery

## 2018-04-09 ENCOUNTER — Other Ambulatory Visit (INDEPENDENT_AMBULATORY_CARE_PROVIDER_SITE_OTHER): Payer: Self-pay | Admitting: Radiology

## 2018-04-09 DIAGNOSIS — Z96651 Presence of right artificial knee joint: Secondary | ICD-10-CM

## 2018-04-16 ENCOUNTER — Telehealth (INDEPENDENT_AMBULATORY_CARE_PROVIDER_SITE_OTHER): Payer: Self-pay | Admitting: Orthopaedic Surgery

## 2018-04-16 MED ORDER — HYDROMORPHONE HCL 2 MG PO TABS: 2.0000 mg | ORAL_TABLET | ORAL | 0 refills | Status: DC | PRN

## 2018-04-16 NOTE — Telephone Encounter (Signed)
Medication refill Hydromorphone (Dilaudid) 2mg  tablet   Please call patient when med is ready for pick up

## 2018-04-16 NOTE — Telephone Encounter (Signed)
Please advise 

## 2018-04-16 NOTE — Telephone Encounter (Signed)
I called patient advised sent.

## 2018-04-16 NOTE — Telephone Encounter (Signed)
I just sent some in. 

## 2018-04-23 ENCOUNTER — Ambulatory Visit (INDEPENDENT_AMBULATORY_CARE_PROVIDER_SITE_OTHER): Payer: Medicare HMO | Admitting: Physical Therapy

## 2018-04-23 ENCOUNTER — Encounter: Payer: Self-pay | Admitting: Physical Therapy

## 2018-04-23 DIAGNOSIS — M25561 Pain in right knee: Secondary | ICD-10-CM | POA: Diagnosis not present

## 2018-04-23 DIAGNOSIS — R2689 Other abnormalities of gait and mobility: Secondary | ICD-10-CM | POA: Diagnosis not present

## 2018-04-23 DIAGNOSIS — M25661 Stiffness of right knee, not elsewhere classified: Secondary | ICD-10-CM | POA: Diagnosis not present

## 2018-04-23 NOTE — Therapy (Signed)
[place], [place], [address] Phone: [phone]   Fax:  [phone]  Physical Therapy Evaluation  Patient Details  Name: Manuel Hunter MRN: [id number] Date of Birth: 1941/06/24 Referring Provider (PT): [name], MD   Encounter Date: 04/23/2018  PT End of Session - 04/23/18 0929    Visit Number  1    Number of Visits  16    Date for PT Re-Evaluation  06/18/18    Authorization Type  [employer]    PT Start Time  0845    PT Stop Time  0925    PT Time Calculation (min)  40 min    Activity Tolerance  Patient tolerated treatment well    Behavior During Therapy  [facility] for tasks assessed/performed       Past Medical History:  Diagnosis Date  . Arthritis   . Complication of anesthesia    States "I woke up to soon one time" with rotator cuff repair  . GERD (gastroesophageal reflux disease)   . Headache   . History of hiatal hernia   . Lower extremity edema    occasionally takes furosemide prn  . Prostate cancer ([place]) 2007   tx with surgery  . RLS (restless legs syndrome)   . Rotator cuff tear    right  . Sleep apnea    uses CPAP nightly    Past Surgical History:  Procedure Laterality Date  . FINGER ARTHROPLASTY Left 03/10/2017   Procedure: Left index finger metacarpal phalangeal arthroplasty;  Surgeon: Roseanne Kaufman, MD;  Location: [facility];  Service: Orthopedics;  Laterality: Left;  90 mins  . KNEE SURGERY Bilateral   . left knee replacment  2009  . MANDIBLE FRACTURE SURGERY     left 2 titanium plates and 10 scres  . NECK SURGERY     cervical with bone graft  . PROSTATE SURGERY     prostatctomy  . ROTATOR CUFF REPAIR     x 2 right x 2 left  . TOTAL HIP ARTHROPLASTY Left 09/13/2016   Procedure: LEFT TOTAL HIP ARTHROPLASTY ANTERIOR APPROACH;  Surgeon: [name], MD;  Location: [facility];  Service: Orthopedics;  Laterality: Left;  . TOTAL KNEE  ARTHROPLASTY Right 03/23/2018   Procedure: RIGHT TOTAL KNEE ARTHROPLASTY;  Surgeon: [name], MD;  [facility];  Service: Orthopedics;  Laterality: Right;    There were no vitals filed for this visit.   Subjective Assessment - 04/23/18 0847    Subjective  Pt is a 77 y/o male who presents to [facility] for Rt TKA on 03/23/18.  Pt presents today with continued difficulty with ambulation and pain with bed mobility.      Pertinent History  Lt TKA, bil RTC repair x 2, Lt THA (08/2017), sleep apnea, hx prostate cancer    Limitations  House hold activities    How long can you walk comfortably?  "I don't know"    Patient Stated Goals  improve ROM, walk normal    Currently in Pain?  Yes    Pain Score  0-No pain   up to 6/10   Pain Location  Knee    Pain Orientation  Right    Pain Descriptors / Indicators  Aching;Stabbing    Pain Type  Surgical pain    Pain Onset  More than a month ago    Pain Frequency  Intermittent    Aggravating Factors   bending knee, lying down  at night    Pain Relieving Factors  avoiding positions that cause pain, standing and sitting         [facility] PT Assessment - 04/23/18 0853      Assessment   Medical Diagnosis  Z96.651 (ICD-10-CM) - Status post total knee replacement, right    Referring Provider (PT)  [name], MD    Onset Date/Surgical Date  04/22/18    Next MD Visit  05/03/18    Prior Therapy  [facility]      Precautions   Precautions  None      Restrictions   Weight Bearing Restrictions  No      Balance Screen   Has the patient fallen in the past 6 months  No    Has the patient had a decrease in activity level because of a fear of falling?   No    Is the patient reluctant to leave their home because of a fear of falling?   No      Home Film/video editor residence    Living Arrangements  Spouse/significant other    [place] to enter    Entrance Stairs-Number of Steps  2    [place]  One level      Prior Function   Level of [place]  Retired    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)    Leisure  "I don't do anything for fun"      Cognition   Overall Cognitive Status  Within Functional Limits for tasks assessed      Observation/Other Assessments   Skin Integrity  steri strips still on incision; healing incision on Rt knee; increased edema Rt knee distally    Focus on Therapeutic Outcomes (FOTO)   59 (41% limited; predicted 36% limited)      ROM / Strength   AROM / PROM / Strength  AROM;PROM;Strength      AROM   AROM Assessment Site  Knee    Right/Left Knee  Right;Left    Right Knee Extension  5    Right Knee Flexion  96    Left Knee Extension  0    Left Knee Flexion  110      PROM   PROM Assessment Site  Knee    Right/Left Knee  Right    Right Knee Extension  2    Right Knee Flexion  102      Strength   Strength Assessment Site  Hip;Knee;Ankle    Right/Left Hip  Right;Left    Right Hip Flexion  4/5    Left Hip Flexion  4/5    Right/Left Knee  Right;Left    Right Knee Flexion  3-/5    Right Knee Extension  3-/5    Left Knee Flexion  3-/5    Left Knee Extension  3-/5    Right/Left Ankle  Right;Left    Right Ankle Dorsiflexion  5/5    Left Ankle Dorsiflexion  5/5      Palpation   Palpation comment  tenderness lateral quad on Rt      Ambulation/Gait   Gait Comments  antalgic gait on Rt; no device                Objective measurements completed on examination: See above findings.      [facility] Adult PT Treatment/Exercise -  04/23/18 0853      Exercises   Exercises  Knee/Hip      Knee/Hip Exercises: Supine   Quad Sets  Right;10 reps    Heel Slides  AAROM;Right;10 reps    Straight Leg Raises  Right;10 reps    Straight Leg Raises Limitations  cues to decrease extensor lag      Modalities   Modalities  Vasopneumatic      Vasopneumatic    Number Minutes Vasopneumatic   15 minutes    Vasopnuematic Location   Knee    Vasopneumatic Pressure  Low    Vasopneumatic Temperature   34*             PT Education - 04/23/18 0928    Education Details  HEP    Person(s) Educated  Patient    Methods  Explanation;Demonstration;Handout    Comprehension  Verbalized understanding       PT Short Term Goals - 04/23/18 1102      PT SHORT TERM GOAL #1   Title  independent with HEP    Status  New    Target Date  06/04/18      PT SHORT TERM GOAL #2   Title  Rt knee AROM improved 0-105 for improved function and mobility    Status  New    Target Date  05/21/18      PT SHORT TERM GOAL #3   Title  amb > 150' with minimal gait deviations for improved mobility    Status  New    Target Date  05/21/18      PT SHORT TERM GOAL #4   Title  report 50% improvement in sleep for improved pain    Status  New    Target Date  05/21/18        PT Long Term Goals - 04/23/18 1105      PT LONG TERM GOAL #1   Title  FOTO score improve to </= 36% limited for improved function    Status  New    Target Date  06/18/18      PT LONG TERM GOAL #2   Title  Rt knee AROM improved to 0-110 for improved function    Status  New    Target Date  06/18/18      PT LONG TERM GOAL #3   Title  negotiate 2 steps without hand rail reciprocally for improved function and mobility    Status  New    Target Date  06/18/18      PT LONG TERM GOAL #4   Title  report 75% improvement in sleep for improved function    Status  New    Target Date  06/18/18             Plan - 04/23/18 1053    Clinical Impression Statement  Pt is a 77 y/o male who presents to [facility] s/p Rt TKA on 03/23/18.  Pt demonstrates gait abnormalities, decreased strength and ROM as well as pain affecting sleep and functional mobility.  Pt will benefit from PT to address deficits listed.    History and Personal Factors relevant to plan of care:  multiple orthopedic surgeries    Clinical  Presentation  Stable    Clinical Decision Making  Low    Rehab Potential  Good    PT Frequency  2x / week    PT Duration  8 weeks   anticipate d/c in 4-8 weeks   PT Treatment/Interventions  ADLs/Self  Care Home Management;Cryotherapy;Electrical Stimulation;Ultrasound;Gait training;Stair training;Functional mobility training;Therapeutic activities;Moist Heat;Therapeutic exercise;Balance training;Neuromuscular re-education;Patient/family education;Scar mobilization;Manual techniques;Passive range of motion;Vasopneumatic Device;Taping    PT Next Visit Plan  review HEP and progress, aggressive ROM; standing exercises, gait training, manual/modalities    PT Home Exercise Plan  Access Code: [email or web address]    Consulted and Agree with Plan of Care  Patient       Patient will benefit from skilled therapeutic intervention in order to improve the following deficits and impairments:  Abnormal gait, Increased fascial restricitons, Pain, Increased muscle spasms, Decreased range of motion, Decreased strength, Impaired flexibility, Difficulty walking, Decreased balance  Visit Diagnosis: Acute pain of right knee - Plan: PT plan of care cert/re-cert  Stiffness of right knee, not elsewhere classified - Plan: PT plan of care cert/re-cert  Other abnormalities of gait and mobility - Plan: PT plan of care cert/re-cert     Problem List Patient Active Problem List   Diagnosis Date Noted  . Unilateral primary osteoarthritis, right knee 03/23/2018  . Status post total knee replacement, right 03/23/2018  . 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 Status post left hip replacement 09/13/2016      Laureen Abrahams, PT, DPT 04/23/18 11:10 AM     [facility] [place], [place], [address] Phone: [phone]   Fax:  [phone]  Name: KHAYREE DELELLIS MRN: [id number] Date of Birth: 1941-05-07

## 2018-04-23 NOTE — Patient Instructions (Signed)
Access Code: [email or web address]  URL: [email or web address]  Date: 04/23/2018  Prepared by: Faustino Congress   Exercises  Supine Quad Set - 10 reps - 3 sets - 5 sec hold - 1x daily - 7x weekly  Supine Straight Leg Raises - 10 reps - 3 sets - 1x daily - 7x weekly  Supine Heel Slide with Strap - 10 reps - 3 sets - 1x daily - 7x weekly

## 2018-04-25 ENCOUNTER — Ambulatory Visit (INDEPENDENT_AMBULATORY_CARE_PROVIDER_SITE_OTHER): Payer: Medicare HMO | Admitting: Physical Therapy

## 2018-04-25 DIAGNOSIS — R2689 Other abnormalities of gait and mobility: Secondary | ICD-10-CM | POA: Diagnosis not present

## 2018-04-25 DIAGNOSIS — M25561 Pain in right knee: Secondary | ICD-10-CM | POA: Diagnosis not present

## 2018-04-25 DIAGNOSIS — M25661 Stiffness of right knee, not elsewhere classified: Secondary | ICD-10-CM | POA: Diagnosis not present

## 2018-04-25 NOTE — Therapy (Signed)
[place], [place], [address] Phone: [phone]   Fax:  [phone]  Physical Therapy Treatment  Patient Details  Name: Manuel Hunter MRN: [id number] Date of Birth: 07-Sep-1940 Referring Provider (PT): [name], MD   Encounter Date: 04/25/2018  PT End of Session - 04/25/18 0808    Visit Number  2    Number of Visits  16    Date for PT Re-Evaluation  06/18/18    Authorization Type  [employer]    PT Start Time  0805    PT Stop Time  0855    PT Time Calculation (min)  50 min    Activity Tolerance  Patient tolerated treatment well    Behavior During Therapy  [facility] for tasks assessed/performed       Past Medical History:  Diagnosis Date  . Arthritis   . Complication of anesthesia    States "I woke up to soon one time" with rotator cuff repair  . GERD (gastroesophageal reflux disease)   . Headache   . History of hiatal hernia   . Lower extremity edema    occasionally takes furosemide prn  . Prostate cancer ([place]) 2007   tx with surgery  . RLS (restless legs syndrome)   . Rotator cuff tear    right  . Sleep apnea    uses CPAP nightly    Past Surgical History:  Procedure Laterality Date  . FINGER ARTHROPLASTY Left 03/10/2017   Procedure: Left index finger metacarpal phalangeal arthroplasty;  Surgeon: Roseanne Kaufman, MD;  Location: [facility];  Service: Orthopedics;  Laterality: Left;  90 mins  . KNEE SURGERY Bilateral   . left knee replacment  2009  . MANDIBLE FRACTURE SURGERY     left 2 titanium plates and 10 scres  . NECK SURGERY     cervical with bone graft  . PROSTATE SURGERY     prostatctomy  . ROTATOR CUFF REPAIR     x 2 right x 2 left  . TOTAL HIP ARTHROPLASTY Left 09/13/2016   Procedure: LEFT TOTAL HIP ARTHROPLASTY ANTERIOR APPROACH;  Surgeon: [name], MD;  Location: [facility];  Service: Orthopedics;  Laterality: Left;  . TOTAL KNEE  ARTHROPLASTY Right 03/23/2018   Procedure: RIGHT TOTAL KNEE ARTHROPLASTY;  Surgeon: [name], MD;  [facility];  Service: Orthopedics;  Laterality: Right;    There were no vitals filed for this visit.  Subjective Assessment - 04/25/18 0808    Subjective  "I'm just having such a hard time in the bed (sleeping)."     Patient Stated Goals  improve ROM, walk normal    Currently in Pain?  No/denies    Pain Score  0-No pain         [facility] PT Assessment - 04/25/18 0001      Assessment   Medical Diagnosis  Z96.651 (ICD-10-CM) - Status post total knee replacement, right    Referring Provider (PT)  [name], MD    Onset Date/Surgical Date  04/22/18    Next MD Visit  05/03/18    Prior Therapy  [facility]      Observation/Other Assessments-Edema    Edema  Circumferential      Circumferential Edema   Circumferential - Right  10cm from joint line (calf): 42 cm    Circumferential - Left   37 cm.      AROM   AROM Assessment Site  Knee  Right/Left Knee  Right    Right Knee Flexion  101        [facility] Adult PT Treatment/Exercise - 04/25/18 0001      Exercises   Exercises  Knee/Hip      Knee/Hip Exercises: Stretches   Passive Hamstring Stretch  Right;30 seconds;4 reps   supine x 2, seated x 1, standingx 1   Gastroc Stretch  Right;Left;2 reps;30 seconds    Other Knee/Hip Stretches  seated scoots for increased Rt knee ROM x 10 reps       Knee/Hip Exercises: Aerobic   Nustep  L4: 6 min      Knee/Hip Exercises: Standing   Lateral Step Up  Right;1 set;10 reps;Hand Hold: 2;Step Height: 6"    Forward Step Up  Right;1 set;15 reps;Hand Hold: 2;Step Height: 6"      Knee/Hip Exercises: Supine   Quad Sets  Right;10 reps   10 sec hold   Straight Leg Raises  Right;10 reps    Straight Leg Raises Limitations  cues for quad set prior to lifting leg      Vasopneumatic   Number Minutes Vasopneumatic   15 minutes    Vasopnuematic Location   Knee   Rt    Vasopneumatic Pressure  Medium    Vasopneumatic Temperature   34 deg             PT Education - 04/25/18 0919    Education Details  HEP; self care of using compression sock (including rationale and RICE principle)    Person(s) Educated  Patient;Spouse    Methods  Explanation;Handout    Comprehension  Verbalized understanding       PT Short Term Goals - 04/23/18 1102      PT SHORT TERM GOAL #1   Title  independent with HEP    Status  New    Target Date  06/04/18      PT SHORT TERM GOAL #2   Title  Rt knee AROM improved 0-105 for improved function and mobility    Status  New    Target Date  05/21/18      PT SHORT TERM GOAL #3   Title  amb > 150' with minimal gait deviations for improved mobility    Status  New    Target Date  05/21/18      PT SHORT TERM GOAL #4   Title  report 50% improvement in sleep for improved pain    Status  New    Target Date  05/21/18        PT Long Term Goals - 04/23/18 1105      PT LONG TERM GOAL #1   Title  FOTO score improve to </= 36% limited for improved function    Status  New    Target Date  06/18/18      PT LONG TERM GOAL #2   Title  Rt knee AROM improved to 0-110 for improved function    Status  New    Target Date  06/18/18      PT LONG TERM GOAL #3   Title  negotiate 2 steps without hand rail reciprocally for improved function and mobility    Status  New    Target Date  06/18/18      PT LONG TERM GOAL #4   Title  report 75% improvement in sleep for improved function    Status  New    Target Date  06/18/18  Plan - 04/25/18 0852    Clinical Impression Statement  Pt tolerated all exercises well, without difficulty and minimal increase in pain.   Rt lower leg has visible increase in swelling; encouraged pt to return to wearing compression stocking.  Pt will benefit from continued PT intervention to improve functional mobility.     Rehab Potential  Good    PT Frequency  2x / week    PT Duration  8 weeks     PT Treatment/Interventions  ADLs/Self Care Home Management;Cryotherapy;Electrical Stimulation;Ultrasound;Gait training;Stair training;Functional mobility training;Therapeutic activities;Moist Heat;Therapeutic exercise;Balance training;Neuromuscular re-education;Patient/family education;Scar mobilization;Manual techniques;Passive range of motion;Vasopneumatic Device;Taping    PT Next Visit Plan  review HEP and progress, aggressive ROM; standing exercises, gait training, manual/modalities    PT Home Exercise Plan  Access Code: [email or web address]    Consulted and Agree with Plan of Care  Patient       Patient will benefit from skilled therapeutic intervention in order to improve the following deficits and impairments:  Abnormal gait, Increased fascial restricitons, Pain, Increased muscle spasms, Decreased range of motion, Decreased strength, Impaired flexibility, Difficulty walking, Decreased balance  Visit Diagnosis: Acute pain of right knee  Stiffness of right knee, not elsewhere classified  Other abnormalities of gait and mobility     Problem List Patient Active Problem List   Diagnosis Date Noted  . Unilateral primary osteoarthritis, right knee 03/23/2018  . Status post total knee replacement, right 03/23/2018  . 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 Status post left hip replacement 09/13/2016   Kerin Perna, PTA 04/25/18 9:34 AM  [facility] [place], [place], [address] Phone: [phone]   Fax:  [phone]  Name: Manuel Hunter MRN: [id number] Date of Birth: 1940-08-31

## 2018-04-30 ENCOUNTER — Encounter: Payer: Self-pay | Admitting: Physical Therapy

## 2018-04-30 ENCOUNTER — Ambulatory Visit (INDEPENDENT_AMBULATORY_CARE_PROVIDER_SITE_OTHER): Payer: Medicare HMO | Admitting: Physical Therapy

## 2018-04-30 DIAGNOSIS — M25561 Pain in right knee: Secondary | ICD-10-CM

## 2018-04-30 DIAGNOSIS — M25661 Stiffness of right knee, not elsewhere classified: Secondary | ICD-10-CM | POA: Diagnosis not present

## 2018-04-30 DIAGNOSIS — R2689 Other abnormalities of gait and mobility: Secondary | ICD-10-CM

## 2018-04-30 NOTE — Therapy (Signed)
[place], [place], [address] Phone: [phone]   Fax:  [phone]  Physical Therapy Treatment  Patient Details  Name: Manuel Hunter MRN: [id number] Date of Birth: 10-Sep-1940 Referring Provider (PT): [name], MD   Encounter Date: 04/30/2018  PT End of Session - 04/30/18 1146    Visit Number  3    Number of Visits  16    Date for PT Re-Evaluation  06/18/18    Authorization Type  [employer]    PT Start Time  1100    PT Stop Time  1201    PT Time Calculation (min)  61 min    Activity Tolerance  Patient tolerated treatment well    Behavior During Therapy  [facility] for tasks assessed/performed       Past Medical History:  Diagnosis Date  . Arthritis   . Complication of anesthesia    States "I woke up to soon one time" with rotator cuff repair  . GERD (gastroesophageal reflux disease)   . Headache   . History of hiatal hernia   . Lower extremity edema    occasionally takes furosemide prn  . Prostate cancer ([place]) 2007   tx with surgery  . RLS (restless legs syndrome)   . Rotator cuff tear    right  . Sleep apnea    uses CPAP nightly    Past Surgical History:  Procedure Laterality Date  . FINGER ARTHROPLASTY Left 03/10/2017   Procedure: Left index finger metacarpal phalangeal arthroplasty;  Surgeon: Roseanne Kaufman, MD;  Location: [facility];  Service: Orthopedics;  Laterality: Left;  90 mins  . KNEE SURGERY Bilateral   . left knee replacment  2009  . MANDIBLE FRACTURE SURGERY     left 2 titanium plates and 10 scres  . NECK SURGERY     cervical with bone graft  . PROSTATE SURGERY     prostatctomy  . ROTATOR CUFF REPAIR     x 2 right x 2 left  . TOTAL HIP ARTHROPLASTY Left 09/13/2016   Procedure: LEFT TOTAL HIP ARTHROPLASTY ANTERIOR APPROACH;  Surgeon: [name], MD;  Location: [facility];  Service: Orthopedics;  Laterality: Left;  . TOTAL KNEE  ARTHROPLASTY Right 03/23/2018   Procedure: RIGHT TOTAL KNEE ARTHROPLASTY;  Surgeon: [name], MD;  [facility];  Service: Orthopedics;  Laterality: Right;    There were no vitals filed for this visit.  Subjective Assessment - 04/30/18 1101    Subjective  having pain when rolling over at night    Pertinent History  Lt TKA, bil RTC repair x 2, Lt THA (08/2017), sleep apnea, hx prostate cancer    Patient Stated Goals  improve ROM, walk normal    Currently in Pain?  No/denies         [facility] PT Assessment - 04/30/18 1136      Assessment   Medical Diagnosis  Z96.651 (ICD-10-CM) - Status post total knee replacement, right    Referring Provider (PT)  [name], MD    Onset Date/Surgical Date  04/22/18    Next MD Visit  05/03/18      PROM   Right Knee Flexion  110                   [facility] Adult PT Treatment/Exercise - 04/30/18 1104      Exercises   Exercises  Knee/Hip      Knee/Hip Exercises: Stretches  Gastroc Stretch  Right;3 reps;30 seconds      Knee/Hip Exercises: Aerobic   Recumbent Bike  partial revolutions x 8 min      Knee/Hip Exercises: Standing   Terminal Knee Extension  Right;2 sets;10 reps;Theraband    Theraband Level (Terminal Knee Extension)  Level 3 (Green)      Knee/Hip Exercises: Supine   Quad Sets  Right;10 reps;2 sets   10 sec hold   Heel Slides  AAROM;Right;10 reps;2 sets   with strap for additional ROM     Vasopneumatic   Number Minutes Vasopneumatic   15 minutes    Vasopnuematic Location   Knee    Vasopneumatic Pressure  Medium    Vasopneumatic Temperature   34 deg      Manual Therapy   Manual Therapy  Passive ROM    Passive ROM  Rt knee supine into extension; sitting knee flexion               PT Short Term Goals - 04/23/18 1102      PT SHORT TERM GOAL #1   Title  independent with HEP    Status  New    Target Date  06/04/18      PT SHORT TERM GOAL #2   Title  Rt knee AROM improved 0-105  for improved function and mobility    Status  New    Target Date  05/21/18      PT SHORT TERM GOAL #3   Title  amb > 150' with minimal gait deviations for improved mobility    Status  New    Target Date  05/21/18      PT SHORT TERM GOAL #4   Title  report 50% improvement in sleep for improved pain    Status  New    Target Date  05/21/18        PT Long Term Goals - 04/23/18 1105      PT LONG TERM GOAL #1   Title  FOTO score improve to </= 36% limited for improved function    Status  New    Target Date  06/18/18      PT LONG TERM GOAL #2   Title  Rt knee AROM improved to 0-110 for improved function    Status  New    Target Date  06/18/18      PT LONG TERM GOAL #3   Title  negotiate 2 steps without hand rail reciprocally for improved function and mobility    Status  New    Target Date  06/18/18      PT LONG TERM GOAL #4   Title  report 75% improvement in sleep for improved function    Status  New    Target Date  06/18/18            Plan - 04/30/18 1338    Clinical Impression Statement  Pt with improved PROM today and feel he will be close to meeting ROM STG next visit.  Pt progressing well with PT however swelling continues to be main complaint at this time.  Will continue to benefit from PT to maximize function.    Rehab Potential  Good    PT Frequency  2x / week    PT Duration  8 weeks    PT Treatment/Interventions  ADLs/Self Care Home Management;Cryotherapy;Electrical Stimulation;Ultrasound;Gait training;Stair training;Functional mobility training;Therapeutic activities;Moist Heat;Therapeutic exercise;Balance training;Neuromuscular re-education;Patient/family education;Scar mobilization;Manual techniques;Passive range of motion;Vasopneumatic Device;Taping    PT Next Visit Plan  measure and send note to MD, aggressive ROM; standing exercises, gait training, manual/modalities    PT Home Exercise Plan  Access Code: [email or web address]    Consulted and Agree with Plan of Care   Patient       Patient will benefit from skilled therapeutic intervention in order to improve the following deficits and impairments:  Abnormal gait, Increased fascial restricitons, Pain, Increased muscle spasms, Decreased range of motion, Decreased strength, Impaired flexibility, Difficulty walking, Decreased balance  Visit Diagnosis: Acute pain of right knee  Stiffness of right knee, not elsewhere classified  Other abnormalities of gait and mobility     Problem List Patient Active Problem List   Diagnosis Date Noted  . Unilateral primary osteoarthritis, right knee 03/23/2018  . Status post total knee replacement, right 03/23/2018  . 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 Status post left hip replacement 09/13/2016      Laureen Abrahams, PT, DPT 04/30/18 1:40 PM    [facility] [place], [place], [address] Phone: [phone]   Fax:  [phone]  Name: SHAHZAD THOMANN MRN: [id number] Date of Birth: 02/18/1941

## 2018-05-02 ENCOUNTER — Encounter: Payer: Self-pay | Admitting: Physical Therapy

## 2018-05-02 ENCOUNTER — Ambulatory Visit (INDEPENDENT_AMBULATORY_CARE_PROVIDER_SITE_OTHER): Payer: Medicare HMO | Admitting: Physical Therapy

## 2018-05-02 DIAGNOSIS — M25561 Pain in right knee: Secondary | ICD-10-CM | POA: Diagnosis not present

## 2018-05-02 DIAGNOSIS — M25661 Stiffness of right knee, not elsewhere classified: Secondary | ICD-10-CM | POA: Diagnosis not present

## 2018-05-02 DIAGNOSIS — R2689 Other abnormalities of gait and mobility: Secondary | ICD-10-CM

## 2018-05-02 NOTE — Therapy (Signed)
[place], [place], [address] Phone: [phone]   Fax:  [phone]  Physical Therapy Treatment  Patient Details  Name: Manuel Hunter MRN: [id number] Date of Birth: [birth year] Referring Provider (PT): [name], MD   Encounter Date: 05/02/2018  PT End of Session - 05/02/18 1652    Visit Number  4    Number of Visits  16    Date for PT Re-Evaluation  06/18/18    Authorization Type  [employer]    PT Start Time  1600    PT Stop Time  1653    PT Time Calculation (min)  53 min    Activity Tolerance  Patient tolerated treatment well    Behavior During Therapy  [facility] for tasks assessed/performed       Past Medical History:  Diagnosis Date  . Arthritis   . Complication of anesthesia    States "I woke up to soon one time" with rotator cuff repair  . GERD (gastroesophageal reflux disease)   . Headache   . History of hiatal hernia   . Lower extremity edema    occasionally takes furosemide prn  . Prostate cancer ([place]) 2007   tx with surgery  . RLS (restless legs syndrome)   . Rotator cuff tear    right  . Sleep apnea    uses CPAP nightly    Past Surgical History:  Procedure Laterality Date  . FINGER ARTHROPLASTY Left 03/10/2017   Procedure: Left index finger metacarpal phalangeal arthroplasty;  Surgeon: Roseanne Kaufman, MD;  Location: [place];  Service: Orthopedics;  Laterality: Left;  90 mins  . KNEE SURGERY Bilateral   . left knee replacment  2009  . MANDIBLE FRACTURE SURGERY     left 2 titanium plates and 10 scres  . NECK SURGERY     cervical with bone graft  . PROSTATE SURGERY     prostatctomy  . ROTATOR CUFF REPAIR     x 2 right x 2 left  . TOTAL HIP ARTHROPLASTY Left 09/13/2016   Procedure: LEFT TOTAL HIP ARTHROPLASTY ANTERIOR APPROACH;  Surgeon: [name], MD;  Location: [place];  Service: Orthopedics;  Laterality: Left;  . TOTAL KNEE  ARTHROPLASTY Right 03/23/2018   Procedure: RIGHT TOTAL KNEE ARTHROPLASTY;  Surgeon: [name], MD;  [facility];  Service: Orthopedics;  Laterality: Right;    There were no vitals filed for this visit.  Subjective Assessment - 05/02/18 1616    Subjective  Pt believes he is doing very well.  He complains of difficulty sleeping due to pain when rolling over at night.  He has been busy working 10 hr days (last 2 days) for [employer].  Returns to MD tomorrow.     Patient Stated Goals  improve ROM, walk normal    Currently in Pain?  No/denies    Pain Score  0-No pain         [facility] PT Assessment - 05/02/18 0001      Assessment   Medical Diagnosis  Z96.651 (ICD-10-CM) - Status post total knee replacement, right    Referring Provider (PT)  [name], MD    Onset Date/Surgical Date  04/22/18    Next MD Visit  05/03/18      AROM   Right/Left Knee  Right    Right Knee Extension  6    Right Knee Flexion  110      Strength  Right/Left Hip  Right    Right Hip Flexion  5/5    Right Hip ABduction  5/5       [facility] Adult PT Treatment/Exercise - 05/02/18 0001      Exercises   Exercises  Knee/Hip      Knee/Hip Exercises: Stretches   Passive Hamstring Stretch  Right;2 reps;30 seconds   foot on 13" step with overpressure from hand   Gastroc Stretch  Right;Left;2 reps;30 seconds    Soleus Stretch  Right;2 reps;30 seconds    Other Knee/Hip Stretches  seated scoots for increased Rt knee ROM x 5 reps       Knee/Hip Exercises: Aerobic   Nustep  L5: 6 min   PTA present to monitor and discuss progress   Other Aerobic  laps around gym in between exercises to decrease stiffness      Knee/Hip Exercises: Standing   Forward Step Up  Right;1 set;10 reps;Hand Hold: 1;Step Height: 8"    Stairs  reciprocal pattern up and down 5 steps (3" and 6") with single rail x 1 rep    SLS  Rt/Lt SLS x 15 sec x 2 reps each leg with light UE support to steady.         Knee/Hip Exercises: Supine   Quad Sets  Right;1 set;5 reps    Heel Slides  AAROM;Right   with strap for additional ROM. 1 rep for measurement     Vasopneumatic   Number Minutes Vasopneumatic   15 minutes    Vasopnuematic Location   Knee    Vasopneumatic Pressure  Medium    Vasopneumatic Temperature   34 deg      Manual Therapy   Manual Therapy  Soft tissue mobilization;Passive ROM    Manual therapy comments  pt in supine    Soft tissue mobilization  STM to Rt quad and hamstring to decrease fascial tightness and improve ROM.     Passive ROM  Rt knee supine into extension               PT Short Term Goals - 05/02/18 1657      PT SHORT TERM GOAL #1   Title  independent with HEP    Status  On-going      PT SHORT TERM GOAL #2   Title  Rt knee AROM improved 0-105 for improved function and mobility    Status  Partially Met      PT SHORT TERM GOAL #3   Title  amb > 150' with minimal gait deviations for improved mobility    Status  Achieved      PT SHORT TERM GOAL #4   Title  report 50% improvement in sleep for improved pain    Status  On-going        PT Long Term Goals - 04/23/18 1105      PT LONG TERM GOAL #1   Title  FOTO score improve to </= 36% limited for improved function    Status  New    Target Date  06/18/18      PT LONG TERM GOAL #2   Title  Rt knee AROM improved to 0-110 for improved function    Status  New    Target Date  06/18/18      PT LONG TERM GOAL #3   Title  negotiate 2 steps without hand rail reciprocally for improved function and mobility    Status  New    Target Date  06/18/18  PT LONG TERM GOAL #4   Title  report 75% improvement in sleep for improved function    Status  New    Target Date  06/18/18            Plan - 05/02/18 1650    Clinical Impression Statement  Pt's Rt knee ROM and Rt hip strength have improved.  He has significant swell in his RLE, with some pitting up into his distal thigh.  Pt has been wearing  compression sock to knee to assist with edema reduction.  Pt tolerated stairs well and is making good gains towards all remaining goals.     Rehab Potential  Good    PT Frequency  2x / week    PT Duration  8 weeks    PT Treatment/Interventions  ADLs/Self Care Home Management;Cryotherapy;Electrical Stimulation;Ultrasound;Gait training;Stair training;Functional mobility training;Therapeutic activities;Moist Heat;Therapeutic exercise;Balance training;Neuromuscular re-education;Patient/family education;Scar mobilization;Manual techniques;Passive range of motion;Vasopneumatic Device;Taping    PT Next Visit Plan  continued focus on ROM and functional mobility.     PT Home Exercise Plan  Access Code: [email or web address]    Consulted and Agree with Plan of Care  Patient       Patient will benefit from skilled therapeutic intervention in order to improve the following deficits and impairments:  Abnormal gait, Increased fascial restricitons, Pain, Increased muscle spasms, Decreased range of motion, Decreased strength, Impaired flexibility, Difficulty walking, Decreased balance  Visit Diagnosis: Acute pain of right knee  Stiffness of right knee, not elsewhere classified  Other abnormalities of gait and mobility     Problem List Patient Active Problem List   Diagnosis Date Noted  . Unilateral primary osteoarthritis, right knee 03/23/2018  . Status post total knee replacement, right 03/23/2018  . 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 Status post left hip replacement 09/13/2016   Kerin Perna, PTA 05/02/18 4:58 PM  [place], [place], [address] Phone: [phone]   Fax:  [phone]  Name: Manuel Hunter MRN: [id number] Date of Birth: 1941-04-25

## 2018-05-03 ENCOUNTER — Other Ambulatory Visit (INDEPENDENT_AMBULATORY_CARE_PROVIDER_SITE_OTHER): Payer: Self-pay

## 2018-05-03 ENCOUNTER — Ambulatory Visit (INDEPENDENT_AMBULATORY_CARE_PROVIDER_SITE_OTHER): Payer: Medicare HMO | Admitting: Orthopaedic Surgery

## 2018-05-03 ENCOUNTER — Encounter (INDEPENDENT_AMBULATORY_CARE_PROVIDER_SITE_OTHER): Payer: Self-pay | Admitting: Orthopaedic Surgery

## 2018-05-03 DIAGNOSIS — Z96651 Presence of right artificial knee joint: Secondary | ICD-10-CM

## 2018-05-03 DIAGNOSIS — M7989 Other specified soft tissue disorders: Secondary | ICD-10-CM

## 2018-05-03 MED ORDER — METHOCARBAMOL 500 MG PO TABS: 500.0000 mg | ORAL_TABLET | Freq: Four times a day (QID) | ORAL | 0 refills | Status: DC | PRN

## 2018-05-03 MED ORDER — NAPROXEN 500 MG PO TABS: 500.0000 mg | ORAL_TABLET | Freq: Two times a day (BID) | ORAL | 1 refills | Status: DC

## 2018-05-03 MED ORDER — OXYCODONE HCL 5 MG PO TABS: 5.0000 mg | ORAL_TABLET | ORAL | 0 refills | Status: DC | PRN

## 2018-05-03 NOTE — Progress Notes (Signed)
Manuel Hunter is now 6 weeks status post a right total knee arthroplasty.  On exam his extension is almost full and I can flex to 110 degrees.  This is much improved.  He still has significant swelling throughout his right leg and foot.  His calf is soft but certainly is concerning for the possibility of a DVT.  He is continuing therapy as well.  His knee looks good overall.  At this point I would like to obtain an ultrasound release tomorrow to rule out DVT.  We will call him with the results.  He is going continue outpatient therapy in the interim.  I did wean him from Dilaudid now to oxycodone.  He will continue the weaning process.  I will send in some more methocarbamol and naproxen as well.  Obviously if he does have a DVT we will address accordingly.  Regardless we will see him back in 4 weeks he is doing overall.  All question concerns were answered and addressed.  Also have him wear compressive garments.

## 2018-05-04 ENCOUNTER — Ambulatory Visit (HOSPITAL_COMMUNITY)
Admission: RE | Admit: 2018-05-04 | Discharge: 2018-05-04 | Disposition: A | Discharge status: Home / Self Care | Payer: Medicare HMO | Source: Ambulatory Visit | Attending: Orthopaedic Surgery | Admitting: Orthopaedic Surgery

## 2018-05-04 ENCOUNTER — Telehealth (INDEPENDENT_AMBULATORY_CARE_PROVIDER_SITE_OTHER): Payer: Self-pay

## 2018-05-04 ENCOUNTER — Other Ambulatory Visit (INDEPENDENT_AMBULATORY_CARE_PROVIDER_SITE_OTHER): Payer: Self-pay | Admitting: Orthopaedic Surgery

## 2018-05-04 DIAGNOSIS — M7989 Other specified soft tissue disorders: Secondary | ICD-10-CM | POA: Insufficient documentation

## 2018-05-04 NOTE — Telephone Encounter (Signed)
Patients wife called on behalf of patient. Stated patient saw Dr Ninfa Linden saw patient yesterday and had concerns of blood clot and expressed to them that he wanted patient to have doppler either yesterday or today. She states [facility] called to schedule appointment but not until this afternoon and they cannot do doppler until Monday. She is anxious to wait until Monday due to concern of possible blood clot.  Also, they do not want to have to drive back to [facility] and would rather have this done somewhere closer to home in [facility]. Can you please see if we can get this done for them today. Best contact for them is their home number which is [phone]

## 2018-05-04 NOTE — Telephone Encounter (Signed)
Pt is scheduled with [facility] tonight at 5pm, order was changed to their protocol at [facility]. Pt is aware of appt. Also, Lauren contacted Dr. Ninfa Linden and he is going to sign the order before 5pm.

## 2018-05-04 NOTE — Telephone Encounter (Signed)
Duplicate encounter

## 2018-05-07 ENCOUNTER — Ambulatory Visit (HOSPITAL_COMMUNITY): Admission: RE | Admit: 2018-05-07 | Payer: Medicare HMO | Source: Ambulatory Visit

## 2018-05-07 ENCOUNTER — Telehealth (INDEPENDENT_AMBULATORY_CARE_PROVIDER_SITE_OTHER): Payer: Self-pay

## 2018-05-07 NOTE — Telephone Encounter (Signed)
Please advise 

## 2018-05-07 NOTE — Telephone Encounter (Signed)
Patient's wife Manuela Schwartz called concerning results for DVT.  Cb# is [phone].  Please advise.  Thank you.

## 2018-05-09 ENCOUNTER — Encounter: Payer: Self-pay | Admitting: Physical Therapy

## 2018-05-09 ENCOUNTER — Ambulatory Visit (INDEPENDENT_AMBULATORY_CARE_PROVIDER_SITE_OTHER): Payer: Medicare HMO | Admitting: Physical Therapy

## 2018-05-09 DIAGNOSIS — M25561 Pain in right knee: Secondary | ICD-10-CM

## 2018-05-09 DIAGNOSIS — M25661 Stiffness of right knee, not elsewhere classified: Secondary | ICD-10-CM | POA: Diagnosis not present

## 2018-05-09 DIAGNOSIS — R2689 Other abnormalities of gait and mobility: Secondary | ICD-10-CM | POA: Diagnosis not present

## 2018-05-09 NOTE — Therapy (Signed)
[place], [place], [address] Phone: [phone]   Fax:  [phone]  Physical Therapy Treatment  Patient Details  Name: Manuel Hunter MRN: [id number] Date of Birth: 03-Nov-1940 Referring Provider (PT): [name], MD   Encounter Date: 05/09/2018  PT End of Session - 05/09/18 1445    Visit Number  5    Number of Visits  16    Date for PT Re-Evaluation  06/18/18    Authorization Type  [employer]    PT Start Time  1430    PT Stop Time  8828    PT Time Calculation (min)  60 min    Activity Tolerance  Patient tolerated treatment well;No increased pain    Behavior During Therapy  WFL for tasks assessed/performed       Past Medical History:  Diagnosis Date  . Arthritis   . Complication of anesthesia    States "I woke up to soon one time" with rotator cuff repair  . GERD (gastroesophageal reflux disease)   . Headache   . History of hiatal hernia   . Lower extremity edema    occasionally takes furosemide prn  . Prostate cancer ([place]) 2007   tx with surgery  . RLS (restless legs syndrome)   . Rotator cuff tear    right  . Sleep apnea    uses CPAP nightly    Past Surgical History:  Procedure Laterality Date  . FINGER ARTHROPLASTY Left 03/10/2017   Procedure: Left index finger metacarpal phalangeal arthroplasty;  Surgeon: Roseanne Kaufman, MD;  Location: [facility] [place];  Service: Orthopedics;  Laterality: Left;  90 mins  . KNEE SURGERY Bilateral   . left knee replacment  2009  . MANDIBLE FRACTURE SURGERY     left 2 titanium plates and 10 scres  . NECK SURGERY     cervical with bone graft  . PROSTATE SURGERY     prostatctomy  . ROTATOR CUFF REPAIR     x 2 right x 2 left  . TOTAL HIP ARTHROPLASTY Left 09/13/2016   Procedure: LEFT TOTAL HIP ARTHROPLASTY ANTERIOR APPROACH;  Surgeon: [name], MD;  Location: [facility] [facility] [place];  Service: Orthopedics;  Laterality: Left;  .  TOTAL KNEE ARTHROPLASTY Right 03/23/2018   Procedure: RIGHT TOTAL KNEE ARTHROPLASTY;  Surgeon: [name], MD;  [facility];  Service: Orthopedics;  Laterality: Right;    There were no vitals filed for this visit.  Subjective Assessment - 05/09/18 1446    Subjective  Pt reports he plans to return to water aerobics tomorrow.  He went to MD and the doctor is pleased with his progress.  MD was concerned about DVT, but this was cleared.   He still has difficulty getting in and out car without pain.     Currently in Pain?  No/denies    Pain Score  0-No pain         [facility] PT Assessment - 05/09/18 0001      Assessment   Medical Diagnosis  Z96.651 (ICD-10-CM) - Status post total knee replacement, right    Referring Provider (PT)  [name], MD    Onset Date/Surgical Date  04/22/18    Next MD Visit  to be scheduled       AROM   Right/Left Knee  Right    Right Knee Flexion  112        [facility] Adult PT Treatment/Exercise - 05/09/18 0001  Exercises   Exercises  Knee/Hip      Knee/Hip Exercises: Stretches   Passive Hamstring Stretch  Right;2 reps;30 seconds   seated, with overpressure from hand   Gastroc Stretch  Right;Left;30 seconds;3 reps    Soleus Stretch  Right;20 seconds;1 rep    Other Knee/Hip Stretches  seated scoots for increased Rt knee ROM x 5 reps       Knee/Hip Exercises: Aerobic   Nustep  L5: 6 min   PTA present to monitor and discuss progress   Other Aerobic  laps around gym in between exercises to decrease stiffness      Knee/Hip Exercises: Standing   Forward Step Up  Right;1 set;10 reps;Hand Hold: 1;Step Height: 8"    SLS  Rt/Lt SLS x 20 sec x 2 reps each leg with light UE support to steady.        Knee/Hip Exercises: Seated   Other Seated Knee/Hip Exercises  sit to stand and sit pivot, (to simulate getting in/out of car) x 12 reps - VC for form and technique       Vasopneumatic   Number Minutes Vasopneumatic   15 minutes     Vasopnuematic Location   Knee    Vasopneumatic Pressure  Medium    Vasopneumatic Temperature   34 deg      Manual Therapy   Manual Therapy  Taping    Kinesiotex  Edema      Kinesiotix   Edema  Big daddy tape applied in web pattern across Rt ant knee to decrease edema.              PT Education - 05/09/18 1519    Education Details  ktape info     Person(s) Educated  Patient    Methods  Explanation;Handout    Comprehension  Verbalized understanding       PT Short Term Goals - 05/02/18 1657      PT SHORT TERM GOAL #1   Title  independent with HEP    Status  On-going      PT SHORT TERM GOAL #2   Title  Rt knee AROM improved 0-105 for improved function and mobility    Status  Partially Met      PT SHORT TERM GOAL #3   Title  amb > 150' with minimal gait deviations for improved mobility    Status  Achieved      PT SHORT TERM GOAL #4   Title  report 50% improvement in sleep for improved pain    Status  On-going        PT Long Term Goals - 04/23/18 1105      PT LONG TERM GOAL #1   Title  FOTO score improve to </= 36% limited for improved function    Status  New    Target Date  06/18/18      PT LONG TERM GOAL #2   Title  Rt knee AROM improved to 0-110 for improved function    Status  New    Target Date  06/18/18      PT LONG TERM GOAL #3   Title  negotiate 2 steps without hand rail reciprocally for improved function and mobility    Status  New    Target Date  06/18/18      PT LONG TERM GOAL #4   Title  report 75% improvement in sleep for improved function    Status  New    Target Date  06/18/18  Plan - 05/09/18 1658    Clinical Impression Statement  Continued persistant swelling in RLE affecting pt's Rt knee ROM. Despite swelling, he continues to gain ROM each visit.  Today worked extensively on problem solving and simulating how to get out of car with less pain (sit pivot and sit to stand).  Pt making progress towards goals and remains  motivated to meet his goals.     Rehab Potential  Good    PT Frequency  2x / week    PT Duration  8 weeks    PT Treatment/Interventions  ADLs/Self Care Home Management;Cryotherapy;Electrical Stimulation;Ultrasound;Gait training;Stair training;Functional mobility training;Therapeutic activities;Moist Heat;Therapeutic exercise;Balance training;Neuromuscular re-education;Patient/family education;Scar mobilization;Manual techniques;Passive range of motion;Vasopneumatic Device;Taping    PT Next Visit Plan  continued focus on ROM and functional mobility.     Consulted and Agree with Plan of Care  Patient       Patient will benefit from skilled therapeutic intervention in order to improve the following deficits and impairments:  Abnormal gait, Increased fascial restricitons, Pain, Increased muscle spasms, Decreased range of motion, Decreased strength, Impaired flexibility, Difficulty walking, Decreased balance  Visit Diagnosis: Acute pain of right knee  Stiffness of right knee, not elsewhere classified  Other abnormalities of gait and mobility     Problem List Patient Active Problem List   Diagnosis Date Noted  . Unilateral primary osteoarthritis, right knee 03/23/2018  . Status post total knee replacement, right 03/23/2018  . 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 Status post left hip replacement 09/13/2016   Kerin Perna, PTA 05/09/18 5:01 PM  [place], [place], [address] Phone: [phone]   Fax:  [phone]  Name: MARTESE VANATTA MRN: [id number] Date of Birth: 1940/10/04

## 2018-05-09 NOTE — Patient Instructions (Signed)

## 2018-05-11 ENCOUNTER — Encounter: Payer: Self-pay | Admitting: Physical Therapy

## 2018-05-11 ENCOUNTER — Ambulatory Visit (INDEPENDENT_AMBULATORY_CARE_PROVIDER_SITE_OTHER): Payer: Medicare HMO | Admitting: Physical Therapy

## 2018-05-11 DIAGNOSIS — M25661 Stiffness of right knee, not elsewhere classified: Secondary | ICD-10-CM | POA: Diagnosis not present

## 2018-05-11 DIAGNOSIS — M25561 Pain in right knee: Secondary | ICD-10-CM | POA: Diagnosis not present

## 2018-05-11 DIAGNOSIS — R2689 Other abnormalities of gait and mobility: Secondary | ICD-10-CM | POA: Diagnosis not present

## 2018-05-11 NOTE — Therapy (Signed)
[place], [place], [address] Phone: [phone]   Fax:  [phone]  Physical Therapy Treatment  Patient Details  Name: Manuel Hunter MRN: [id number] Date of Birth: [birth year] Referring Provider (PT): [name], MD   Encounter Date: 05/11/2018  PT End of Session - 05/11/18 1025    Visit Number  6    Number of Visits  16    Date for PT Re-Evaluation  06/18/18    Authorization Type  [employer]    PT Start Time  1024    PT Stop Time  1113    PT Time Calculation (min)  49 min    Activity Tolerance  Patient tolerated treatment well;No increased pain    Behavior During Therapy  WFL for tasks assessed/performed       Past Medical History:  Diagnosis Date  . Arthritis   . Complication of anesthesia    States "I woke up to soon one time" with rotator cuff repair  . GERD (gastroesophageal reflux disease)   . Headache   . History of hiatal hernia   . Lower extremity edema    occasionally takes furosemide prn  . Prostate cancer ([place]) 2007   tx with surgery  . RLS (restless legs syndrome)   . Rotator cuff tear    right  . Sleep apnea    uses CPAP nightly    Past Surgical History:  Procedure Laterality Date  . FINGER ARTHROPLASTY Left 03/10/2017   Procedure: Left index finger metacarpal phalangeal arthroplasty;  Surgeon: Roseanne Kaufman, MD;  Location: [place];  Service: Orthopedics;  Laterality: Left;  90 mins  . KNEE SURGERY Bilateral   . left knee replacment  2009  . MANDIBLE FRACTURE SURGERY     left 2 titanium plates and 10 scres  . NECK SURGERY     cervical with bone graft  . PROSTATE SURGERY     prostatctomy  . ROTATOR CUFF REPAIR     x 2 right x 2 left  . TOTAL HIP ARTHROPLASTY Left 09/13/2016   Procedure: LEFT TOTAL HIP ARTHROPLASTY ANTERIOR APPROACH;  Surgeon: [name], MD;  Location: [facility];  Service: Orthopedics;  Laterality: Left;  .  TOTAL KNEE ARTHROPLASTY Right 03/23/2018   Procedure: RIGHT TOTAL KNEE ARTHROPLASTY;  Surgeon: [name], MD;  [facility];  Service: Orthopedics;  Laterality: Right;    There were no vitals filed for this visit.  Subjective Assessment - 05/11/18 1026    Subjective  Pt reports that the swelling seems to be a bit better today - taping has helped.     Currently in Pain?  No/denies                       [facility] Adult PT Treatment/Exercise - 05/11/18 1029      Knee/Hip Exercises: Stretches   Passive Hamstring Stretch  Right;3 reps;30 seconds   standing with heel on step   Other Knee/Hip Stretches  standing knee flexion into step 3x30 sec      Knee/Hip Exercises: Aerobic   Recumbent Bike  partial revolutions x 8 min      Knee/Hip Exercises: Standing   Terminal Knee Extension  Right;2 sets;10 reps;Theraband    Theraband Level (Terminal Knee Extension)  Level 4 (Blue)    Forward Step Up  Right;1 set;10 reps;Hand Hold: 1;Step Height: 8"    SLS  Light UE support RLE 5x30 sec  on level and compliant surface      Vasopneumatic   Number Minutes Vasopneumatic   15 minutes    Vasopnuematic Location   Knee    Vasopneumatic Pressure  High    Vasopneumatic Temperature   34 deg      Manual Therapy   Passive ROM  Rt knee flexion/extension               PT Short Term Goals - 05/11/18 1059      PT SHORT TERM GOAL #1   Title  independent with HEP    Status  On-going    Target Date  06/04/18      PT SHORT TERM GOAL #2   Title  Rt knee AROM improved 0-105 for improved function and mobility    Status  Partially Met      PT SHORT TERM GOAL #3   Title  amb > 150' with minimal gait deviations for improved mobility    Status  Achieved      PT SHORT TERM GOAL #4   Title  report 50% improvement in sleep for improved pain    Status  On-going        PT Long Term Goals - 04/23/18 1105      PT LONG TERM GOAL #1   Title  FOTO score improve to </= 36%  limited for improved function    Status  New    Target Date  06/18/18      PT LONG TERM GOAL #2   Title  Rt knee AROM improved to 0-110 for improved function    Status  New    Target Date  06/18/18      PT LONG TERM GOAL #3   Title  negotiate 2 steps without hand rail reciprocally for improved function and mobility    Status  New    Target Date  06/18/18      PT LONG TERM GOAL #4   Title  report 75% improvement in sleep for improved function    Status  New    Target Date  06/18/18            Plan - 05/11/18 1026    Clinical Impression Statement  Pt continues to demonstrate improved functional mobility with better SLS today.  ROM limited by edema today but overall doing well.    Rehab Potential  Good    PT Frequency  2x / week    PT Duration  8 weeks    PT Treatment/Interventions  ADLs/Self Care Home Management;Cryotherapy;Electrical Stimulation;Ultrasound;Gait training;Stair training;Functional mobility training;Therapeutic activities;Moist Heat;Therapeutic exercise;Balance training;Neuromuscular re-education;Patient/family education;Scar mobilization;Manual techniques;Passive range of motion;Vasopneumatic Device;Taping    PT Next Visit Plan  continued focus on ROM and functional mobility.     PT Home Exercise Plan  Access Code: [email or web address]    Consulted and Agree with Plan of Care  Patient       Patient will benefit from skilled therapeutic intervention in order to improve the following deficits and impairments:  Abnormal gait, Increased fascial restricitons, Pain, Increased muscle spasms, Decreased range of motion, Decreased strength, Impaired flexibility, Difficulty walking, Decreased balance  Visit Diagnosis: Acute pain of right knee  Stiffness of right knee, not elsewhere classified  Other abnormalities of gait and mobility     Problem List Patient Active Problem List   Diagnosis Date Noted  . Unilateral primary osteoarthritis, right knee 03/23/2018  . Status  post total knee replacement, right 03/23/2018  . Unilateral primary osteoarthritis, left  hip 09/13/2016  . Status post left hip replacement 09/13/2016      Laureen Abrahams, PT, DPT 05/11/18 11:03 AM     [facility] [place], [place], [address] Phone: [phone]   Fax:  [phone]  Name: Manuel Hunter MRN: [id number] Date of Birth: [birth year]

## 2018-05-14 ENCOUNTER — Encounter: Payer: Self-pay | Admitting: Physical Therapy

## 2018-05-17 ENCOUNTER — Ambulatory Visit (INDEPENDENT_AMBULATORY_CARE_PROVIDER_SITE_OTHER): Payer: Medicare HMO | Admitting: Physical Therapy

## 2018-05-17 ENCOUNTER — Encounter: Payer: Self-pay | Admitting: Physical Therapy

## 2018-05-17 DIAGNOSIS — M25561 Pain in right knee: Secondary | ICD-10-CM | POA: Diagnosis not present

## 2018-05-17 DIAGNOSIS — M25661 Stiffness of right knee, not elsewhere classified: Secondary | ICD-10-CM | POA: Diagnosis not present

## 2018-05-17 DIAGNOSIS — R2689 Other abnormalities of gait and mobility: Secondary | ICD-10-CM

## 2018-05-17 NOTE — Therapy (Signed)
[name], [place], [address] Phone: [phone]   Fax:  [phone]  Physical Therapy Treatment  Patient Details  Name: Manuel Hunter MRN: [id number] Date of Birth: 10-31-1940 Referring Provider (PT): [name], MD   Encounter Date: 05/17/2018  PT End of Session - 05/17/18 1408    Visit Number  7    Number of Visits  16    Date for PT Re-Evaluation  06/18/18    Authorization Type  [employer]    PT Start Time  8115    PT Stop Time  1501    PT Time Calculation (min)  59 min    Activity Tolerance  Patient tolerated treatment well;No increased pain    Behavior During Therapy  WFL for tasks assessed/performed       Past Medical History:  Diagnosis Date  . Arthritis   . Complication of anesthesia    States "I woke up to soon one time" with rotator cuff repair  . GERD (gastroesophageal reflux disease)   . Headache   . History of hiatal hernia   . Lower extremity edema    occasionally takes furosemide prn  . Prostate cancer ([place]) 2007   tx with surgery  . RLS (restless legs syndrome)   . Rotator cuff tear    right  . Sleep apnea    uses CPAP nightly    Past Surgical History:  Procedure Laterality Date  . FINGER ARTHROPLASTY Left 03/10/2017   Procedure: Left index finger metacarpal phalangeal arthroplasty;  Surgeon: Roseanne Kaufman, MD;  Location: [facility];  Service: Orthopedics;  Laterality: Left;  90 mins  . KNEE SURGERY Bilateral   . left knee replacment  2009  . MANDIBLE FRACTURE SURGERY     left 2 titanium plates and 10 scres  . NECK SURGERY     cervical with bone graft  . PROSTATE SURGERY     prostatctomy  . ROTATOR CUFF REPAIR     x 2 right x 2 left  . TOTAL HIP ARTHROPLASTY Left 09/13/2016   Procedure: LEFT TOTAL HIP ARTHROPLASTY ANTERIOR APPROACH;  Surgeon: [name], MD;  Location: [facility];  Service: Orthopedics;  Laterality: Left;  .  TOTAL KNEE ARTHROPLASTY Right 03/23/2018   Procedure: RIGHT TOTAL KNEE ARTHROPLASTY;  Surgeon: [name], MD;  Location[facility];  Service: Orthopedics;  Laterality: Right;    There were no vitals filed for this visit.  Subjective Assessment - 05/17/18 1409    Subjective  "Both my knees are bothering me today".  Pt reports he still is having difficulty sleeping due to not being able to keep comfortable.     Pertinent History  Lt TKA, bil RTC repair x 2, Lt THA (08/2017), sleep apnea, hx prostate cancer    Currently in Pain?  Yes    Pain Score  3     Pain Location  Knee    Pain Orientation  Right;Left    Pain Descriptors / Indicators  Aching    Aggravating Factors   lying down at night, sitting too long     Pain Relieving Factors  standing up and moving around.          [facility] PT Assessment - 05/17/18 0001      Assessment   Medical Diagnosis  Z96.651 (ICD-10-CM) - Status post total knee replacement, right    Referring Provider (PT)  [name], MD    Onset Date/Surgical  Date  04/22/18    Next MD Visit  to be scheduled       AROM   Right/Left Knee  Right    Right Knee Extension  8    Right Knee Flexion  115      Strength   Right Knee Flexion  5/5    Right Knee Extension  5/5    Left Knee Flexion  5/5    Left Knee Extension  5/5       [facility] Adult PT Treatment/Exercise - 05/17/18 0001      Exercises   Exercises  Knee/Hip      Knee/Hip Exercises: Stretches   Gastroc Stretch  Right;Left;30 seconds;3 reps      Knee/Hip Exercises: Aerobic   Recumbent Bike  partial to full forward revolutions x 8 min      Knee/Hip Exercises: Standing   Heel Raises  15 reps    Forward Step Up  Left;1 set;10 reps;Hand Hold: 1;Step Height: 6"    Step Down  Left;1 set;15 reps;Hand Hold: 2;Step Height: 6"   retro step up with RLE   Functional Squat  15 reps    SLS  Light UE support RLE x30 sec, 3 reps on compliant surface    Other Standing Knee Exercises  forward  lunge on 13" step for knee flex ROM, followed by hamstring stretch x 20 sec each direction x 5 reps    Other Standing Knee Exercises  tandem stance on 1/2 foam roll (flat side down) x 30 sec x 2 reps each leg forward.       Vasopneumatic   Number Minutes Vasopneumatic   15 minutes    Vasopnuematic Location   Knee    Vasopneumatic Pressure  High    Vasopneumatic Temperature   34 deg      Manual Therapy   Soft tissue mobilization  STM to Rt quad and hamstring to decrease fascial tightness and improve ROM.     Passive ROM  Rt knee flexion/extension    Kinesiotex  Edema      Kinesiotix   Edema  Big daddy tape applied in web pattern across Rt ant knee to decrease edema.                PT Short Term Goals - 05/11/18 1059      PT SHORT TERM GOAL #1   Title  independent with HEP    Status  On-going    Target Date  06/04/18      PT SHORT TERM GOAL #2   Title  Rt knee AROM improved 0-105 for improved function and mobility    Status  Partially Met      PT SHORT TERM GOAL #3   Title  amb > 150' with minimal gait deviations for improved mobility    Status  Achieved      PT SHORT TERM GOAL #4   Title  report 50% improvement in sleep for improved pain    Status  On-going        PT Long Term Goals - 05/17/18 1510      PT LONG TERM GOAL #1   Title  FOTO score improve to </= 36% limited for improved function    Status  On-going      PT LONG TERM GOAL #2   Title  Rt knee AROM improved to 0-110 for improved function    Status  Partially Met      PT LONG TERM GOAL #3  Title  negotiate 2 steps without hand rail reciprocally for improved function and mobility    Status  Achieved            Plan - 05/17/18 1447    Clinical Impression Statement  Pt demonstrated improved Rt knee flexion; continues with limited ext ROM.  Pt tolerated all exercises well without increase in pain.  Balance is improving slowly. Has partially met LTG #2 and met# 3. Progressing well towards  remaining goals.     Rehab Potential  Good    PT Frequency  2x / week    PT Duration  8 weeks    PT Treatment/Interventions  ADLs/Self Care Home Management;Cryotherapy;Electrical Stimulation;Ultrasound;Gait training;Stair training;Functional mobility training;Therapeutic activities;Moist Heat;Therapeutic exercise;Balance training;Neuromuscular re-education;Patient/family education;Scar mobilization;Manual techniques;Passive range of motion;Vasopneumatic Device;Taping    PT Next Visit Plan  continued focus on ROM and functional mobility.     Consulted and Agree with Plan of Care  Patient       Patient will benefit from skilled therapeutic intervention in order to improve the following deficits and impairments:  Abnormal gait, Increased fascial restricitons, Pain, Increased muscle spasms, Decreased range of motion, Decreased strength, Impaired flexibility, Difficulty walking, Decreased balance  Visit Diagnosis: Acute pain of right knee  Stiffness of right knee, not elsewhere classified  Other abnormalities of gait and mobility     Problem List Patient Active Problem List   Diagnosis Date Noted  . Unilateral primary osteoarthritis, right knee 03/23/2018  . Status post total knee replacement, right 03/23/2018  . 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 Status post left hip replacement 09/13/2016   Kerin Perna, PTA 05/17/18 3:12 PM  [facility] [place], [place], [address] Phone: [phone]   Fax:  [phone]  Name: ELDRA WORD MRN: [id number] Date of Birth: 1940-10-05

## 2018-05-21 ENCOUNTER — Encounter: Payer: Self-pay | Admitting: Physical Therapy

## 2018-05-21 ENCOUNTER — Ambulatory Visit (INDEPENDENT_AMBULATORY_CARE_PROVIDER_SITE_OTHER): Payer: Medicare HMO | Admitting: Physical Therapy

## 2018-05-21 DIAGNOSIS — M25661 Stiffness of right knee, not elsewhere classified: Secondary | ICD-10-CM

## 2018-05-21 DIAGNOSIS — R2689 Other abnormalities of gait and mobility: Secondary | ICD-10-CM | POA: Diagnosis not present

## 2018-05-21 DIAGNOSIS — M25561 Pain in right knee: Secondary | ICD-10-CM

## 2018-05-21 NOTE — Therapy (Signed)
[place], [place], [address] Phone: [phone]   Fax:  [phone]  Physical Therapy Treatment  Patient Details  Name: Manuel Hunter MRN: [id number] Date of Birth: 08-Jul-1941 Referring Provider (PT): [name], MD   Encounter Date: 05/21/2018  PT End of Session - 05/21/18 1108    Visit Number  8    Number of Visits  16    Date for PT Re-Evaluation  06/18/18    Authorization Type  [employer]    PT Start Time  6728    PT Stop Time  9791    PT Time Calculation (min)  54 min    Activity Tolerance  Patient tolerated treatment well;No increased pain    Behavior During Therapy  WFL for tasks assessed/performed       Past Medical History:  Diagnosis Date  . Arthritis   . Complication of anesthesia    States "I woke up to soon one time" with rotator cuff repair  . GERD (gastroesophageal reflux disease)   . Headache   . History of hiatal hernia   . Lower extremity edema    occasionally takes furosemide prn  . Prostate cancer ([place]) 2007   tx with surgery  . RLS (restless legs syndrome)   . Rotator cuff tear    right  . Sleep apnea    uses CPAP nightly    Past Surgical History:  Procedure Laterality Date  . FINGER ARTHROPLASTY Left 03/10/2017   Procedure: Left index finger metacarpal phalangeal arthroplasty;  Surgeon: Roseanne Kaufman, MD;  Location: [facility];  Service: Orthopedics;  Laterality: Left;  90 mins  . KNEE SURGERY Bilateral   . left knee replacment  2009  . MANDIBLE FRACTURE SURGERY     left 2 titanium plates and 10 scres  . NECK SURGERY     cervical with bone graft  . PROSTATE SURGERY     prostatctomy  . ROTATOR CUFF REPAIR     x 2 right x 2 left  . TOTAL HIP ARTHROPLASTY Left 09/13/2016   Procedure: LEFT TOTAL HIP ARTHROPLASTY ANTERIOR APPROACH;  Surgeon: [name], MD;  Location: [facility];  Service: Orthopedics;  Laterality: Left;  .  TOTAL KNEE ARTHROPLASTY Right 03/23/2018   Procedure: RIGHT TOTAL KNEE ARTHROPLASTY;  Surgeon: [name], MD;  [facility];  Service: Orthopedics;  Laterality: Right;    There were no vitals filed for this visit.  Subjective Assessment - 05/21/18 1109    Subjective  Pt reports he is having an easier time getting in and out from car.  Getting around is easier.  The only thing not improving is sleeping.      Patient Stated Goals  improve ROM, walk normal    Currently in Pain?  No/denies    Pain Score  0-No pain    Pain Location  Knee         [facility] PT Assessment - 05/21/18 0001      Assessment   Medical Diagnosis  Z96.651 (ICD-10-CM) - Status post total knee replacement, right    Referring Provider (PT)  [name], MD    Onset Date/Surgical Date  04/22/18      AROM   Right/Left Knee  Right    Right Knee Extension  10    Right Knee Flexion  112        [facility] Adult PT Treatment/Exercise - 05/21/18 0001  Exercises   Exercises  Knee/Hip      Knee/Hip Exercises: Stretches   Gastroc Stretch  Right;Left;30 seconds;3 reps      Knee/Hip Exercises: Aerobic   Recumbent Bike  full revolutions x 6 min (L1)    Other Aerobic  laps around gym in between exercises to decrease stiffness      Knee/Hip Exercises: Standing   Terminal Knee Extension  Right;1 set;10 reps    Theraband Level (Terminal Knee Extension)  Level 3 (Green)    Lateral Step Up  Right;1 set;Hand Hold: 1;10 reps;Step Height: 8"    Forward Step Up  1 set;10 reps;Hand Hold: 1;Step Height: 6";Step Height: 8";Right    Other Standing Knee Exercises  forward lunge on 13" step for knee flex ROM, followed by hamstring stretch x 20 sec each direction x 5 reps    Other Standing Knee Exercises  tandem stance on 1/2 foam roll (flat side up) x 30 sec x 2 reps each leg forward.   feet perpendicular on 1/2 foam roller, flat side up, with occasional UE to steady x 30 sec       Knee/Hip Exercises: Supine    Heel Slides  --   1 rep for measurement   Bridges  10 reps      Vasopneumatic   Number Minutes Vasopneumatic   15 minutes    Vasopnuematic Location   Knee    Vasopneumatic Pressure  High    Vasopneumatic Temperature   34 deg      Kinesiotix   Edema  squid shaped Reg Rock tape applied in web pattern across Rt ant knee to decrease edema.                PT Short Term Goals - 05/11/18 1059      PT SHORT TERM GOAL #1   Title  independent with HEP    Status  On-going    Target Date  06/04/18      PT SHORT TERM GOAL #2   Title  Rt knee AROM improved 0-105 for improved function and mobility    Status  Partially Met      PT SHORT TERM GOAL #3   Title  amb > 150' with minimal gait deviations for improved mobility    Status  Achieved      PT SHORT TERM GOAL #4   Title  report 50% improvement in sleep for improved pain    Status  On-going        PT Long Term Goals - 05/17/18 1510      PT LONG TERM GOAL #1   Title  FOTO score improve to </= 36% limited for improved function    Status  On-going      PT LONG TERM GOAL #2   Title  Rt knee AROM improved to 0-110 for improved function    Status  Partially Met      PT LONG TERM GOAL #3   Title  negotiate 2 steps without hand rail reciprocally for improved function and mobility    Status  Achieved            Plan - 05/21/18 1316    Clinical Impression Statement  Pt's Rt knee ROM slightly less than last visit, however he was able to make full revolutions on bike today.  He tolerated steps with increased height without difficulty.  Balance is slowly improving.  Progressing well towards remaining goals.     Rehab Potential  Good  PT Frequency  2x / week    PT Duration  8 weeks    PT Treatment/Interventions  ADLs/Self Care Home Management;Cryotherapy;Electrical Stimulation;Ultrasound;Gait training;Stair training;Functional mobility training;Therapeutic activities;Moist Heat;Therapeutic exercise;Balance  training;Neuromuscular re-education;Patient/family education;Scar mobilization;Manual techniques;Passive range of motion;Vasopneumatic Device;Taping    PT Next Visit Plan  continued focus on ROM and functional mobility.     Consulted and Agree with Plan of Care  Patient       Patient will benefit from skilled therapeutic intervention in order to improve the following deficits and impairments:  Abnormal gait, Increased fascial restricitons, Pain, Increased muscle spasms, Decreased range of motion, Decreased strength, Impaired flexibility, Difficulty walking, Decreased balance  Visit Diagnosis: Acute pain of right knee  Stiffness of right knee, not elsewhere classified  Other abnormalities of gait and mobility     Problem List Patient Active Problem List   Diagnosis Date Noted  . Unilateral primary osteoarthritis, right knee 03/23/2018  . Status post total knee replacement, right 03/23/2018  . 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 Status post left hip replacement 09/13/2016   Kerin Perna, PTA 05/21/18 1:17 PM  [place], [place], [address] Phone: [phone]   Fax:  [phone]  Name: MISHAEL HARAN MRN: [id number] Date of Birth: 06/13/1941

## 2018-05-25 ENCOUNTER — Encounter: Payer: Self-pay | Admitting: Physical Therapy

## 2018-05-25 ENCOUNTER — Ambulatory Visit (INDEPENDENT_AMBULATORY_CARE_PROVIDER_SITE_OTHER): Payer: Medicare HMO | Admitting: Physical Therapy

## 2018-05-25 DIAGNOSIS — M25561 Pain in right knee: Secondary | ICD-10-CM | POA: Diagnosis not present

## 2018-05-25 DIAGNOSIS — M25661 Stiffness of right knee, not elsewhere classified: Secondary | ICD-10-CM | POA: Diagnosis not present

## 2018-05-25 DIAGNOSIS — R2689 Other abnormalities of gait and mobility: Secondary | ICD-10-CM | POA: Diagnosis not present

## 2018-05-25 NOTE — Therapy (Signed)
[place], [place], [address] Phone: [phone]   Fax:  [phone]  Physical Therapy Treatment  Patient Details  Name: Manuel Hunter MRN: [id number] Date of Birth: 06/11/1941 Referring Provider (PT): [name], MD   Encounter Date: 05/25/2018  PT End of Session - 05/25/18 1518    Visit Number  9    Number of Visits  16    Date for PT Re-Evaluation  06/18/18    Authorization Type  [employer]    PT Start Time  7121    PT Stop Time  9758    PT Time Calculation (min)  56 min    Activity Tolerance  Patient tolerated treatment well;No increased pain    Behavior During Therapy  WFL for tasks assessed/performed       Past Medical History:  Diagnosis Date  . Arthritis   . Complication of anesthesia    States "I woke up to soon one time" with rotator cuff repair  . GERD (gastroesophageal reflux disease)   . Headache   . History of hiatal hernia   . Lower extremity edema    occasionally takes furosemide prn  . Prostate cancer ([place]) 2007   tx with surgery  . RLS (restless legs syndrome)   . Rotator cuff tear    right  . Sleep apnea    uses CPAP nightly    Past Surgical History:  Procedure Laterality Date  . FINGER ARTHROPLASTY Left 03/10/2017   Procedure: Left index finger metacarpal phalangeal arthroplasty;  Surgeon: Roseanne Kaufman, MD;  Location: [facility];  Service: Orthopedics;  Laterality: Left;  90 mins  . KNEE SURGERY Bilateral   . left knee replacment  2009  . MANDIBLE FRACTURE SURGERY     left 2 titanium plates and 10 scres  . NECK SURGERY     cervical with bone graft  . PROSTATE SURGERY     prostatctomy  . ROTATOR CUFF REPAIR     x 2 right x 2 left  . TOTAL HIP ARTHROPLASTY Left 09/13/2016   Procedure: LEFT TOTAL HIP ARTHROPLASTY ANTERIOR APPROACH;  Surgeon: [name], MD;  Location: [facility];  Service: Orthopedics;  Laterality: Left;  .  TOTAL KNEE ARTHROPLASTY Right 03/23/2018   Procedure: RIGHT TOTAL KNEE ARTHROPLASTY;  Surgeon: [name], MD;  [facility];  Service: Orthopedics;  Laterality: Right;    There were no vitals filed for this visit.  Subjective Assessment - 05/25/18 1448    Subjective  knee is painful today; still having trouble sleeping.  has been doing a lot of manual activity which may have caused increased pain.    Patient Stated Goals  improve ROM, walk normal    Pain Score  6     Pain Location  Knee    Pain Orientation  Right    Pain Descriptors / Indicators  Aching    Pain Type  Surgical pain    Pain Onset  More than a month ago    Pain Frequency  Intermittent    Aggravating Factors   lying down at night, sitting too long    Pain Relieving Factors  standing up and moving around         [facility] PT Assessment - 05/25/18 1459      Assessment   Medical Diagnosis  Z96.651 (ICD-10-CM) - Status post total knee replacement, right    Referring Provider (PT)  [name], MD  Onset Date/Surgical Date  04/22/18      Observation/Other Assessments   Focus on Therapeutic Outcomes (FOTO)   55 (45% limited)                   [facility] Adult PT Treatment/Exercise - 05/25/18 1450      Knee/Hip Exercises: Stretches   Passive Hamstring Stretch  Right;3 reps;30 seconds   seated   Gastroc Stretch  Right;Left;30 seconds;3 reps    Other Knee/Hip Stretches  standing knee flexion into step 3x30 sec      Knee/Hip Exercises: Aerobic   Recumbent Bike  full revolutions x 6 min (L1)      Knee/Hip Exercises: Standing   Lateral Step Up  Right;1 set;Hand Hold: 1;10 reps;Step Height: 8"    Forward Step Up  1 set;10 reps;Hand Hold: 1;Step Height: 8";Right    Other Standing Knee Exercises  sidestepping and backwards walking with blue theraband 20' x 2 each way    Other Standing Knee Exercises  laps around gym between exercises for improved gait mechanics      Vasopneumatic    Number Minutes Vasopneumatic   15 minutes    Vasopnuematic Location   Knee    Vasopneumatic Pressure  High    Vasopneumatic Temperature   34 deg      Manual Therapy   Kinesiotex  Edema      Kinesiotix   Edema  squid shaped Reg Rock tape applied in web pattern across Rt ant knee to decrease edema.                PT Short Term Goals - 05/11/18 1059      PT SHORT TERM GOAL #1   Title  independent with HEP    Status  On-going    Target Date  06/04/18      PT SHORT TERM GOAL #2   Title  Rt knee AROM improved 0-105 for improved function and mobility    Status  Partially Met      PT SHORT TERM GOAL #3   Title  amb > 150' with minimal gait deviations for improved mobility    Status  Achieved      PT SHORT TERM GOAL #4   Title  report 50% improvement in sleep for improved pain    Status  On-going        PT Long Term Goals - 05/17/18 1510      PT LONG TERM GOAL #1   Title  FOTO score improve to </= 36% limited for improved function    Status  On-going      PT LONG TERM GOAL #2   Title  Rt knee AROM improved to 0-110 for improved function    Status  Partially Met      PT LONG TERM GOAL #3   Title  negotiate 2 steps without hand rail reciprocally for improved function and mobility    Status  Achieved            Plan - 05/25/18 1518    Clinical Impression Statement  Pt overall doing well with PT, despite FOTO score decreased 4%.  Pt reports subjective functional improvement, and only main complaint is pain at night when sleeping.  Pt with increased pain on bike today but no increase in pain with other activities.  Recommend continued PT 1-2 weeks at this time.    Rehab Potential  Good    PT Frequency  2x / week  PT Duration  8 weeks    PT Treatment/Interventions  ADLs/Self Care Home Management;Cryotherapy;Electrical Stimulation;Ultrasound;Gait training;Stair training;Functional mobility training;Therapeutic activities;Moist Heat;Therapeutic exercise;Balance  training;Neuromuscular re-education;Patient/family education;Scar mobilization;Manual techniques;Passive range of motion;Vasopneumatic Device;Taping    PT Next Visit Plan  continued focus on ROM and functional mobility.     PT Home Exercise Plan  Access Code: [email or web address]    Consulted and Agree with Plan of Care  Patient       Patient will benefit from skilled therapeutic intervention in order to improve the following deficits and impairments:  Abnormal gait, Increased fascial restricitons, Pain, Increased muscle spasms, Decreased range of motion, Decreased strength, Impaired flexibility, Difficulty walking, Decreased balance  Visit Diagnosis: Acute pain of right knee  Stiffness of right knee, not elsewhere classified  Other abnormalities of gait and mobility     Problem List Patient Active Problem List   Diagnosis Date Noted  . Unilateral primary osteoarthritis, right knee 03/23/2018  . Status post total knee replacement, right 03/23/2018  . 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 Status post left hip replacement 09/13/2016      Laureen Abrahams, PT, DPT 05/25/18 3:25 PM    [facility] [place], [place], [address] Phone: [phone]   Fax:  [phone]  Name: Manuel Hunter MRN: [id number] Date of Birth: 07/17/1941

## 2018-05-28 ENCOUNTER — Encounter: Payer: Medicare HMO | Admitting: Physical Therapy

## 2018-06-01 ENCOUNTER — Ambulatory Visit (INDEPENDENT_AMBULATORY_CARE_PROVIDER_SITE_OTHER): Payer: Medicare HMO | Admitting: Physical Therapy

## 2018-06-01 ENCOUNTER — Encounter: Payer: Self-pay | Admitting: Physical Therapy

## 2018-06-01 DIAGNOSIS — M25661 Stiffness of right knee, not elsewhere classified: Secondary | ICD-10-CM

## 2018-06-01 DIAGNOSIS — R2689 Other abnormalities of gait and mobility: Secondary | ICD-10-CM

## 2018-06-01 DIAGNOSIS — M25561 Pain in right knee: Secondary | ICD-10-CM

## 2018-06-01 NOTE — Therapy (Addendum)
[place], [place], [address] Phone: [phone]   Fax:  [phone]  Physical Therapy Treatment  Patient Details  Name: Manuel Hunter MRN: [id number] Date of Birth: 1940/10/18 Referring Provider (PT): [name], MD    Progress Note Reporting Period 04/23/18 to 06/01/18  See note below for Objective Data and Assessment of Progress/Goals.      Encounter Date: 06/01/2018  PT End of Session - 06/01/18 1400    Visit Number  10    Number of Visits  16    Date for PT Re-Evaluation  06/18/18    Authorization Type  [employer]    PT Start Time  5974    PT Stop Time  1451    PT Time Calculation (min)  53 min    Activity Tolerance  Patient tolerated treatment well;Patient limited by pain    Behavior During Therapy  [facility] for tasks assessed/performed       Past Medical History:  Diagnosis Date  . Arthritis   . Complication of anesthesia    States "I woke up to soon one time" with rotator cuff repair  . GERD (gastroesophageal reflux disease)   . Headache   . History of hiatal hernia   . Lower extremity edema    occasionally takes furosemide prn  . Prostate cancer ([place]) 2007   tx with surgery  . RLS (restless legs syndrome)   . Rotator cuff tear    right  . Sleep apnea    uses CPAP nightly    Past Surgical History:  Procedure Laterality Date  . FINGER ARTHROPLASTY Left 03/10/2017   Procedure: Left index finger metacarpal phalangeal arthroplasty;  Surgeon: Roseanne Kaufman, MD;  Location: [place];  Service: Orthopedics;  Laterality: Left;  90 mins  . KNEE SURGERY Bilateral   . left knee replacment  2009  . MANDIBLE FRACTURE SURGERY     left 2 titanium plates and 10 scres  . NECK SURGERY     cervical with bone graft  . PROSTATE SURGERY     prostatctomy  . ROTATOR CUFF REPAIR     x 2 right x 2 left  . TOTAL HIP ARTHROPLASTY Left 09/13/2016   Procedure: LEFT  TOTAL HIP ARTHROPLASTY ANTERIOR APPROACH;  Surgeon: [name], MD;  Location: [facility];  Service: Orthopedics;  Laterality: Left;  . TOTAL KNEE ARTHROPLASTY Right 03/23/2018   Procedure: RIGHT TOTAL KNEE ARTHROPLASTY;  Surgeon: [name], MD;  [facility];  Service: Orthopedics;  Laterality: Right;    There were no vitals filed for this visit.  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  He is frustrated that he still has swelling and pain in his Rt knee, and is unable to sleep due to knee pain.     Patient Stated Goals  improve ROM, walk normal    Currently in Pain?  Yes    Pain Score  3     Pain Location  Knee    Pain Orientation  Right    Pain Descriptors / Indicators  Aching    Aggravating Factors   lying down at night, sitting too long    Pain Relieving Factors  standing up and moving around  [facility] PT Assessment - 06/01/18 0001      Assessment   Medical Diagnosis  Z96.651 (ICD-10-CM) - Status post total knee replacement, right    Referring Provider (PT)  [name], MD    Onset Date/Surgical Date  04/22/18    Next MD Visit  06/06/18      AROM   Right Knee Extension  9    Right Knee Flexion  116    Left Knee Extension  7      Flexibility   Soft Tissue Assessment /Muscle Length  yes    Quadriceps  Rt 90 deg, prone with quad stretch       [facility] Adult PT Treatment/Exercise - 06/01/18 0001      Exercises   Exercises  Knee/Hip      Knee/Hip Exercises: Stretches   Passive Hamstring Stretch  Right;3 reps;30 seconds   seated   Quad Stretch  Right;4 reps;30 seconds   prone with strap   Gastroc Stretch  Right;Left;30 seconds;3 reps    Other Knee/Hip Stretches  standing knee flexion into step 3x30 sec      Knee/Hip Exercises: Aerobic    Recumbent Bike  full revolutions x 3 min (L1)- stopped due to sharp pain in knee      Knee/Hip Exercises: Supine   Bridges  10 reps    Bridges Limitations  stopped after 2nd rep due to pain. able to resume after short rest break.       Knee/Hip Exercises: Prone   Hamstring Curl  --   3 reps, in between prone hang reps   Prone Knee Hang  1 minute   3 reps      Vasopneumatic   Number Minutes Vasopneumatic   15 minutes    Vasopnuematic Location   Knee    Vasopneumatic Pressure  High    Vasopneumatic Temperature   34 deg      Manual Therapy   Soft tissue mobilization  STM to Rt hamstrings and hamstring to decrease fascial tightness and improve ROM.     Passive ROM  overpressure into Rt knee ext, multiple reps     Kinesiotex  Edema      Kinesiotix   Edema  squid shaped [place] tape applied in web pattern across Rt ant knee to decrease edema.                PT Short Term Goals - 05/11/18 1059      PT SHORT TERM GOAL #1   Title  independent with HEP    Status  On-going    Target Date  06/04/18      PT SHORT TERM GOAL #2   Title  Rt knee AROM improved 0-105 for improved function and mobility    Status  Partially Met      PT SHORT TERM GOAL #3   Title  amb > 150' with minimal gait deviations for improved mobility    Status  Achieved      PT SHORT TERM GOAL #4   Title  report 50% improvement in sleep for improved pain    Status  On-going        PT Long Term Goals - 05/17/18 1510      PT LONG TERM GOAL #1   Title  FOTO score improve to </= 36% limited for improved function    Status  On-going      PT LONG TERM GOAL #2   Title  Rt knee  AROM improved to 0-110 for improved function    Status  Partially Met      PT LONG TERM GOAL #3   Title  negotiate 2 steps without hand rail reciprocally for improved function and mobility    Status  Achieved            Plan - 06/01/18 1448    Clinical Impression Statement  Pt's Rt knee ROM slightly  improved from last visit.  Pt has some pitting edema visible in his Rt lower leg.  Pt has reported being up on his legs over last few days.  Educated pt on importance of ice, elevation and compression to assist with decreasing edema after prolonged standing/activity.  Pt had difficulty tolerating exercises today, reporting sharp shooting pains in knee.  Pt will benefit from continued PT intervention to max functional mobility and safety.  Goals ongoing as some STGs and LTGs are partially met or achieved, with other goals still ongoing at this time.  Pain continues to be main factor but only at night with sleeping.   Rehab Potential  Good    PT Frequency  2x / week    PT Duration  8 weeks    PT Treatment/Interventions  ADLs/Self Care Home Management;Cryotherapy;Electrical Stimulation;Ultrasound;Gait training;Stair training;Functional mobility training;Therapeutic activities;Moist Heat;Therapeutic exercise;Balance training;Neuromuscular re-education;Patient/family education;Scar mobilization;Manual techniques;Passive range of motion;Vasopneumatic Device;Taping    PT Next Visit Plan  continued focus on ROM and functional mobility.     Consulted and Agree with Plan of Care  Patient       Patient will benefit from skilled therapeutic intervention in order to improve the following deficits and impairments:  Abnormal gait, Increased fascial restricitons, Pain, Increased muscle spasms, Decreased range of motion, Decreased strength, Impaired flexibility, Difficulty walking, Decreased balance  Visit Diagnosis: Acute pain of right knee  Stiffness of right knee, not elsewhere classified  Other abnormalities of gait and mobility     Problem List Patient Active Problem List   Diagnosis Date Noted  . Unilateral primary osteoarthritis, right knee 03/23/2018  . Status post total knee replacement, right 03/23/2018  . 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 Status post left hip replacement  09/13/2016   Kerin Perna, PTA 06/01/18 3:06 PM    Laureen Abrahams, PT, DPT 06/04/18 7:45 AM     [facility] [place], [place], [address] Phone: [phone]   Fax:  [phone]  Name: Manuel Hunter MRN: [id number] Date of Birth: 01/05/1941

## 2018-06-06 ENCOUNTER — Encounter (INDEPENDENT_AMBULATORY_CARE_PROVIDER_SITE_OTHER): Payer: Self-pay | Admitting: Orthopaedic Surgery

## 2018-06-06 ENCOUNTER — Ambulatory Visit (INDEPENDENT_AMBULATORY_CARE_PROVIDER_SITE_OTHER): Payer: 59 | Admitting: Orthopaedic Surgery

## 2018-06-06 ENCOUNTER — Ambulatory Visit (INDEPENDENT_AMBULATORY_CARE_PROVIDER_SITE_OTHER): Payer: Medicare HMO | Admitting: Orthopaedic Surgery

## 2018-06-06 DIAGNOSIS — Z96651 Presence of right artificial knee joint: Secondary | ICD-10-CM

## 2018-06-06 MED ORDER — METHOCARBAMOL 500 MG PO TABS: 500.0000 mg | ORAL_TABLET | Freq: Four times a day (QID) | ORAL | 0 refills | Status: DC | PRN

## 2018-06-06 MED ORDER — OXYCODONE HCL 5 MG PO TABS: 5.0000 mg | ORAL_TABLET | ORAL | 0 refills | Status: DC | PRN

## 2018-06-06 NOTE — Progress Notes (Signed)
The patient is now 10 weeks status post a right total knee arthroplasty.  He has been very compliant with physical therapy and working hard to get his knee moving.  He still having knee swelling and pain but is made progress the time of seeing him.  On exam he still lacks full extension by about 5 degrees when I push him hard to get him to last.  There is still swelling in his knee and calf but it is much less.  His flexion is excellent.  The knee feels loosely stable.  At this point we will continue his pain medication and muscle relaxants and pushing hard to therapy.  He has had a knee replacement before the left side so he knows it takes a while to push through this.  I do think he is making excellent progress and I gave encouragement that he is doing well.  We will see him back in 3 months with an AP and lateral of his right knee.

## 2018-06-08 ENCOUNTER — Ambulatory Visit (INDEPENDENT_AMBULATORY_CARE_PROVIDER_SITE_OTHER): Payer: 59 | Admitting: Physical Therapy

## 2018-06-08 DIAGNOSIS — M25561 Pain in right knee: Secondary | ICD-10-CM | POA: Diagnosis not present

## 2018-06-08 DIAGNOSIS — R2689 Other abnormalities of gait and mobility: Secondary | ICD-10-CM

## 2018-06-08 DIAGNOSIS — M25661 Stiffness of right knee, not elsewhere classified: Secondary | ICD-10-CM

## 2018-06-08 NOTE — Therapy (Addendum)
[place], [place], [address] Phone: [phone]   Fax:  [phone]  Physical Therapy Treatment/Discharge  Patient Details  Name: Manuel Hunter MRN: [id number] Date of Birth: [birth year] Referring Provider (PT): [name], MD   Encounter Date: 06/08/2018  PT End of Session - 06/08/18 1454    Visit Number  11    Number of Visits  16    Date for PT Re-Evaluation  06/18/18    Authorization Type  [employer]    PT Start Time  1446    PT Stop Time  1535    PT Time Calculation (min)  49 min    Activity Tolerance  Patient tolerated treatment well    Behavior During Therapy  [facility] for tasks assessed/performed       Past Medical History:  Diagnosis Date  . Arthritis   . Complication of anesthesia    States "I woke up to soon one time" with rotator cuff repair  . GERD (gastroesophageal reflux disease)   . Headache   . History of hiatal hernia   . Lower extremity edema    occasionally takes furosemide prn  . Prostate cancer ([place]) 2007   tx with surgery  . RLS (restless legs syndrome)   . Rotator cuff tear    right  . Sleep apnea    uses CPAP nightly    Past Surgical History:  Procedure Laterality Date  . FINGER ARTHROPLASTY Left 03/10/2017   Procedure: Left index finger metacarpal phalangeal arthroplasty;  Surgeon: Roseanne Kaufman, MD;  Location: [facility];  Service: Orthopedics;  Laterality: Left;  90 mins  . KNEE SURGERY Bilateral   . left knee replacment  2009  . MANDIBLE FRACTURE SURGERY     left 2 titanium plates and 10 scres  . NECK SURGERY     cervical with bone graft  . PROSTATE SURGERY     prostatctomy  . ROTATOR CUFF REPAIR     x 2 right x 2 left  . TOTAL HIP ARTHROPLASTY Left 09/13/2016   Procedure: LEFT TOTAL HIP ARTHROPLASTY ANTERIOR APPROACH;  Surgeon: [name], MD;  Location: [facility];  Service: Orthopedics;  Laterality: Left;  . TOTAL  KNEE ARTHROPLASTY Right 03/23/2018   Procedure: RIGHT TOTAL KNEE ARTHROPLASTY;  Surgeon: [name], MD;  [facility];  Service: Orthopedics;  Laterality: Right;    There were no vitals filed for this visit.  Subjective Assessment - 06/08/18 1450    Subjective  Pt reports his back has continued to bother him; unsure why.  He continues to have difficulty sleeping.  Swelling in knee has improved. Pt verbalized readiness to d/c to HEP soon.    Patient Stated Goals  improve ROM, walk normal    Currently in Pain?  Yes    Pain Score  3     Pain Location  Back    Pain Orientation  Left;Right;Lower    Aggravating Factors   lying down at night     Pain Relieving Factors  sitting supported.          [facility] PT Assessment - 06/08/18 0001      Assessment   Medical Diagnosis  Z96.651 (ICD-10-CM) - Status post total knee replacement, right    Referring Provider (PT)  [name], MD    Onset Date/Surgical Date  04/22/18    Next MD Visit  06/06/18      Observation/Other Assessments  Focus on Therapeutic Outcomes (FOTO)   10% limited.       AROM   Right Knee Flexion  118    Left Knee Extension  7        [facility] Adult PT Treatment/Exercise - 06/08/18 0001      Knee/Hip Exercises: Stretches   Passive Hamstring Stretch  Right;3 reps;30 seconds   seated   Quad Stretch  Right;4 reps;30 seconds   prone with strap   Gastroc Stretch  Right;Left;30 seconds;3 reps      Knee/Hip Exercises: Aerobic   Recumbent Bike  L1: (full revolutions) x 6 min    PTA present to monitor and discuss progress     Knee/Hip Exercises: Standing   Other Standing Knee Exercises  wall slide with 5 sec hold x 10 reps     Other Standing Knee Exercises  laps around gym between exercises for improved gait mechanics      Knee/Hip Exercises: Prone   Prone Knee Hang  1 minute   3 reps      Modalities   Modalities  Moist Heat;Vasopneumatic      Moist Heat Therapy   Number Minutes Moist  Heat  15 Minutes    Moist Heat Location  Lumbar Spine   during vaso     Vasopneumatic   Number Minutes Vasopneumatic   15 minutes    Vasopnuematic Location   Knee    Vasopneumatic Pressure  High    Vasopneumatic Temperature   34 deg      Manual Therapy   Soft tissue mobilization  STM to Rt hamstrings and hamstring to decrease fascial tightness and improve ROM.     Passive ROM  overpressure into Rt knee ext, multiple reps       Kinesiotix   Edema  --   unable to apply due to vasoline on knee            PT Education - 06/08/18 1524    Education Details  HEP - self massage     Person(s) Educated  Patient    Methods  Explanation;Handout;Demonstration;Verbal cues    Comprehension  Verbalized understanding;Returned demonstration       PT Short Term Goals - 05/11/18 1059      PT SHORT TERM GOAL #1   Title  independent with HEP    Status  On-going    Target Date  06/04/18      PT SHORT TERM GOAL #2   Title  Rt knee AROM improved 0-105 for improved function and mobility    Status  Partially Met      PT SHORT TERM GOAL #3   Title  amb > 150' with minimal gait deviations for improved mobility    Status  Achieved      PT SHORT TERM GOAL #4   Title  report 50% improvement in sleep for improved pain    Status  On-going        PT Long Term Goals - 06/08/18 1525      PT LONG TERM GOAL #1   Title  FOTO score improve to </= 36% limited for improved function    Baseline  10% limited 06/08/18    Status  Achieved      PT LONG TERM GOAL #2   Title  Rt knee AROM improved to 0-110 for improved function    Status  Partially Met      PT LONG TERM GOAL #3   Title  negotiate 2 steps without  hand rail reciprocally for improved function and mobility    Status  Achieved      PT LONG TERM GOAL #4   Title  report 75% improvement in sleep for improved function    Status  On-going            Plan - 06/08/18 1531    Clinical Impression Statement  Pt's Rt knee ROM  slightly better than last session.  Edema has improved.  He tolerated exercises well, including new wall slide exercise added to HEP.  Pt has partially met his goals and is pleased with current level of function. Pt verbalized desire to hold therapy for 2 wks while he works on [employer].      Rehab Potential  Good    PT Frequency  2x / week    PT Duration  8 weeks    PT Treatment/Interventions  ADLs/Self Care Home Management;Cryotherapy;Electrical Stimulation;Ultrasound;Gait training;Stair training;Functional mobility training;Therapeutic activities;Moist Heat;Therapeutic exercise;Balance training;Neuromuscular re-education;Patient/family education;Scar mobilization;Manual techniques;Passive range of motion;Vasopneumatic Device;Taping    PT Next Visit Plan  Spoke to supervising PT; will hold therapy per pt request until 12/6.    Consulted and Agree with Plan of Care  Patient       Patient will benefit from skilled therapeutic intervention in order to improve the following deficits and impairments:  Abnormal gait, Increased fascial restricitons, Pain, Increased muscle spasms, Decreased range of motion, Decreased strength, Impaired flexibility, Difficulty walking, Decreased balance  Visit Diagnosis: Acute pain of right knee  Stiffness of right knee, not elsewhere classified  Other abnormalities of gait and mobility     Problem List Patient Active Problem List   Diagnosis Date Noted  . Unilateral primary osteoarthritis, right knee 03/23/2018  . Status post total knee replacement, right 03/23/2018  . 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 Status post left hip replacement 09/13/2016   Kerin Perna, PTA 06/08/18 4:42 PM  [facility] [place], [place], [address] Phone: [phone]   Fax:  [phone]  Name: BENJI POYNTER MRN: [id number] Date of Birth: May 25, 1941      PHYSICAL THERAPY  DISCHARGE SUMMARY  Visits from Start of Care: 11  Current functional level related to goals / functional outcomes: See above   Remaining deficits: See above   Education / Equipment: HEP  Plan: Patient agrees to discharge.  Patient goals were partially met. Patient is being discharged due to being pleased with the current functional level.  ?????    Laureen Abrahams, PT, DPT 06/25/18 3:35 PM  [facility] [place], [place] [address]  [phone] (office) [phone] (fax)

## 2018-06-08 NOTE — Patient Instructions (Signed)
Strengthening: Wall Slide    Leaning on wall, slowly lower buttocks until thighs are parallel to floor. Hold __3-5__ seconds. Tighten thigh muscles and return. Repeat _10___ times per set. Do __1__ sets per session. Do _3___ sessions per week.

## 2018-09-05 ENCOUNTER — Ambulatory Visit (INDEPENDENT_AMBULATORY_CARE_PROVIDER_SITE_OTHER): Payer: Medicare HMO | Admitting: Orthopaedic Surgery

## 2018-09-05 ENCOUNTER — Encounter (INDEPENDENT_AMBULATORY_CARE_PROVIDER_SITE_OTHER): Payer: Self-pay | Admitting: Orthopaedic Surgery

## 2018-09-05 ENCOUNTER — Ambulatory Visit (INDEPENDENT_AMBULATORY_CARE_PROVIDER_SITE_OTHER): Payer: Medicare HMO

## 2018-09-05 ENCOUNTER — Ambulatory Visit (INDEPENDENT_AMBULATORY_CARE_PROVIDER_SITE_OTHER): Payer: 59 | Admitting: Orthopaedic Surgery

## 2018-09-05 DIAGNOSIS — Z96651 Presence of right artificial knee joint: Secondary | ICD-10-CM | POA: Diagnosis not present

## 2018-09-05 NOTE — Progress Notes (Signed)
Manuel Hunter is now 5 and half months out from a right total knee arthroplasty.  He said he still has leg pain at night and still abundant leg swelling that does not go down at night.  We did send him for Doppler soon after surgery which was negative for DVT.  On exam he still has abundant swelling in that right lower extremity which is concerning.  His calf is soft but his ankle and foot are swollen as well as his leg.  His knee looks good overall.  He lacks full extension by maybe just a few degrees and his flexion is excellent and the knee feels ligamentously stable.  His hip exam is normal as well.  X-rays of his right knee show well-seated implant with no complicating features.  Given the continued swelling in his leg I would like to send him for repeat Doppler ultrasound just to assess the blood flow and to rule out DVT for certain.  This does not need to be urgent.  All question concerns were answered and addressed.  We will give him a call with the DVT scan results.  If there is no other issues I do not need to see him back for 6 months.  We will have an AP and lateral of his right knee at that visit.  If there is any issues before then he will let us know.

## 2018-09-06 ENCOUNTER — Other Ambulatory Visit (INDEPENDENT_AMBULATORY_CARE_PROVIDER_SITE_OTHER): Payer: Self-pay

## 2018-09-06 DIAGNOSIS — M7989 Other specified soft tissue disorders: Secondary | ICD-10-CM

## 2018-09-18 ENCOUNTER — Ambulatory Visit (HOSPITAL_BASED_OUTPATIENT_CLINIC_OR_DEPARTMENT_OTHER)
Admission: RE | Admit: 2018-09-18 | Discharge: 2018-09-18 | Disposition: A | Discharge status: Home / Self Care | Payer: Medicare HMO | Source: Ambulatory Visit | Attending: Orthopaedic Surgery | Admitting: Orthopaedic Surgery

## 2018-09-18 DIAGNOSIS — M7989 Other specified soft tissue disorders: Secondary | ICD-10-CM | POA: Diagnosis not present

## 2018-09-18 NOTE — Progress Notes (Signed)
Bilateral Lower Extremity Venous Duplex   Right: There is no evidence of deep vein thrombosis in the lower extremity. No cystic structure found in the popliteal fossa.   Left: There is no evidence of deep vein thrombosis in the lower extremity. No cystic structure found in the popliteal fossa.     09/18/18  Cardell Peach RDCS, RVT

## 2019-03-06 ENCOUNTER — Ambulatory Visit (INDEPENDENT_AMBULATORY_CARE_PROVIDER_SITE_OTHER): Payer: Medicare HMO | Admitting: Orthopaedic Surgery

## 2019-03-06 ENCOUNTER — Encounter: Payer: Self-pay | Admitting: Orthopaedic Surgery

## 2019-03-06 ENCOUNTER — Ambulatory Visit (INDEPENDENT_AMBULATORY_CARE_PROVIDER_SITE_OTHER): Payer: Medicare HMO

## 2019-03-06 DIAGNOSIS — M25461 Effusion, right knee: Secondary | ICD-10-CM | POA: Diagnosis not present

## 2019-03-06 DIAGNOSIS — Z96651 Presence of right artificial knee joint: Secondary | ICD-10-CM

## 2019-03-06 DIAGNOSIS — M25561 Pain in right knee: Secondary | ICD-10-CM | POA: Diagnosis not present

## 2019-03-06 MED ORDER — LIDOCAINE HCL 1 % IJ SOLN
3.0000 mL | INTRAMUSCULAR | Status: AC | PRN
  Administered 2019-03-06: 3 mL

## 2019-03-06 MED ORDER — METHYLPREDNISOLONE ACETATE 40 MG/ML IJ SUSP
40.0000 mg | INTRAMUSCULAR | Status: AC | PRN
  Administered 2019-03-06: 40 mg via INTRA_ARTICULAR

## 2019-03-06 MED ORDER — OXYCODONE HCL 5 MG PO TABS: 5.0000 mg | ORAL_TABLET | Freq: Four times a day (QID) | ORAL | 0 refills | Status: DC | PRN

## 2019-03-06 NOTE — Progress Notes (Signed)
Office Visit Note   Patient: Manuel Hunter           Date of Birth: [birth year]           MRN: [id number] Visit Date: 03/06/2019              Requested by: Reita Cliche, MD No address on file PCP: Reita Cliche, MD   Assessment & Plan: Visit Diagnoses:  1. History of total right knee replacement   2. Status post total knee replacement, right     Plan: I did counsel him about placing steroid in his knee and I think this is reasonable.  I will send in a short course of oxycodone.  I would like to see him back in just 1 month for repeat examination of his right knee.  Follow-Up Instructions: Return in about 4 weeks (around 04/03/2019).   Orders:  Orders Placed This Encounter  Procedures  . XR Knee 1-2 Views Right   Meds ordered this encounter  Medications  . oxyCODONE (ROXICODONE) 5 MG immediate release tablet    Sig: Take 1 tablet (5 mg total) by mouth every 6 (six) hours as needed for severe pain.    Dispense:  30 tablet    Refill:  0      Procedures: Large Joint Inj: R knee on 03/06/2019 2:37 PM Indications: diagnostic evaluation and pain Details: 22 G 1.5 in needle, superolateral approach  Arthrogram: No  Medications: 3 mL lidocaine 1 %; 40 mg methylPREDNISolone acetate 40 MG/ML Outcome: tolerated well, no immediate complications Procedure, treatment alternatives, risks and benefits explained, specific risks discussed. Consent was given by the patient. Immediately prior to procedure a time out was called to verify the correct patient, procedure, equipment, support staff and site/side marked as required. Patient was prepped and draped in the usual sterile fashion.       Clinical Data: No additional findings.   Subjective: Chief Complaint  Patient presents with  . Right Knee - Follow-up  The patient is now 11 months status post a press-fit right total knee arthroplasty.  He is still having problems with pain at night and pain in general with that  knee.  He has had a previous left knee replacement that he had about 12 years ago and we replaced his left hip several years ago.  Does have no issues.  He is a prediabetic no medications for this now.  He is also on cholesterol medications.  He is an active 78 year old well-known to me. HPI  Review of Systems He currently denies any headache, chest pain, shortness of breath, fever, chills, nausea, vomiting  Objective: Vital Signs: There were no vitals taken for this visit.  Physical Exam He is alert and orient x3 and in no acute distress Ortho Exam Examination of his right operative knee does show a mild effusion.  I was able to aspirate 25 cc of fluid off of the knee that was clear.  I did decide to place steroid injection in his knee.  When he lay supine his extension is almost full and his flexion is to 120 degrees.  The knee feels ligamentously stable. Specialty Comments:  No specialty comments available.  Imaging: Xr Knee 1-2 Views Right  Result Date: 03/06/2019 2 views of the right knee shows a total knee arthroplasty with no complicating features.    PMFS History: Patient Active Problem List   Diagnosis Date Noted  . Unilateral primary osteoarthritis, right knee 03/23/2018  . Status post total  knee replacement, right 03/23/2018  . 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 Status post left hip replacement 09/13/2016   Past Medical History:  Diagnosis Date  . Arthritis   . Complication of anesthesia    States "I woke up to soon one time" with rotator cuff repair  . GERD (gastroesophageal reflux disease)   . Headache   . History of hiatal hernia   . Lower extremity edema    occasionally takes furosemide prn  . Prostate cancer ([place]) 2007   tx with surgery  . RLS (restless legs syndrome)   . Rotator cuff tear    right  . Sleep apnea    uses CPAP nightly    History reviewed. No pertinent family history.  Past Surgical History:  Procedure Laterality  Date  . FINGER ARTHROPLASTY Left 03/10/2017   Procedure: Left index finger metacarpal phalangeal arthroplasty;  Surgeon: Roseanne Kaufman, MD;  Location: [facility];  Service: Orthopedics;  Laterality: Left;  90 mins  . KNEE SURGERY Bilateral   . left knee replacment  2009  . MANDIBLE FRACTURE SURGERY     left 2 titanium plates and 10 scres  . NECK SURGERY     cervical with bone graft  . PROSTATE SURGERY     prostatctomy  . ROTATOR CUFF REPAIR     x 2 right x 2 left  . TOTAL HIP ARTHROPLASTY Left 09/13/2016   Procedure: LEFT TOTAL HIP ARTHROPLASTY ANTERIOR APPROACH;  Surgeon: [name], MD;  Location: [facility];  Service: Orthopedics;  Laterality: Left;  . TOTAL KNEE ARTHROPLASTY Right 03/23/2018   Procedure: RIGHT TOTAL KNEE ARTHROPLASTY;  Surgeon: [name], MD;  [facility];  Service: Orthopedics;  Laterality: Right;   Social History   Occupational History  . Not on file  Tobacco Use  . Smoking status: Never Smoker  . Smokeless tobacco: Never Used  Substance and Sexual Activity  . Alcohol use: Never    Frequency: Never  . Drug use: Never  . Sexual activity: Not on file

## 2019-04-03 ENCOUNTER — Encounter: Payer: Self-pay | Admitting: Orthopaedic Surgery

## 2019-04-03 ENCOUNTER — Ambulatory Visit (INDEPENDENT_AMBULATORY_CARE_PROVIDER_SITE_OTHER): Payer: Medicare HMO | Admitting: Orthopaedic Surgery

## 2019-04-03 DIAGNOSIS — Z96651 Presence of right artificial knee joint: Secondary | ICD-10-CM

## 2019-04-03 NOTE — Progress Notes (Signed)
Mr. Nada Boozer comes in today for continued evaluation of his right total knee arthroplasty.  He is a year out from surgery.  When I saw him last month he was still having pain and swelling with his knee.  I did draw some fluid off his knee and place a steroid injection in it.  He finally has made a turn for the better in terms of sleeping better and having some decreased pain and decreased swelling of his right knee.  On exam there is still just some mild swelling of his knee but his range of motion is improved overall.  It feels stable ligamentously.  I would still like to see him back in about 3 months to make sure he is still heading in the right path to healing.  I would like an AP and lateral of his right operative knee at that visit.

## 2019-07-03 ENCOUNTER — Ambulatory Visit: Payer: Medicare HMO | Admitting: Orthopaedic Surgery

## 2019-07-30 ENCOUNTER — Other Ambulatory Visit: Payer: Self-pay | Admitting: Orthopaedic Surgery

## 2019-07-30 DIAGNOSIS — M7989 Other specified soft tissue disorders: Secondary | ICD-10-CM

## 2019-08-26 ENCOUNTER — Other Ambulatory Visit: Payer: Self-pay

## 2019-08-26 ENCOUNTER — Emergency Department (HOSPITAL_BASED_OUTPATIENT_CLINIC_OR_DEPARTMENT_OTHER)
Admission: EM | Admit: 2019-08-26 | Discharge: 2019-08-26 | Disposition: A | Discharge status: Home / Self Care | Payer: Medicare HMO | Attending: Emergency Medicine | Admitting: Emergency Medicine

## 2019-08-26 ENCOUNTER — Emergency Department (HOSPITAL_BASED_OUTPATIENT_CLINIC_OR_DEPARTMENT_OTHER): Payer: Medicare HMO

## 2019-08-26 ENCOUNTER — Encounter (HOSPITAL_BASED_OUTPATIENT_CLINIC_OR_DEPARTMENT_OTHER): Payer: Self-pay | Admitting: *Deleted

## 2019-08-26 DIAGNOSIS — Z7982 Long term (current) use of aspirin: Secondary | ICD-10-CM | POA: Insufficient documentation

## 2019-08-26 DIAGNOSIS — Z79899 Other long term (current) drug therapy: Secondary | ICD-10-CM | POA: Insufficient documentation

## 2019-08-26 DIAGNOSIS — R9431 Abnormal electrocardiogram [ECG] [EKG]: Secondary | ICD-10-CM | POA: Insufficient documentation

## 2019-08-26 DIAGNOSIS — Z885 Allergy status to narcotic agent status: Secondary | ICD-10-CM | POA: Diagnosis not present

## 2019-08-26 DIAGNOSIS — B349 Viral infection, unspecified: Secondary | ICD-10-CM | POA: Diagnosis not present

## 2019-08-26 DIAGNOSIS — R0789 Other chest pain: Secondary | ICD-10-CM

## 2019-08-26 LAB — CBC WITH DIFFERENTIAL/PLATELET
Abs Immature Granulocytes: 0.02 10*3/uL (ref 0.00–0.07)
Basophils Absolute: 0 10*3/uL (ref 0.0–0.1)
Basophils Relative: 0 %
Eosinophils Absolute: 0.1 10*3/uL (ref 0.0–0.5)
Eosinophils Relative: 2 %
HCT: 41.9 % (ref 39.0–52.0)
Hemoglobin: 14.3 g/dL (ref 13.0–17.0)
Immature Granulocytes: 0 %
Lymphocytes Relative: 11 %
Lymphs Abs: 0.6 10*3/uL — ABNORMAL LOW (ref 0.7–4.0)
MCH: 32 pg (ref 26.0–34.0)
MCHC: 34.1 g/dL (ref 30.0–36.0)
MCV: 93.7 fL (ref 80.0–100.0)
Monocytes Absolute: 0.8 10*3/uL (ref 0.1–1.0)
Monocytes Relative: 15 %
Neutro Abs: 3.9 10*3/uL (ref 1.7–7.7)
Neutrophils Relative %: 72 %
Platelets: 190 10*3/uL (ref 150–400)
RBC: 4.47 MIL/uL (ref 4.22–5.81)
RDW: 13 % (ref 11.5–15.5)
WBC: 5.5 10*3/uL (ref 4.0–10.5)
nRBC: 0 % (ref 0.0–0.2)

## 2019-08-26 LAB — COMPREHENSIVE METABOLIC PANEL
ALT: 20 U/L (ref 0–44)
AST: 22 U/L (ref 15–41)
Albumin: 4 g/dL (ref 3.5–5.0)
Alkaline Phosphatase: 77 U/L (ref 38–126)
Anion gap: 9 (ref 5–15)
BUN: 13 mg/dL (ref 8–23)
CO2: 23 mmol/L (ref 22–32)
Calcium: 7.9 mg/dL — ABNORMAL LOW (ref 8.9–10.3)
Chloride: 103 mmol/L (ref 98–111)
Creatinine, Ser: 0.85 mg/dL (ref 0.61–1.24)
GFR calc Af Amer: >60 mL/min (ref >60)
GFR calc non Af Amer: >60 mL/min (ref >60)
Glucose, Bld: 112 mg/dL — ABNORMAL HIGH (ref 70–99)
Potassium: 3.7 mmol/L (ref 3.5–5.1)
Sodium: 135 mmol/L (ref 135–145)
Total Bilirubin: 0.7 mg/dL (ref 0.3–1.2)
Total Protein: 6.6 g/dL (ref 6.5–8.1)

## 2019-08-26 LAB — TROPONIN I (HIGH SENSITIVITY): Troponin I (High Sensitivity): 6 ng/L (ref <18)

## 2019-08-26 IMAGING — DX DG CHEST 1V PORT
1 series · 1 of 1 positions shown · non-contrast
Comparison: [DATE]

CLINICAL DATA: Chest tightness, history of vomiting and diarrhea,
short of breath and fatigue for 1 day

EXAM:
PORTABLE CHEST 1 VIEW

[chest ap]
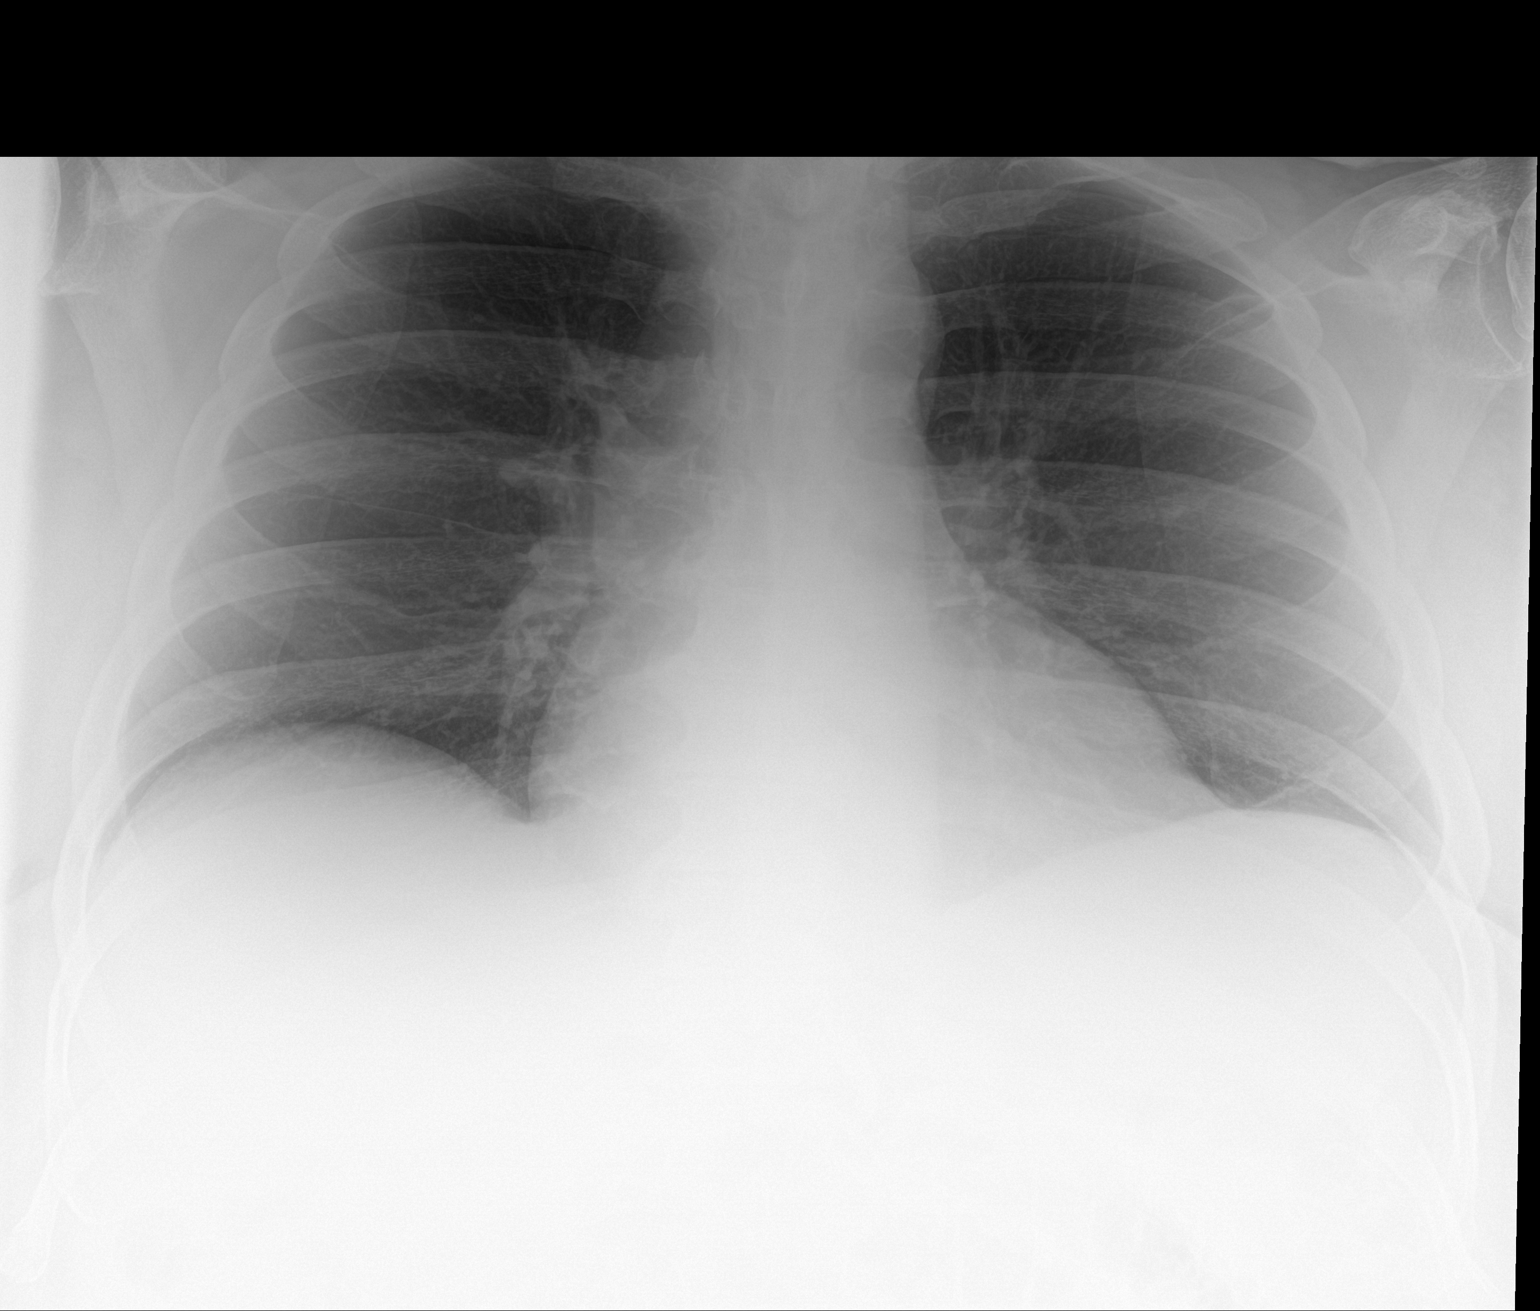

[1 of 1 positions shown; findings below may reference images not displayed]

FINDINGS: The heart size and mediastinal contours are within normal limits.
Both lungs are clear. The visualized skeletal structures are
unremarkable.
IMPRESSION: No active disease.

## 2019-08-26 NOTE — ED Provider Notes (Signed)
[facility] EMERGENCY DEPARTMENT Provider Note   CSN: [id number] Arrival date & time: 08/26/19  1500     History Chief Complaint  Patient presents with  . Chest Pain    Manuel Hunter is a 79 y.o. male.  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  Patient went to see his doctor today for a coronavirus test.  He had a negative COVID-19 test and a stent that was ordered.  He had an EKG there showing lateral T wave inversions so he was sent to the emergency department for evaluation of ACS.  Patient does not have a history of high cholesterol, hypertension, smoking.  No previous cardiac history.  Onset of symptoms acute.  Course is constant.        Past Medical History:  Diagnosis Date  . Arthritis   . Complication of anesthesia    States "I woke up to soon one time" with rotator cuff repair  . GERD (gastroesophageal reflux disease)   . Headache   . History of hiatal hernia   . Lower extremity edema    occasionally takes furosemide prn  . Prostate cancer ([place]) 2007   tx with surgery  . RLS (restless legs syndrome)   . Rotator cuff tear    right  . Sleep apnea    uses CPAP nightly    Patient Active Problem List   Diagnosis Date Noted  . Unilateral primary osteoarthritis, right knee 03/23/2018  . Status post total knee replacement, right 03/23/2018  . 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 FINGER ARTHROPLASTY Left 03/10/2017   Procedure: Left index finger metacarpal phalangeal arthroplasty;  Surgeon: Roseanne Kaufman, MD;  Location: [facility];   Service: Orthopedics;  Laterality: Left;  90 mins  . KNEE SURGERY Bilateral   . left knee replacment  2009  . MANDIBLE FRACTURE SURGERY     left 2 titanium plates and 10 scres  . NECK SURGERY     cervical with bone graft  . PROSTATE SURGERY     prostatctomy  . ROTATOR CUFF REPAIR     x 2 right x 2 left  . TOTAL HIP ARTHROPLASTY Left 09/13/2016   Procedure: LEFT TOTAL HIP ARTHROPLASTY ANTERIOR APPROACH;  Surgeon: [name], MD;  Location: [facility];  Service: Orthopedics;  Laterality: Left;  . TOTAL KNEE ARTHROPLASTY Right 03/23/2018   Procedure: RIGHT TOTAL KNEE ARTHROPLASTY;  Surgeon: [name], MD;  Location: [facility];  Service: Orthopedics;  Laterality: Right;       No family history on file.  Social History   Tobacco Use  . Smoking status: Never Smoker  . Smokeless tobacco: Never Used  Substance Use Topics  . Alcohol use: Never  . Drug use: Never    Home Medications Prior to Admission medications   Medication Sig Start Date End Date Taking? Authorizing Provider  amoxicillin (AMOXIL) 500 MG tablet Take 2,000 mg by mouth See admin instructions. 2000 mg before dental procedures    [provider]  aspirin EC 325 MG EC tablet Take 1 tablet (325 mg total) by mouth 2 (two) times daily. Patient not taking: Reported on  04/23/2018 03/24/18   [name], MD  furosemide (LASIX) 20 MG tablet Take 20 mg by mouth daily as needed for edema.    [provider]  HYDROmorphone (DILAUDID) 2 MG tablet Take 1 tablet (2 mg total) by mouth every 4 (four) hours as needed for severe pain. Patient not taking: Reported on 04/23/2018 04/16/18   [name], MD  methocarbamol (ROBAXIN) 500 MG tablet Take 1 tablet (500 mg total) by mouth every 6 (six) hours as needed for muscle spasms. 06/06/18   [name], MD  naproxen (NAPROSYN) 500 MG tablet Take 1 tablet (500 mg total) by mouth 2 (two) times daily with a meal. 05/03/18    [name], MD  oxyCODONE (ROXICODONE) 5 MG immediate release tablet Take 1 tablet (5 mg total) by mouth every 6 (six) hours as needed for severe pain. 03/06/19   [name], MD  pantoprazole (PROTONIX) 40 MG tablet Take 40 mg by mouth every morning.  09/10/14   [provider]  phenytoin (DILANTIN) 100 MG ER capsule Take 100-200 mg by mouth See admin instructions. 100 mg in the morning, 200 mg at bedtime 09/01/15   [provider]  rOPINIRole (REQUIP) 1 MG tablet Take 1 mg by mouth 3 (three) times daily with meals.     [provider]  rOPINIRole (REQUIP) 3 MG tablet Take 3 mg by mouth at bedtime.    [provider]  zolpidem (AMBIEN) 10 MG tablet Take 10 mg by mouth at bedtime as needed for sleep. 03/28/16   [provider]    Allergies    Morphine  Review of Systems   Review of Systems  Constitutional: Positive for fatigue. Negative for fever.  HENT: Negative for rhinorrhea and sore throat.   Eyes: Negative for redness.  Respiratory: Positive for chest tightness and shortness of breath. Negative for cough.   Cardiovascular: Negative for chest pain and leg swelling.  Gastrointestinal: Positive for diarrhea, nausea and vomiting. Negative for abdominal pain.  Genitourinary: Negative for dysuria.  Musculoskeletal: Negative for myalgias.  Skin: Negative for rash.  Neurological: Negative for headaches.    Physical Exam Updated Vital Signs BP 139/67   Pulse 71   Temp 98.2 F (36.8 C) (Oral)   Resp 20   Ht 5\' 6"  (1.676 m)   Wt 103.4 kg   SpO2 98%   BMI 36.80 kg/m   Physical Exam Vitals and nursing note reviewed.  Constitutional:      Appearance: He is well-developed. He is not diaphoretic.  HENT:     Head: Normocephalic and atraumatic.     Jaw: No trismus.     Right Ear: Tympanic membrane, ear canal and external ear normal.     Left Ear: Tympanic membrane, ear canal and external ear normal.     Nose: Nose  normal. No mucosal edema or rhinorrhea.     Mouth/Throat:     Mouth: Mucous membranes are not dry.     Pharynx: Uvula midline. No oropharyngeal exudate, posterior oropharyngeal erythema or uvula swelling.     Tonsils: No tonsillar abscesses.  Eyes:     General:        Right eye: No discharge.        Left eye: No discharge.     Conjunctiva/sclera: Conjunctivae normal.  Neck:     Vascular: Normal carotid pulses. No carotid bruit or JVD.     Trachea: Trachea normal. No tracheal deviation.  Cardiovascular:  Rate and Rhythm: Normal rate and regular rhythm.     Pulses: No decreased pulses.     Heart sounds: Normal heart sounds, S1 normal and S2 normal. Heart sounds not distant. No murmur.  Pulmonary:     Effort: Pulmonary effort is normal. No respiratory distress.     Breath sounds: Normal breath sounds. No wheezing or rales.  Chest:     Chest wall: No tenderness.  Abdominal:     General: Bowel sounds are normal.     Palpations: Abdomen is soft.     Tenderness: There is no abdominal tenderness. There is no guarding or rebound.  Musculoskeletal:     Cervical back: Normal range of motion and neck supple. No muscular tenderness.  Skin:    General: Skin is warm and dry.     Coloration: Skin is not pale.  Neurological:     Mental Status: He is alert.     ED Results / Procedures / Treatments   Labs (all labs ordered are listed, but only abnormal results are displayed) Labs Reviewed  CBC WITH DIFFERENTIAL/PLATELET - Abnormal; Notable for the following components:      Result Value   Lymphs Abs 0.6 (*)    All other components within normal limits  COMPREHENSIVE METABOLIC PANEL - Abnormal; Notable for the following components:   Glucose, Bld 112 (*)    Calcium 7.9 (*)    All other components within normal limits  TROPONIN I (HIGH SENSITIVITY)  TROPONIN I (HIGH SENSITIVITY)    ED ECG REPORT   Date: 08/26/2019  Rate: 68    Rhythm: normal sinus rhythm  QRS Axis: normal   Intervals: normal  ST/T Wave abnormalities: nonspecific T wave changes  Conduction Disutrbances:none  Narrative Interpretation:   Old EKG Reviewed: TW changes present in October 31, 2013, laterally, more pronounced today  I have personally reviewed the EKG tracing and agree with the computerized printout as noted.  Radiology DG Chest Port 1 View  Result Date: 08/26/2019 CLINICAL DATA:  Chest tightness, history of vomiting and diarrhea, short of breath and fatigue for 1 day EXAM: PORTABLE CHEST 1 VIEW COMPARISON:  09/22/2003 FINDINGS: The heart size and mediastinal contours are within normal limits. Both lungs are clear. The visualized skeletal structures are unremarkable. IMPRESSION: No active disease. Electronically Signed   By: Randa Ngo M.D.   On: 08/26/2019 16:09    Procedures Procedures (including critical care time)  Medications Ordered in ED Medications - No data to display  ED Course  I have reviewed the triage vital signs and the nursing notes.  Pertinent labs & imaging results that were available during my care of the patient were reviewed by me and considered in my medical decision making (see chart for details).  Patient seen and examined. EKG reviewed and comparison from 10-31-13. Work-up initiated. Medications ordered.   Vital signs reviewed and are as follows: BP 139/67   Pulse 71   Temp 98.2 F (36.8 C) (Oral)   Resp 20   Ht 5\' 6"  (1.676 m)   Wt 103.4 kg   SpO2 98%   BMI 36.80 kg/m   Discussed with Dr. Rogene Houston who will see at some point.   5:09 PM work-up in the ED is reassuring.  Troponin is normal.  Patient appears comfortable and in no distress.  Patient discussed with and seen by Dr. Rogene Houston.  Patient cleared for discharge home at this time.  Covid testing is pending.  Patient was counseled to return with severe chest  pain, especially if the pain is crushing or pressure-like and spreads to the arms, back, neck, or jaw, or if they have sweating, nausea, or  shortness of breath with the pain. They were encouraged to call 911 with these symptoms.   They were also told to return if their chest pain gets worse and does not go away with rest, they have an attack of chest pain lasting longer than usual despite rest and treatment with the medications their caregiver has prescribed, if they wake from sleep with chest pain or shortness of breath, if they feel dizzy or faint, if they have chest pain not typical of their usual pain, or if they have any other emergent concerns regarding their health.  The patient verbalized understanding and agreed.      MDM Rules/Calculators/A&P                      Patient with chest tightness, abnormal EKG in setting of recent fatigue, diarrhea, vomiting.  Chest symptoms are nonexertional.  Patient has risk factors of borderline diabetes and age.  Troponin is negative x1.  Do not feel patient requires delta troponin his symptoms have been present for 2 days.  EKG with T wave inversions.  Different than 2015 however 2015 did have T wave changes as well.  Low concern for pneumonia, PE, dissection based on history.  Symptoms are atypical for ACS.  Viral illness such as coronavirus is still possibility and patient has been appropriately tested for this.  Awaiting testing.  Strict return instructions as above.  Encouraged PCP follow-up.   Final Clinical Impression(s) / ED Diagnoses Final diagnoses:  Chest tightness  Viral syndrome  Abnormal EKG    Rx / DC Orders ED Discharge Orders    None       Carlisle Cater, PA-C 08/26/19 1711    Fredia Sorrow, MD 08/26/19 [phone]

## 2019-08-26 NOTE — ED Provider Notes (Signed)
Medical screening examination/treatment/procedure(s) were conducted as a shared visit with non-physician practitioner(s) and myself.  I personally evaluated the patient during the encounter.  EKG Interpretation  Date/Time:  Monday August 26 2019 15:11:19 EST Ventricular Rate:  68 PR Interval:  150 QRS Duration: 90 QT Interval:  422 QTC Calculation: 448 R Axis:   42 Text Interpretation: Normal sinus rhythm T wave abnormality, consider inferolateral ischemia Abnormal ECG Confirmed by Fredia Sorrow [phone]) on 08/26/2019 3:45:00 PM   Results for orders placed or performed during the hospital encounter of 08/26/19  CBC with Differential/Platelet  Result Value Ref Range   WBC 5.5 4.0 - 10.5 K/uL   RBC 4.47 4.22 - 5.81 MIL/uL   Hemoglobin 14.3 13.0 - 17.0 g/dL   HCT 41.9 39.0 - 52.0 %   MCV 93.7 80.0 - 100.0 fL   MCH 32.0 26.0 - 34.0 pg   MCHC 34.1 30.0 - 36.0 g/dL   RDW 13.0 11.5 - 15.5 %   Platelets 190 150 - 400 K/uL   nRBC 0.0 0.0 - 0.2 %   Neutrophils Relative % 72 %   Neutro Abs 3.9 1.7 - 7.7 K/uL   Lymphocytes Relative 11 %   Lymphs Abs 0.6 (L) 0.7 - 4.0 K/uL   Monocytes Relative 15 %   Monocytes Absolute 0.8 0.1 - 1.0 K/uL   Eosinophils Relative 2 %   Eosinophils Absolute 0.1 0.0 - 0.5 K/uL   Basophils Relative 0 %   Basophils Absolute 0.0 0.0 - 0.1 K/uL   Immature Granulocytes 0 %   Abs Immature Granulocytes 0.02 0.00 - 0.07 K/uL  Comprehensive metabolic panel  Result Value Ref Range   Sodium 135 135 - 145 mmol/L   Potassium 3.7 3.5 - 5.1 mmol/L   Chloride 103 98 - 111 mmol/L   CO2 23 22 - 32 mmol/L   Glucose, Bld 112 (H) 70 - 99 mg/dL   BUN 13 8 - 23 mg/dL   Creatinine, Ser 0.85 0.61 - 1.24 mg/dL   Calcium 7.9 (L) 8.9 - 10.3 mg/dL   Total Protein 6.6 6.5 - 8.1 g/dL   Albumin 4.0 3.5 - 5.0 g/dL   AST 22 15 - 41 U/L   ALT 20 0 - 44 U/L   Alkaline Phosphatase 77 38 - 126 U/L   Total Bilirubin 0.7 0.3 - 1.2 mg/dL   GFR calc non Af Amer >60 >60 mL/min   GFR  calc Af Amer >60 >60 mL/min   Anion gap 9 5 - 15  Troponin I (High Sensitivity)  Result Value Ref Range   Troponin I (High Sensitivity) 6 <18 ng/L   DG Chest Port 1 View  Result Date: 08/26/2019 CLINICAL DATA:  Chest tightness, history of vomiting and diarrhea, short of breath and fatigue for 1 day EXAM: PORTABLE CHEST 1 VIEW COMPARISON:  09/22/2003 FINDINGS: The heart size and mediastinal contours are within normal limits. Both lungs are clear. The visualized skeletal structures are unremarkable. IMPRESSION: No active disease. Electronically Signed   By: Randa Ngo M.D.   On: 08/26/2019 16:09     Patient seen by me along with the physician assistant.  Patient presenting 3-day history of chest discomfort anteriorly.  But also yesterday had diarrhea and vomited once.  He is more concerned about just feeling as if he is not breathing that well.  And fatigue.  Oxygen saturations here 98% on room air not tachycardic.  Patient's primary care doctor saw him today tested him with a rapid  test for Covid which was negative.  Followed up with a formal test.  Referred here because on EKG they saw inverted T waves inferiorly and laterally.  Based on our chart review that is new since 2015.  No evidence of any acute cardiac or STEMI event on the EKG.  Chest x-ray negative here labs without significant abnormality and troponin was normal at 6.  Do not feel he needs a delta troponin his symptoms and the chest discomfort which are very atypical of been present for a few days.  Possibly could be related to a viral process or even COVID-19 although not proven at this time.  Patient stable for discharge home.  Patient given precautions return for any severe chest pain lasting 15 minutes or longer.   Fredia Sorrow, MD 08/26/19 1705

## 2019-08-26 NOTE — Discharge Instructions (Signed)
Please read and follow all provided instructions.  Your diagnoses today include:  1. Chest tightness   2. Viral syndrome     Tests performed today include:  An EKG of your heart - is abnormal with what we call t-wave changes  A chest x-ray  Cardiac enzymes - a blood test for heart muscle damage  Blood counts and electrolytes  Vital signs. See below for your results today.   Medications prescribed:   None  Take any prescribed medications only as directed.  Follow-up instructions: Please follow-up with your primary care provider as soon as you can for further evaluation of your symptoms.   Return instructions:  SEEK IMMEDIATE MEDICAL ATTENTION IF:  You have severe chest pain, especially if the pain is crushing or pressure-like and spreads to the arms, back, neck, or jaw, or if you have sweating, nausea (feeling sick to your stomach), or shortness of breath. THIS IS AN EMERGENCY. Don't wait to see if the pain will go away. Get medical help at once. Call 911 or 0 (operator). DO NOT drive yourself to the hospital.   Your chest pain gets worse and does not go away with rest.   You have an attack of chest pain lasting longer than usual, despite rest and treatment with the medications your caregiver has prescribed.   You wake from sleep with chest pain or shortness of breath.  You feel dizzy or faint.  You have chest pain not typical of your usual pain for which you originally saw your caregiver.   You have any other emergent concerns regarding your health.  Additional Information: Chest pain comes from many different causes. Your caregiver has diagnosed you as having chest pain that is not specific for one problem, but does not require admission.  You are at low risk for an acute heart condition or other serious illness.   Your vital signs today were: BP 124/78   Pulse 65   Temp 98.2 F (36.8 C) (Oral)   Resp 20   Ht 5\' 6"  (1.676 m)   Wt 103.4 kg   SpO2 98%   BMI  36.80 kg/m  If your blood pressure (BP) was elevated above 135/85 this visit, please have this repeated by your doctor within one month. --------------

## 2019-08-26 NOTE — ED Triage Notes (Signed)
Chest tightness. He was seen by his MD and tested for Covid. The rapid test was negative. He had the send out test done. Yesterday he had diarrhea, vomited x 1, sob and fatigue.

## 2019-12-15 ENCOUNTER — Other Ambulatory Visit: Payer: Self-pay

## 2019-12-15 ENCOUNTER — Encounter (HOSPITAL_BASED_OUTPATIENT_CLINIC_OR_DEPARTMENT_OTHER): Payer: Self-pay | Admitting: *Deleted

## 2019-12-15 ENCOUNTER — Emergency Department (HOSPITAL_BASED_OUTPATIENT_CLINIC_OR_DEPARTMENT_OTHER): Payer: Medicare HMO

## 2019-12-15 ENCOUNTER — Emergency Department (HOSPITAL_BASED_OUTPATIENT_CLINIC_OR_DEPARTMENT_OTHER)
Admission: EM | Admit: 2019-12-15 | Discharge: 2019-12-15 | Disposition: A | Discharge status: Home / Self Care | Payer: Medicare HMO | Attending: Emergency Medicine | Admitting: Emergency Medicine

## 2019-12-15 DIAGNOSIS — S80211A Abrasion, right knee, initial encounter: Secondary | ICD-10-CM | POA: Diagnosis present

## 2019-12-15 DIAGNOSIS — Z79899 Other long term (current) drug therapy: Secondary | ICD-10-CM | POA: Diagnosis not present

## 2019-12-15 DIAGNOSIS — W19XXXA Unspecified fall, initial encounter: Secondary | ICD-10-CM | POA: Diagnosis not present

## 2019-12-15 DIAGNOSIS — Z8546 Personal history of malignant neoplasm of prostate: Secondary | ICD-10-CM | POA: Diagnosis not present

## 2019-12-15 DIAGNOSIS — Y939 Activity, unspecified: Secondary | ICD-10-CM | POA: Insufficient documentation

## 2019-12-15 DIAGNOSIS — Y999 Unspecified external cause status: Secondary | ICD-10-CM | POA: Diagnosis not present

## 2019-12-15 DIAGNOSIS — Y929 Unspecified place or not applicable: Secondary | ICD-10-CM | POA: Insufficient documentation

## 2019-12-15 DIAGNOSIS — Z885 Allergy status to narcotic agent status: Secondary | ICD-10-CM | POA: Insufficient documentation

## 2019-12-15 IMAGING — DX DG KNEE COMPLETE 4+V*R*
4 series · 4 of 4 positions shown · non-contrast
Comparison: [DATE]

CLINICAL DATA: 79-year-old male with acute RIGHT knee pain
following fall today. History of RIGHT knee arthroplasty.

EXAM:
RIGHT KNEE - COMPLETE 4+ VIEW

[knee ap]
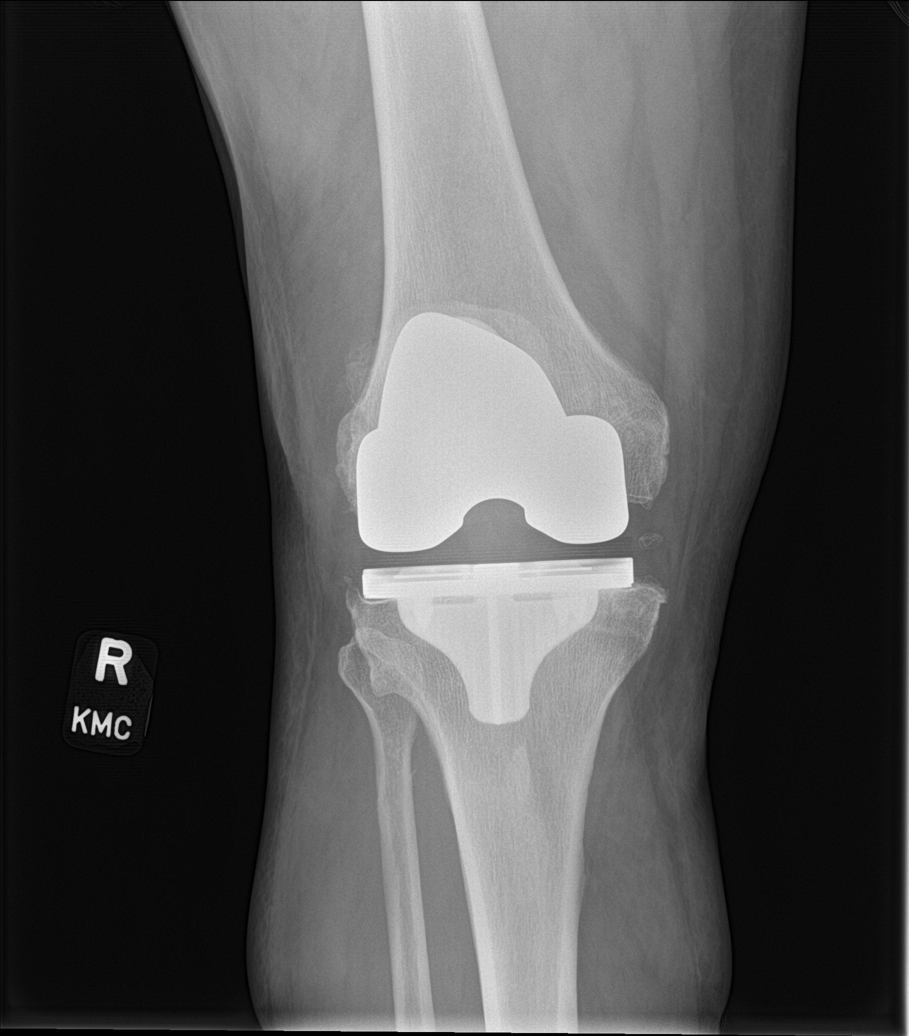

[knee tunnel]
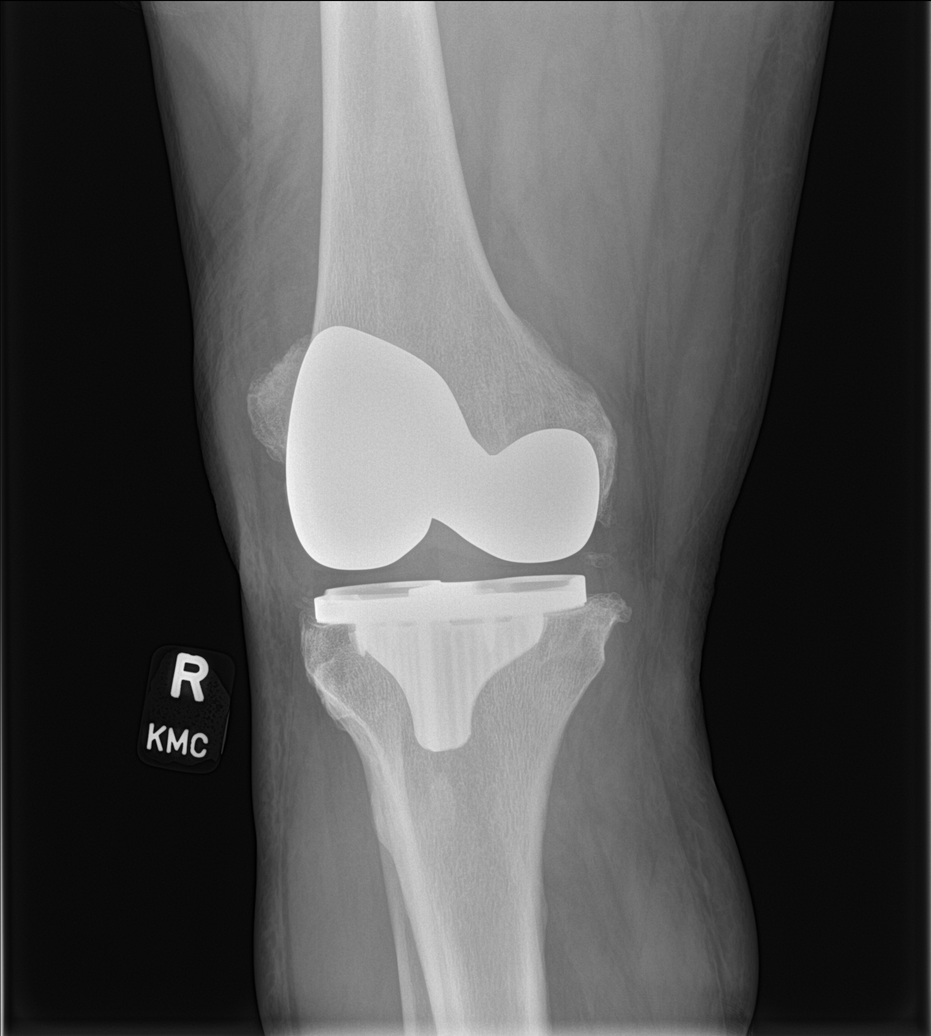

[knee lat]
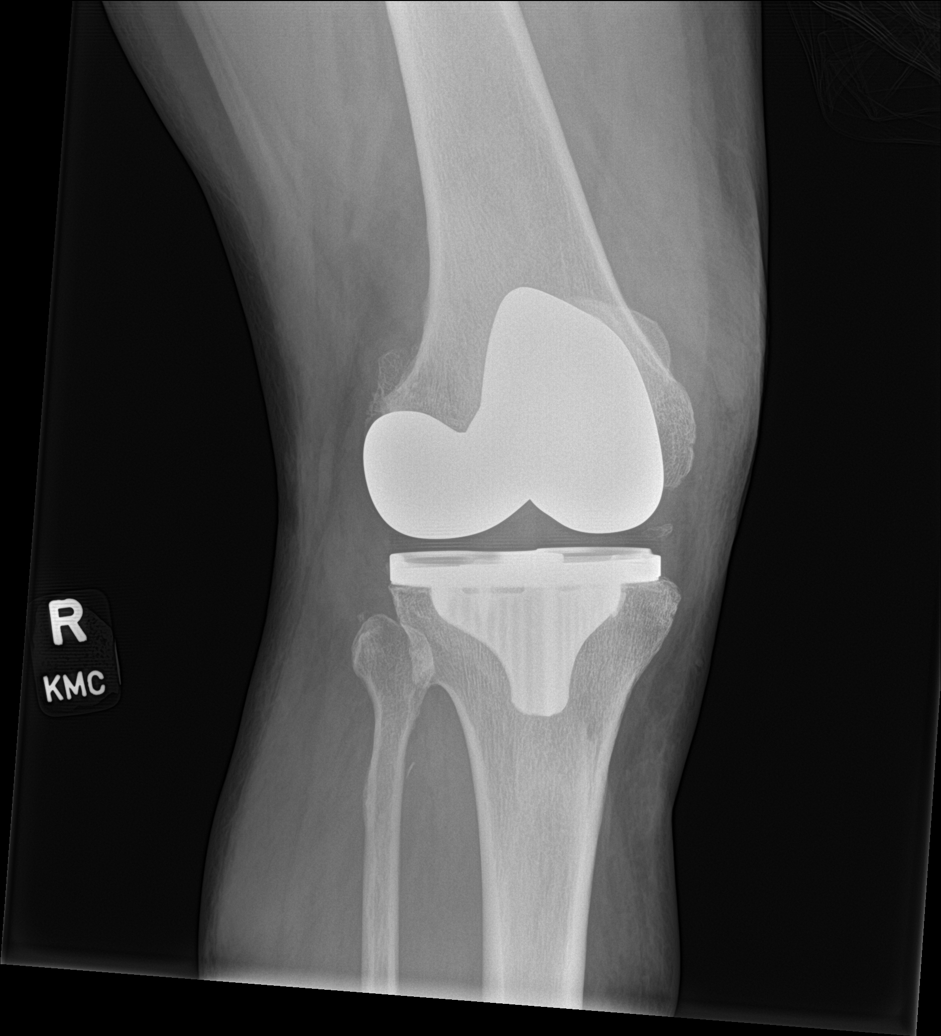

[knee obl]
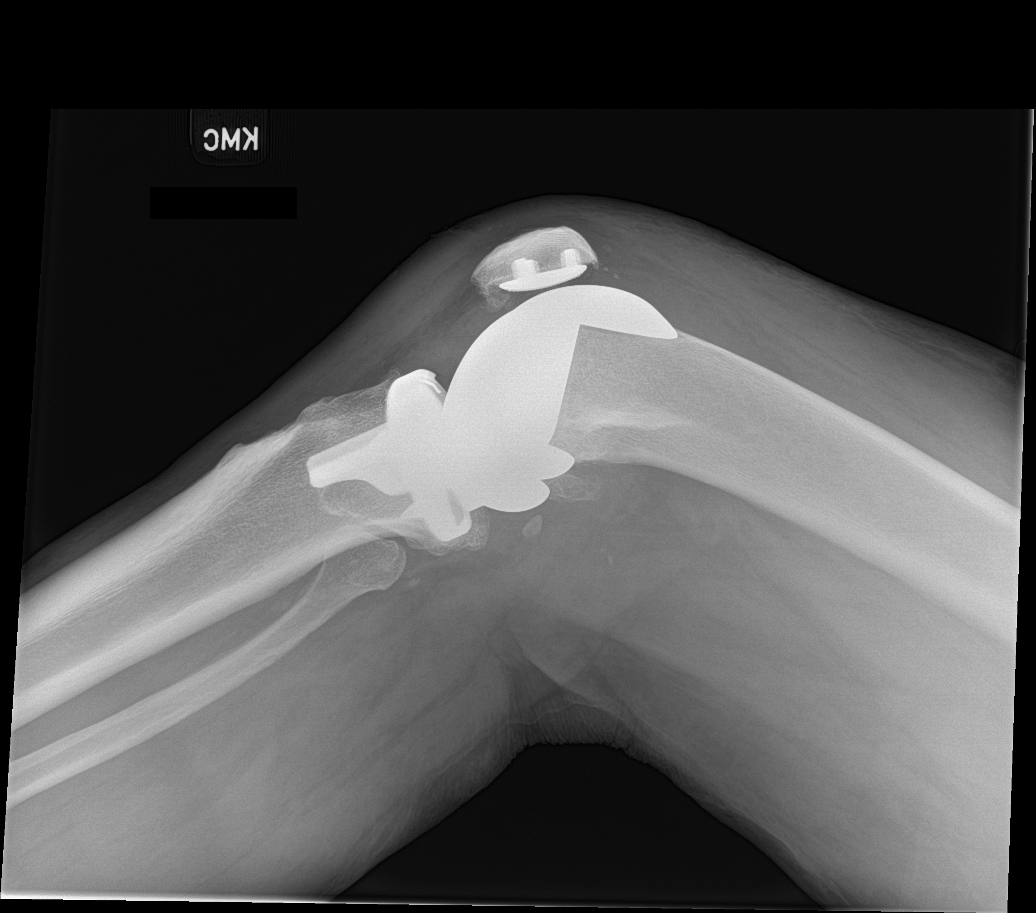

[4 of 4 positions shown; findings below may reference images not displayed]

FINDINGS: RIGHT total knee arthroplasty again noted. No definite complicating
features are noted.

There is no evidence of acute fracture, subluxation or dislocation.

No focal bony lesions are present.
IMPRESSION: No evidence of acute abnormality.

## 2019-12-15 NOTE — ED Notes (Signed)
Wound care provided, bacitracin applied and covered with nonstick pad. Explained to pt and family how to change dressing.  Understood directions.

## 2019-12-15 NOTE — ED Triage Notes (Signed)
Pt reports that he fell yesterday. Pt states that he hit his head-denies LOC no blood thinners. States that he also hit his left shoulder and right knee. Ambulatory without difficulty.

## 2019-12-15 NOTE — Discharge Instructions (Addendum)
Keep wound clean, topical antibiotics for the next few days and no bathtub or swimming pool until healed.  Tylenol as needed for pain.  Watch for signs of infection. Your xray showed no broken bones

## 2019-12-15 NOTE — ED Provider Notes (Signed)
[facility] EMERGENCY DEPARTMENT Provider Note   CSN: [id number] Arrival date & time: 12/15/19  1322     History Chief Complaint  Patient presents with  . Fall    Manuel Hunter is a 79 y.o. male.  Patient presents with isolated injury to his right knee.  Patient fell yesterday barely hit his head no loss of consciousness, no vomiting or confusion.  Has an abrasion to the right leg with abrasion he cleaned it really well.  No signs of infection at this time.        Past Medical History:  Diagnosis Date  . Arthritis   . Complication of anesthesia    States "I woke up to soon one time" with rotator cuff repair  . GERD (gastroesophageal reflux disease)   . Headache   . History of hiatal hernia   . Lower extremity edema    occasionally takes furosemide prn  . Prostate cancer ([place]) 2007   tx with surgery  . RLS (restless legs syndrome)   . Rotator cuff tear    right  . Sleep apnea    uses CPAP nightly    Patient Active Problem List   Diagnosis Date Noted  . Unilateral primary osteoarthritis, right knee 03/23/2018  . Status post total knee replacement, right 03/23/2018  . 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 FINGER ARTHROPLASTY Left 03/10/2017   Procedure: Left index finger metacarpal phalangeal arthroplasty;  Surgeon: Roseanne Kaufman, MD;  Location: [facility];  Service: Orthopedics;  Laterality: Left;  90 mins  . KNEE SURGERY Bilateral   . left knee replacment  2009  . MANDIBLE FRACTURE SURGERY     left 2 titanium plates and 10 scres  . NECK SURGERY     cervical with bone graft  . PROSTATE SURGERY     prostatctomy  . ROTATOR CUFF REPAIR     x 2 right x 2 left  . TOTAL HIP ARTHROPLASTY Left 09/13/2016   Procedure: LEFT TOTAL HIP ARTHROPLASTY ANTERIOR APPROACH;  Surgeon: [name], MD;  Location: [facility];  Service: Orthopedics;  Laterality: Left;  . TOTAL KNEE ARTHROPLASTY Right 03/23/2018   Procedure: RIGHT TOTAL KNEE ARTHROPLASTY;  Surgeon: [name], MD;  Location: [facility];  Service: Orthopedics;  Laterality: Right;       History reviewed. No pertinent family history.  Social History   Tobacco Use  . Smoking status: Never Smoker  . Smokeless tobacco: Never Used  Substance Use Topics  . Alcohol use: Never  . Drug use: Never    Home Medications Prior to Admission medications   Medication Sig Start Date End Date Taking? Authorizing Provider  amoxicillin (AMOXIL) 500 MG tablet Take 2,000 mg by mouth See admin instructions. 2000 mg before dental procedures    [provider]  furosemide (LASIX) 20 MG tablet Take 20 mg by mouth daily as needed for edema.    [provider]  methocarbamol (ROBAXIN) 500 MG tablet Take 1 tablet (500 mg total) by mouth every 6 (six) hours as needed for muscle spasms. 06/06/18   [name], MD  naproxen (NAPROSYN) 500 MG tablet Take 1 tablet (500 mg total) by mouth 2 (two) times daily with a meal. 05/03/18   [name], MD  oxyCODONE (ROXICODONE) 5 MG immediate release tablet Take 1 tablet (5 mg total) by mouth  every 6 (six) hours as needed for severe pain. 03/06/19   [name], MD  pantoprazole (PROTONIX) 40 MG tablet Take 40 mg by mouth every morning.  09/10/14   [provider]  phenytoin (DILANTIN) 100 MG ER capsule Take 100-200 mg by mouth See admin instructions. 100 mg in the morning, 200 mg at bedtime 09/01/15   [provider]  rOPINIRole (REQUIP) 1 MG tablet Take 1 mg by mouth 3 (three) times daily with meals.     [provider]  rOPINIRole (REQUIP) 3 MG tablet Take 3 mg by mouth at bedtime.    [provider]  zolpidem (AMBIEN) 10 MG tablet Take 10 mg by mouth at bedtime as needed for sleep. 03/28/16   [provider]    Allergies      Morphine  Review of Systems   Review of Systems  Constitutional: Negative for chills and fever.  HENT: Negative for congestion.   Eyes: Negative for visual disturbance.  Respiratory: Negative for shortness of breath.   Cardiovascular: Negative for chest pain.  Gastrointestinal: Negative for abdominal pain and vomiting.  Genitourinary: Negative for dysuria and flank pain.  Musculoskeletal: Negative for back pain, neck pain and neck stiffness.  Skin: Positive for rash.  Neurological: Negative for weakness, light-headedness and headaches.    Physical Exam Updated Vital Signs BP 133/70 (BP Location: Left Arm)   Pulse 61   Temp 98.2 F (36.8 C) (Oral)   Resp 18   Ht 5\' 6"  (1.676 m)   Wt 102.1 kg   SpO2 100%   BMI 36.32 kg/m   Physical Exam Vitals and nursing note reviewed.  Constitutional:      Appearance: He is well-developed.  HENT:     Head: Normocephalic and atraumatic.  Eyes:     General:        Right eye: No discharge.        Left eye: No discharge.     Conjunctiva/sclera: Conjunctivae normal.  Neck:     Trachea: No tracheal deviation.  Cardiovascular:     Rate and Rhythm: Normal rate.  Pulmonary:     Effort: Pulmonary effort is normal.  Musculoskeletal:     Cervical back: Normal range of motion and neck supple.  Skin:    General: Skin is warm.     Findings: Rash present.     Comments: Patient has superficial laceration/abrasion right anterior knee with avulsion of skin.  No signs of infection.  Mild tenderness anterior knee with mild effusion.  Patient has no tenderness to right hip or right tibia.  Neurological:     Mental Status: He is alert and oriented to person, place, and time.     ED Results / Procedures / Treatments   Labs (all labs ordered are listed, but only abnormal results are displayed) Labs Reviewed - No data to display  EKG None  Radiology DG Knee Complete 4 Views Right  Result Date: 12/15/2019 CLINICAL DATA:  79 year old male  with acute RIGHT knee pain following fall today. History of RIGHT knee arthroplasty. EXAM: RIGHT KNEE - COMPLETE 4+ VIEW COMPARISON:  03/06/2019 FINDINGS: RIGHT total knee arthroplasty again noted. No definite complicating features are noted. There is no evidence of acute fracture, subluxation or dislocation. No focal bony lesions are present. IMPRESSION: No evidence of acute abnormality. Electronically Signed   By: Margarette Canada M.D.   On: 12/15/2019 14:59    Procedures Procedures (including critical care time)  Medications Ordered in ED  Medications - No data to display  ED Course  I have reviewed the triage vital signs and the nursing notes.  Pertinent labs & imaging results that were available during my care of the patient were reviewed by me and considered in my medical decision making (see chart for details).    MDM Rules/Calculators/A&P                      Patient presents with primary focus of injury to right knee, discussed keeping the wound clean, patient's tetanus up-to-date.  X-ray reviewed no acute fracture.  Supportive care.  Wound care provided in the ER with nonstick dressing. Final Clinical Impression(s) / ED Diagnoses Final diagnoses:  Fall, initial encounter  Knee abrasion, right, initial encounter    Rx / DC Orders ED Discharge Orders    None       Elnora Morrison, MD 12/15/19 1528

## 2020-01-29 NOTE — H&P (Signed)
Patient's anticipated LOS is less than 2 midnights, meeting these requirements: - Younger than 22 - Lives within 1 hour of care - Has a competent adult at home to recover with post-op recover - NO history of  - Chronic pain requiring opiods  - Diabetes  - Coronary Artery Disease  - Heart failure  - Heart attack  - Stroke  - DVT/VTE  - Cardiac arrhythmia  - Respiratory Failure/COPD  - Renal failure  - Anemia  - Advanced Liver disease       Manuel Hunter is an 79 y.o. male.    Chief Complaint: left shoulder pain  HPI: Pt is a 79 y.o. male complaining of left shoulder pain for multiple years. Pain had continually increased since the beginning. X-rays in the clinic show end-stage arthritic changes of the left shoulder. Pt has tried various conservative treatments which have failed to alleviate their symptoms, including injections and therapy. Various options are discussed with the patient. Risks, benefits and expectations were discussed with the patient. Patient understand the risks, benefits and expectations and wishes to proceed with surgery.   PCP:  Reita Cliche, MD  D/C Plans: Home  PMH: Past Medical History:  Diagnosis Date  . Arthritis   . Complication of anesthesia    States "I woke up to soon one time" with rotator cuff repair  . GERD (gastroesophageal reflux disease)   . Headache   . History of hiatal hernia   . Lower extremity edema    occasionally takes furosemide prn  . Prostate cancer ([place]) 2007   tx with surgery  . RLS (restless legs syndrome)   . Rotator cuff tear    right  . Sleep apnea    uses CPAP nightly    PSH: Past Surgical History:  Procedure Laterality Date  . FINGER ARTHROPLASTY Left 03/10/2017   Procedure: Left index finger metacarpal phalangeal arthroplasty;  Surgeon: Roseanne Kaufman, MD;  Location: [facility];  Service: Orthopedics;  Laterality: Left;  90 mins  . KNEE SURGERY Bilateral   . left knee replacment   2009  . MANDIBLE FRACTURE SURGERY     left 2 titanium plates and 10 scres  . NECK SURGERY     cervical with bone graft  . PROSTATE SURGERY     prostatctomy  . ROTATOR CUFF REPAIR     x 2 right x 2 left  . TOTAL HIP ARTHROPLASTY Left 09/13/2016   Procedure: LEFT TOTAL HIP ARTHROPLASTY ANTERIOR APPROACH;  Surgeon: [name], MD;  Location: [facility];  Service: Orthopedics;  Laterality: Left;  . TOTAL KNEE ARTHROPLASTY Right 03/23/2018   Procedure: RIGHT TOTAL KNEE ARTHROPLASTY;  Surgeon: [name], MD;  Location: [facility];  Service: Orthopedics;  Laterality: Right;    Social History:  reports that he has never smoked. He has never used smokeless tobacco. He reports that he does not drink alcohol and does not use drugs.  Allergies:  Allergies  Allergen Reactions  . Morphine Nausea And Vomiting    Medications: No current facility-administered medications for this encounter.   Current Outpatient Medications  Medication Sig Dispense Refill  . amoxicillin (AMOXIL) 500 MG tablet Take 2,000 mg by mouth See admin instructions. Take 2000 mg by mouth before dental procedures    . atorvastatin (LIPITOR) 10 MG tablet Take 10 mg by mouth daily.    . furosemide (LASIX) 20 MG tablet Take 20 mg by mouth daily as needed for edema.    . gabapentin (  NEURONTIN) 100 MG capsule Take 100 mg by mouth 3 (three) times daily.    . metFORMIN (GLUCOPHAGE-XR) 500 MG 24 hr tablet Take 500 mg by mouth daily.    . methocarbamol (ROBAXIN) 500 MG tablet Take 1 tablet (500 mg total) by mouth every 6 (six) hours as needed for muscle spasms. 50 tablet 0  . pantoprazole (PROTONIX) 40 MG tablet Take 40 mg by mouth every morning.     . phenytoin (DILANTIN) 100 MG ER capsule Take 100-200 mg by mouth See admin instructions. Take 100 mg by mouth in the morning and 200 mg at night    . rOPINIRole (REQUIP) 1 MG tablet Take 1 mg by mouth 3 (three) times daily with meals.     Marland Kitchen rOPINIRole (REQUIP) 3 MG tablet  Take 3 mg by mouth at bedtime.    Marland Kitchen zolpidem (AMBIEN) 5 MG tablet Take 5 mg by mouth at bedtime as needed for sleep.    . naproxen (NAPROSYN) 500 MG tablet Take 1 tablet (500 mg total) by mouth 2 (two) times daily with a meal. (Patient not taking: Reported on 01/23/2020) 60 tablet 1  . oxyCODONE (ROXICODONE) 5 MG immediate release tablet Take 1 tablet (5 mg total) by mouth every 6 (six) hours as needed for severe pain. (Patient not taking: Reported on 01/23/2020) 30 tablet 0  . zolpidem (AMBIEN) 10 MG tablet Take 10 mg by mouth at bedtime as needed for sleep. (Patient not taking: Reported on 01/23/2020)      No results found for this or any previous visit (from the past 48 hour(s)). No results found.  ROS: Pain with rom of the left upper extremity  Physical Exam: Alert and oriented 79 y.o. male in no acute distress Cranial nerves 2-12 intact Cervical spine: full rom with no tenderness, nv intact distally Chest: active breath sounds bilaterally, no wheeze rhonchi or rales Heart: regular rate and rhythm, no murmur Abd: non tender non distended with active bowel sounds Hip is stable with rom  Left shoulder painful rom nv intact distally Moderate weakness with ER and IR No rashes or edema  Assessment/Plan Assessment: left shoulder cuff arthropathy  Plan:  Patient will undergo a left reverse total shoulder by Dr. Veverly Fells at [facility] Risks benefits and expectations were discussed with the patient. Patient understand risks, benefits and expectations and wishes to proceed. Preoperative templating of the joint replacement has been completed, documented, and submitted to the Operating Room personnel in order to optimize intra-operative equipment management.   Merla Riches PA-C, MPAS [facility] is now [employer] [facility] [address], [place], [place], [place] [address] Phone: [phone] [email or web address]  [employer]

## 2020-02-04 NOTE — Patient Instructions (Addendum)
DUE TO COVID-19 ONLY ONE VISITOR IS ALLOWED TO COME WITH YOU AND STAY IN THE WAITING ROOM ONLY DURING PRE OP AND PROCEDURE DAY OF SURGERY. THE 1 VISITOR MAY VISIT WITH YOU AFTER SURGERY IN YOUR PRIVATE ROOM DURING VISITING HOURS ONLY!  YOU NEED TO HAVE A COVID 19 TEST ON 02-11-20 @ 3:00 PM, THIS TEST MUST BE DONE BEFORE SURGERY, COME  [place], [place] , [id number].  ([place]) ONCE YOUR COVID TEST IS COMPLETED, PLEASE BEGIN THE QUARANTINE INSTRUCTIONS AS OUTLINED IN YOUR HANDOUT.                RANDE ROYLANCE  02/04/2020   Your procedure is scheduled on: 02-14-20   Report to [facility] Main  Entrance    Report to Admitting at 7:25 AM     Call this number if you have problems the morning of surgery [phone]    Remember: After Midnight, You may have a Clear Liquid Diet until 6:55 AM. After 6:55 AM, nothing until after surgery.   CLEAR LIQUID DIET   Foods Allowed                                                                     Foods Excluded  Coffee and tea, regular and decaf                             liquids that you cannot  Plain Jell-O any favor except red or purple                                           see through such as: Fruit ices (not with fruit pulp)                                     milk, soups, orange juice  Iced Popsicles                                    All solid food Carbonated beverages, regular and diet                                    Cranberry, grape and apple juices Sports drinks like Gatorade Lightly seasoned clear broth or consume(fat free) Sugar, honey syrup   _____________________________________________________________________    Take these medicines the morning of surgery with A SIP OF WATER: Gabapentin (Neurontin), Pantoprazole (Protonix), Dilantin (Phenytoin), and Requip (Ropinole)   BRUSH YOUR TEETH MORNING OF SURGERY AND RINSE YOUR MOUTH OUT, NO CHEWING GUM CANDY OR MINTS.   DO NOT TAKE  ANY DIABETIC MEDICATIONS DAY OF YOUR SURGERY                               You may not have any metal on your body including hair  pins and              piercings     Do not wear jewelry, cologne, lotions, powders or deodorant                          Men may shave face and neck.   Do not bring valuables to the hospital. [place].  Contacts, dentures or bridgework may not be worn into surgery.  You may bring a small overnight bag   Special Instructions: N/A              Please read over the following fact sheets you were given: _____________________________________________________________________  How to Manage Your Diabetes Before and After Surgery  Why is it important to control my blood sugar before and after surgery? . Improving blood sugar levels before and after surgery helps healing and can limit problems. . A way of improving blood sugar control is eating a healthy diet by: o  Eating less sugar and carbohydrates o  Increasing activity/exercise o  Talking with your doctor about reaching your blood sugar goals . High blood sugars (greater than 180 mg/dL) can raise your risk of infections and slow your recovery, so you will need to focus on controlling your diabetes during the weeks before surgery. . Make sure that the doctor who takes care of your diabetes knows about your planned surgery including the date and location.  How do I manage my blood sugar before surgery? . Check your blood sugar at least 4 times a day, starting 2 days before surgery, to make sure that the level is not too high or low. o Check your blood sugar the morning of your surgery when you wake up and every 2 hours until you get to the Short Stay unit. . If your blood sugar is less than 70 mg/dL, you will need to treat for low blood sugar: o Do not take insulin. o Treat a low blood sugar (less than 70 mg/dL) with  cup of clear juice (cranberry or apple), 4  glucose tablets, OR glucose gel. o Recheck blood sugar in 15 minutes after treatment (to make sure it is greater than 70 mg/dL). If your blood sugar is not greater than 70 mg/dL on recheck, call [phone] for further instructions. . Report your blood sugar to the short stay nurse when you get to Short Stay.  . If you are admitted to the hospital after surgery: o Your blood sugar will be checked by the staff and you will probably be given insulin after surgery (instead of oral diabetes medicines) to make sure you have good blood sugar levels. o The goal for blood sugar control after surgery is 80-180 mg/dL.   WHAT DO I DO ABOUT MY DIABETES MEDICATION?  Marland Kitchen Do not take oral diabetes medicines (pills) the morning of surgery.  THE DAY BEFORE SURGERY, take your usual prescribed Metformin    Reviewed and Endorsed by [facility] Patient Education Committee, August 2015           [facility]- Preparing for Total Shoulder Arthroplasty    Before surgery, you can play an important role. Because skin is not sterile, your skin needs to be as free of germs as possible. You can reduce the number of germs on your skin by using the following products. . Benzoyl Peroxide Gel  o Reduces the number of germs present on the skin o Applied twice a day to shoulder area starting two days before surgery    ==================================================================  Please follow these instructions carefully:  BENZOYL PEROXIDE 5% GEL  Please do not use if you have an allergy to benzoyl peroxide.   If your skin becomes reddened/irritated stop using the benzoyl peroxide.  Starting two days before surgery, apply as follows: 1. Apply benzoyl peroxide in the morning and at night. Apply after taking a shower. If you are not taking a shower clean entire shoulder front, back, and side along with the armpit with a clean wet washcloth.  2. Place a quarter-sized dollop on your shoulder and rub in thoroughly,  making sure to cover the front, back, and side of your shoulder, along with the armpit.   2 days before ____ AM   ____ PM              1 day before ____ AM   ____ PM                         3. Do this twice a day for two days.  (Last application is the night before surgery, AFTER using the CHG soap as described below).  4. Do NOT apply benzoyl peroxide gel on the day of surgery. [place] - Preparing for Surgery Before surgery, you can play an important role.  Because skin is not sterile, your skin needs to be as free of germs as possible.  You can reduce the number of germs on your skin by washing with CHG (chlorahexidine gluconate) soap before surgery.  CHG is an antiseptic cleaner which kills germs and bonds with the skin to continue killing germs even after washing. Please DO NOT use if you have an allergy to CHG or antibacterial soaps.  If your skin becomes reddened/irritated stop using the CHG and inform your nurse when you arrive at Short Stay. Do not shave (including legs and underarms) for at least 48 hours prior to the first CHG shower.  You may shave your face/neck. Please follow these instructions carefully:  1.  Shower with CHG Soap the night before surgery and the  morning of Surgery.  2.  If you choose to wash your hair, wash your hair first as usual with your  normal  shampoo.  3.  After you shampoo, rinse your hair and body thoroughly to remove the  shampoo.                           4.  Use CHG as you would any other liquid soap.  You can apply chg directly  to the skin and wash                       Gently with a scrungie or clean washcloth.  5.  Apply the CHG Soap to your body ONLY FROM THE NECK DOWN.   Do not use on face/ open                           Wound or open sores. Avoid contact with eyes, ears mouth and genitals (private parts).                       Wash face,  Genitals (private parts) with your normal soap.  6.  Wash thoroughly, paying special attention  to the area where your surgery  will be performed.  7.  Thoroughly rinse your body with warm water from the neck down.  8.  DO NOT shower/wash with your normal soap after using and rinsing off  the CHG Soap.                9.  Pat yourself dry with a clean towel.            10.  Wear clean pajamas.            11.  Place clean sheets on your bed the night of your first shower and do not  sleep with pets. Day of Surgery : Do not apply any lotions/deodorants the morning of surgery.  Please wear clean clothes to the hospital/surgery center.  FAILURE TO FOLLOW THESE INSTRUCTIONS MAY RESULT IN THE CANCELLATION OF YOUR SURGERY PATIENT SIGNATURE_________________________________  NURSE SIGNATURE__________________________________  ________________________________________________________________________   Adam Phenix  An incentive spirometer is a tool that can help keep your lungs clear and active. This tool measures how well you are filling your lungs with each breath. Taking long deep breaths may help reverse or decrease the chance of developing breathing (pulmonary) problems (especially infection) following:  A long period of time when you are unable to move or be active. BEFORE THE PROCEDURE   If the spirometer includes an indicator to show your best effort, your nurse or respiratory therapist will set it to a desired goal.  If possible, sit up straight or lean slightly forward. Try not to slouch.  Hold the incentive spirometer in an upright position. INSTRUCTIONS FOR USE  1. Sit on the edge of your bed if possible, or sit up as far as you can in bed or on a chair. 2. Hold the incentive spirometer in an upright position. 3. Breathe out normally. 4. Place the mouthpiece in your mouth and seal your lips tightly around it. 5. Breathe in slowly and as deeply as possible, raising the piston or the ball toward the top of the column. 6. Hold your breath for 3-5 seconds or for as long as  possible. Allow the piston or ball to fall to the bottom of the column. 7. Remove the mouthpiece from your mouth and breathe out normally. 8. Rest for a few seconds and repeat Steps 1 through 7 at least 10 times every 1-2 hours when you are awake. Take your time and take a few normal breaths between deep breaths. 9. The spirometer may include an indicator to show your best effort. Use the indicator as a goal to work toward during each repetition. 10. After each set of 10 deep breaths, practice coughing to be sure your lungs are clear. If you have an incision (the cut made at the time of surgery), support your incision when coughing by placing a pillow or rolled up towels firmly against it. Once you are able to get out of bed, walk around indoors and cough well. You may stop using the incentive spirometer when instructed by your caregiver.  RISKS AND COMPLICATIONS  Take your time so you do not get dizzy or light-headed.  If you are in pain, you may need to take or ask for pain medication before doing incentive spirometry. It is harder to take a deep breath if you are having pain. AFTER USE  Rest and breathe slowly and easily.  It can be helpful to keep track of a log of  your progress. Your caregiver can provide you with a simple table to help with this. If you are using the spirometer at home, follow these instructions: [place] IF:   You are having difficultly using the spirometer.  You have trouble using the spirometer as often as instructed.  Your pain medication is not giving enough relief while using the spirometer.  You develop fever of 100.5 F (38.1 C) or higher. SEEK IMMEDIATE MEDICAL CARE IF:   You cough up bloody sputum that had not been present before.  You develop fever of 102 F (38.9 C) or greater.  You develop worsening pain at or near the incision site. MAKE SURE YOU:   Understand these instructions.  Will watch your condition.  Will get help right  away if you are not doing well or get worse. Document Released: 11/14/2006 Document Revised: 09/26/2011 Document Reviewed: 01/15/2007 [facility] Patient Information 2014 [place], [place].   ________________________________________________________________________

## 2020-02-04 NOTE — Progress Notes (Addendum)
COVID Vaccine Completed: Date COVID Vaccine completed: COVID vaccine manufacturer: [employer]ler   PCP - Reita Cliche, MD w/ surgical clearance in 12-26-19 office visit  Cardiologist - N/A  No stimulator in back    Chest x-ray - 08-26-19 EKG - 08-27-19 Stress Test -  ECHO -  Cardiac Cath -   Sleep Study -  CPAP -   Fasting Blood Sugar -  Checks Blood Sugar _____ times a day  Blood Thinner Instructions: Aspirin Instructions: Last Dose:  ADL's w/o SOB  Anesthesia review:   Patient denies shortness of breath, fever, cough and chest pain at PAT appointment   Patient verbalized understanding of instructions that were given to them at the PAT appointment. Patient was also instructed that they will need to review over the PAT instructions again at home before surgery.

## 2020-02-06 ENCOUNTER — Other Ambulatory Visit: Payer: Self-pay

## 2020-02-06 ENCOUNTER — Encounter (HOSPITAL_COMMUNITY)
Admission: RE | Admit: 2020-02-06 | Discharge: 2020-02-06 | Disposition: A | Discharge status: Home / Self Care | Payer: Medicare HMO | Source: Ambulatory Visit | Attending: Orthopedic Surgery | Admitting: Orthopedic Surgery

## 2020-02-06 ENCOUNTER — Encounter (HOSPITAL_COMMUNITY): Payer: Self-pay

## 2020-02-06 DIAGNOSIS — Z01818 Encounter for other preprocedural examination: Secondary | ICD-10-CM | POA: Insufficient documentation

## 2020-02-06 DIAGNOSIS — Z01812 Encounter for preprocedural laboratory examination: Secondary | ICD-10-CM | POA: Diagnosis present

## 2020-02-06 LAB — BASIC METABOLIC PANEL WITH GFR
Anion gap: 10 (ref 5–15)
BUN: 25 mg/dL — ABNORMAL HIGH (ref 8–23)
CO2: 26 mmol/L (ref 22–32)
Calcium: 8.2 mg/dL — ABNORMAL LOW (ref 8.9–10.3)
Chloride: 101 mmol/L (ref 98–111)
Creatinine, Ser: 1.37 mg/dL — ABNORMAL HIGH (ref 0.61–1.24)
GFR calc Af Amer: 56 mL/min — ABNORMAL LOW
GFR calc non Af Amer: 49 mL/min — ABNORMAL LOW
Glucose, Bld: 154 mg/dL — ABNORMAL HIGH (ref 70–99)
Potassium: 4.5 mmol/L (ref 3.5–5.1)
Sodium: 137 mmol/L (ref 135–145)

## 2020-02-06 LAB — SURGICAL PCR SCREEN
MRSA, PCR: NEGATIVE
Staphylococcus aureus: NEGATIVE

## 2020-02-06 LAB — CBC
HCT: 38.5 % — ABNORMAL LOW (ref 39.0–52.0)
Hemoglobin: 13.2 g/dL (ref 13.0–17.0)
MCH: 32.5 pg (ref 26.0–34.0)
MCHC: 34.3 g/dL (ref 30.0–36.0)
MCV: 94.8 fL (ref 80.0–100.0)
Platelets: 253 K/uL (ref 150–400)
RBC: 4.06 MIL/uL — ABNORMAL LOW (ref 4.22–5.81)
RDW: 13.3 % (ref 11.5–15.5)
WBC: 7.5 K/uL (ref 4.0–10.5)
nRBC: 0 % (ref 0.0–0.2)

## 2020-02-06 LAB — HEMOGLOBIN A1C
Hgb A1c MFr Bld: 7 % — ABNORMAL HIGH (ref 4.8–5.6)
Mean Plasma Glucose: 154.2 mg/dL

## 2020-02-06 LAB — GLUCOSE, CAPILLARY
Glucose-Capillary: 155 mg/dL — ABNORMAL HIGH (ref 70–99)
Glucose-Capillary: 150 mg/dL — ABNORMAL HIGH (ref 70–99)

## 2020-02-07 NOTE — Progress Notes (Signed)
BMP result routed to Dr. Veverly Fells for review

## 2020-02-11 ENCOUNTER — Other Ambulatory Visit (HOSPITAL_COMMUNITY)
Admission: RE | Admit: 2020-02-11 | Discharge: 2020-02-11 | Disposition: A | Discharge status: Home / Self Care | Payer: Medicare HMO | Source: Ambulatory Visit | Attending: Orthopedic Surgery | Admitting: Orthopedic Surgery

## 2020-02-11 DIAGNOSIS — Z01812 Encounter for preprocedural laboratory examination: Secondary | ICD-10-CM | POA: Insufficient documentation

## 2020-02-11 DIAGNOSIS — Z20822 Contact with and (suspected) exposure to covid-19: Secondary | ICD-10-CM | POA: Insufficient documentation

## 2020-02-11 LAB — SARS CORONAVIRUS 2 (TAT 6-24 HRS): SARS Coronavirus 2: NEGATIVE

## 2020-02-14 ENCOUNTER — Encounter (HOSPITAL_COMMUNITY): Payer: Self-pay | Admitting: Orthopedic Surgery

## 2020-02-14 ENCOUNTER — Observation Stay (HOSPITAL_COMMUNITY)
Admission: RE | Admit: 2020-02-14 | Discharge: 2020-02-15 | Disposition: A | Discharge status: Home / Self Care | Payer: Medicare HMO | Attending: Orthopedic Surgery | Admitting: Orthopedic Surgery

## 2020-02-14 ENCOUNTER — Ambulatory Visit (HOSPITAL_COMMUNITY): Payer: Medicare HMO | Admitting: Certified Registered"

## 2020-02-14 ENCOUNTER — Other Ambulatory Visit: Payer: Self-pay

## 2020-02-14 ENCOUNTER — Observation Stay (HOSPITAL_COMMUNITY): Payer: Medicare HMO

## 2020-02-14 ENCOUNTER — Encounter (HOSPITAL_COMMUNITY)
Admission: RE | Disposition: A | Discharge status: Home / Self Care | Payer: Self-pay | Source: Home / Self Care | Attending: Orthopedic Surgery

## 2020-02-14 DIAGNOSIS — Z96612 Presence of left artificial shoulder joint: Secondary | ICD-10-CM

## 2020-02-14 DIAGNOSIS — Z7984 Long term (current) use of oral hypoglycemic drugs: Secondary | ICD-10-CM | POA: Insufficient documentation

## 2020-02-14 DIAGNOSIS — M75102 Unspecified rotator cuff tear or rupture of left shoulder, not specified as traumatic: Principal | ICD-10-CM | POA: Insufficient documentation

## 2020-02-14 DIAGNOSIS — M25512 Pain in left shoulder: Secondary | ICD-10-CM | POA: Diagnosis present

## 2020-02-14 DIAGNOSIS — Z96651 Presence of right artificial knee joint: Secondary | ICD-10-CM | POA: Insufficient documentation

## 2020-02-14 DIAGNOSIS — Z96642 Presence of left artificial hip joint: Secondary | ICD-10-CM | POA: Diagnosis not present

## 2020-02-14 DIAGNOSIS — Z8546 Personal history of malignant neoplasm of prostate: Secondary | ICD-10-CM | POA: Diagnosis not present

## 2020-02-14 HISTORY — PX: REVERSE SHOULDER ARTHROPLASTY: SHX5054

## 2020-02-14 LAB — GLUCOSE, CAPILLARY: Glucose-Capillary: 141 mg/dL — ABNORMAL HIGH (ref 70–99)

## 2020-02-14 IMAGING — DX DG SHOULDER 1V*L*
1 series · 1 of 1 positions shown · non-contrast
Comparison: Portable exam [6Y] hours without priors for comparison

CLINICAL DATA: Post total shoulder arthroplasty

EXAM:
LEFT SHOULDER

[shoulder ap]
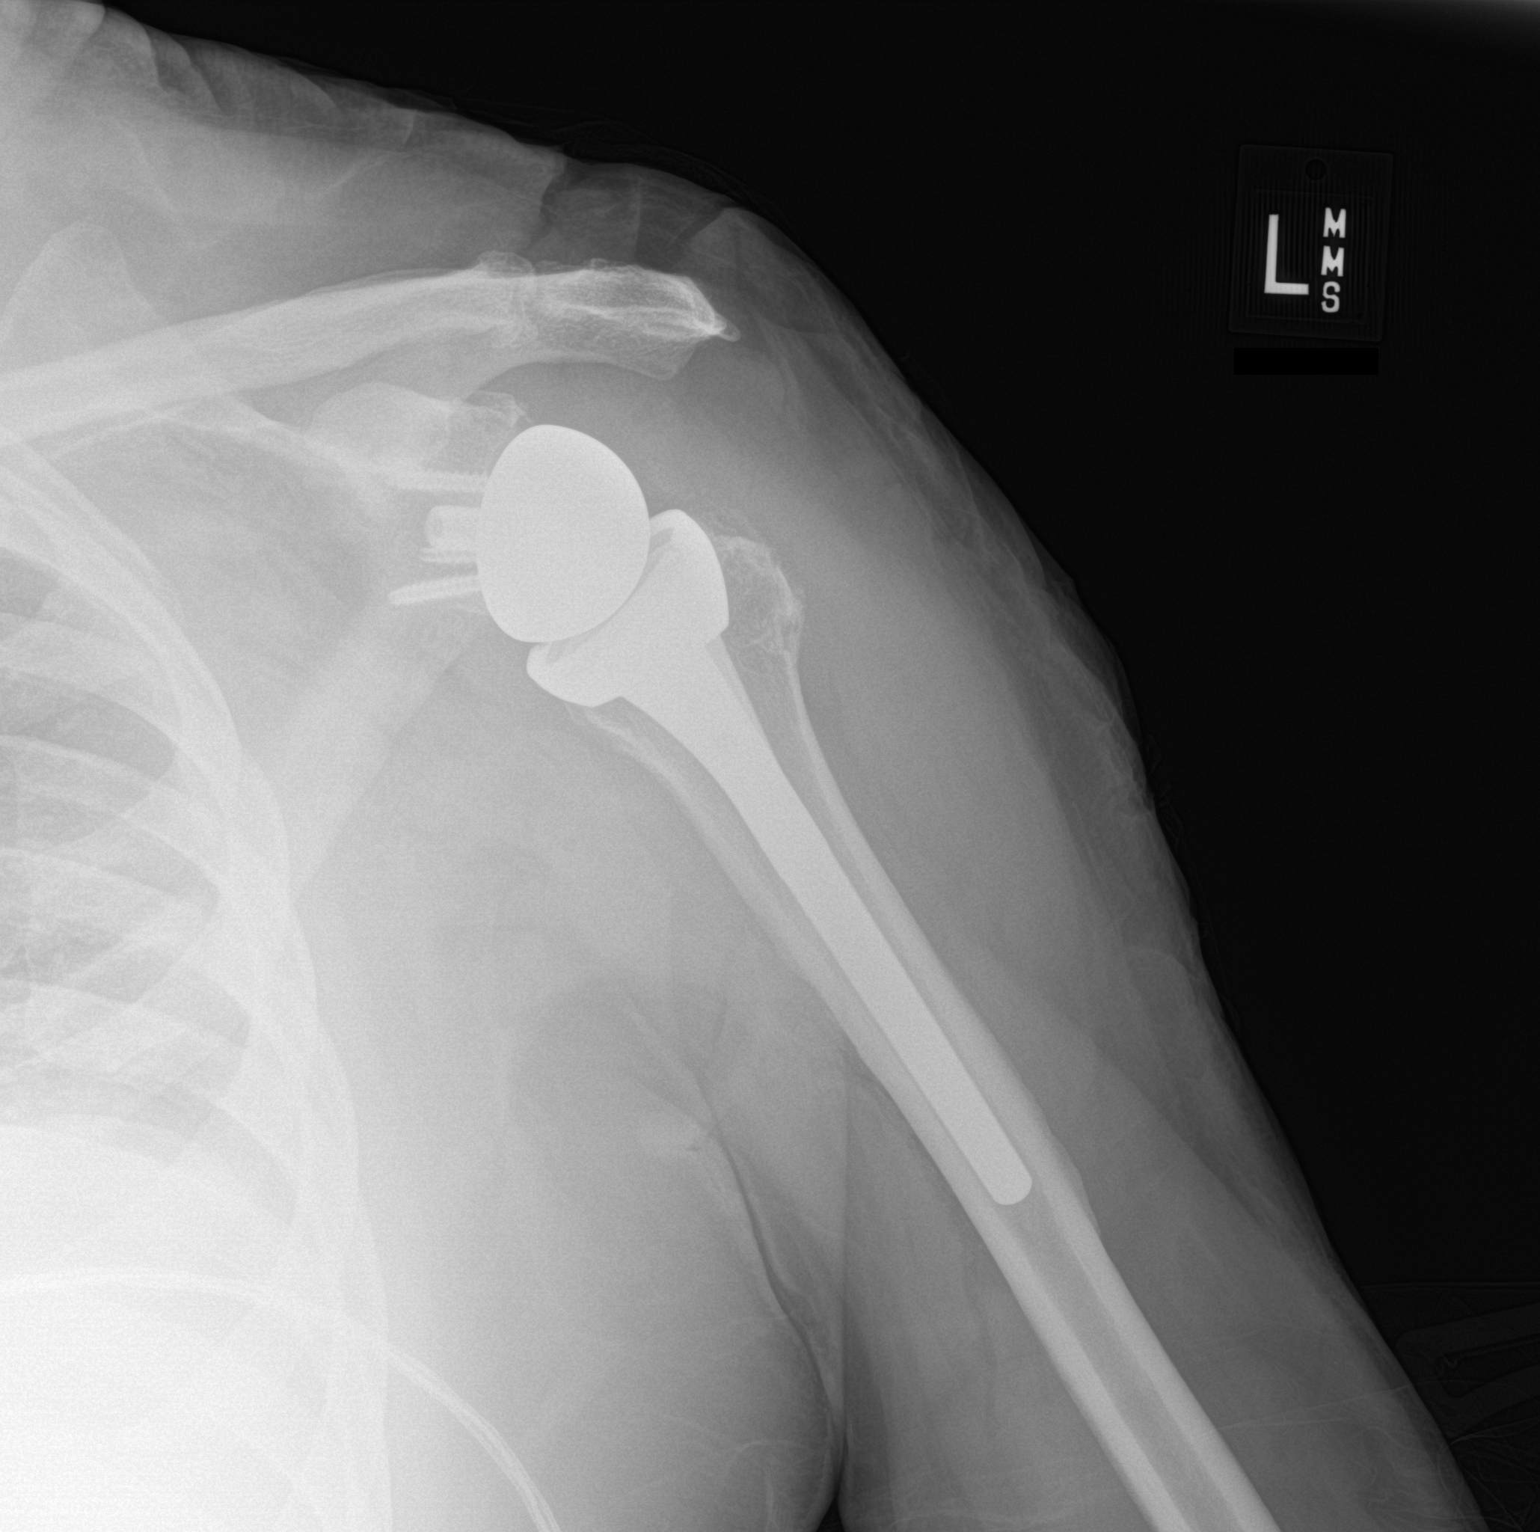

[1 of 1 positions shown; findings below may reference images not displayed]

FINDINGS: Reverse LEFT shoulder prosthesis identified in expected position.

Mild osseous demineralization.

Degenerative changes AC joint.

No fracture, dislocation or bone destruction identified on single AP
view.
IMPRESSION: Reverse LEFT shoulder arthroplasty without acute abnormalities.

## 2020-02-14 SURGERY — ARTHROPLASTY, SHOULDER, TOTAL, REVERSE
Anesthesia: General | Site: Shoulder | Laterality: Left

## 2020-02-14 MED ORDER — SODIUM CHLORIDE 0.9 % IR SOLN
Status: DC | PRN
  Administered 2020-02-14: 1000 mL

## 2020-02-14 MED ORDER — PHENYTOIN SODIUM EXTENDED 100 MG PO CAPS: 100.0000 mg | ORAL_CAPSULE | ORAL | Status: DC

## 2020-02-14 MED ORDER — BUPIVACAINE-EPINEPHRINE (PF) 0.25% -1:200000 IJ SOLN
INTRAMUSCULAR | Status: AC
  Filled 2020-02-14: qty 30

## 2020-02-14 MED ORDER — ROPINIROLE HCL 1 MG PO TABS
3.0000 mg | ORAL_TABLET | Freq: Every day | ORAL | Status: DC
  Administered 2020-02-14: 3 mg via ORAL
  Filled 2020-02-14: qty 3

## 2020-02-14 MED ORDER — PANTOPRAZOLE SODIUM 40 MG PO TBEC
40.0000 mg | DELAYED_RELEASE_TABLET | Freq: Every day | ORAL | Status: DC
  Administered 2020-02-15: 40 mg via ORAL
  Filled 2020-02-14: qty 1

## 2020-02-14 MED ORDER — CEFAZOLIN SODIUM-DEXTROSE 2-4 GM/100ML-% IV SOLN
2.0000 g | Freq: Four times a day (QID) | INTRAVENOUS | Status: AC
  Administered 2020-02-14 – 2020-02-15 (×3): 2 g via INTRAVENOUS
  Filled 2020-02-14 (×3): qty 100

## 2020-02-14 MED ORDER — HYDROMORPHONE HCL 1 MG/ML IJ SOLN
INTRAMUSCULAR | Status: AC
  Filled 2020-02-14: qty 1

## 2020-02-14 MED ORDER — MIDAZOLAM HCL 2 MG/2ML IJ SOLN
INTRAMUSCULAR | Status: AC
  Filled 2020-02-14: qty 2

## 2020-02-14 MED ORDER — HYDROMORPHONE HCL 1 MG/ML IJ SOLN: 0.5000 mg | INTRAMUSCULAR | Status: DC | PRN

## 2020-02-14 MED ORDER — EPHEDRINE SULFATE-NACL 50-0.9 MG/10ML-% IV SOSY
PREFILLED_SYRINGE | INTRAVENOUS | Status: DC | PRN
  Administered 2020-02-14: 15 mg via INTRAVENOUS

## 2020-02-14 MED ORDER — PHENYTOIN SODIUM EXTENDED 100 MG PO CAPS
100.0000 mg | ORAL_CAPSULE | Freq: Every day | ORAL | Status: DC
  Filled 2020-02-14: qty 1

## 2020-02-14 MED ORDER — LIDOCAINE 2% (20 MG/ML) 5 ML SYRINGE
INTRAMUSCULAR | Status: AC
  Filled 2020-02-14: qty 5

## 2020-02-14 MED ORDER — CHLORHEXIDINE GLUCONATE 0.12 % MT SOLN
15.0000 mL | Freq: Once | OROMUCOSAL | Status: AC
  Administered 2020-02-14: 15 mL via OROMUCOSAL

## 2020-02-14 MED ORDER — PHENYLEPHRINE HCL-NACL 10-0.9 MG/250ML-% IV SOLN
INTRAVENOUS | Status: DC | PRN
  Administered 2020-02-14: 40 ug/min via INTRAVENOUS

## 2020-02-14 MED ORDER — GABAPENTIN 100 MG PO CAPS
100.0000 mg | ORAL_CAPSULE | Freq: Three times a day (TID) | ORAL | Status: DC
  Administered 2020-02-14 – 2020-02-15 (×3): 100 mg via ORAL
  Filled 2020-02-14 (×3): qty 1

## 2020-02-14 MED ORDER — METOCLOPRAMIDE HCL 5 MG/ML IJ SOLN: 5.0000 mg | Freq: Three times a day (TID) | INTRAMUSCULAR | Status: DC | PRN

## 2020-02-14 MED ORDER — OXYCODONE HCL 5 MG PO TABS
5.0000 mg | ORAL_TABLET | Freq: Four times a day (QID) | ORAL | Status: DC | PRN
  Administered 2020-02-14: 5 mg via ORAL
  Filled 2020-02-14: qty 1

## 2020-02-14 MED ORDER — HYDROMORPHONE HCL 1 MG/ML IJ SOLN
0.2500 mg | INTRAMUSCULAR | Status: DC | PRN
  Administered 2020-02-14: 0.5 mg via INTRAVENOUS

## 2020-02-14 MED ORDER — ACETAMINOPHEN 325 MG PO TABS: 325.0000 mg | ORAL_TABLET | Freq: Four times a day (QID) | ORAL | Status: DC | PRN

## 2020-02-14 MED ORDER — SODIUM CHLORIDE 0.9 % IV SOLN
INTRAVENOUS | Status: DC
  Administered 2020-02-14: 50 mL/h via INTRAVENOUS

## 2020-02-14 MED ORDER — BISACODYL 10 MG RE SUPP: 10.0000 mg | Freq: Every day | RECTAL | Status: DC | PRN

## 2020-02-14 MED ORDER — LIDOCAINE 2% (20 MG/ML) 5 ML SYRINGE
INTRAMUSCULAR | Status: DC | PRN
  Administered 2020-02-14: 60 mg via INTRAVENOUS

## 2020-02-14 MED ORDER — METHOCARBAMOL 500 MG PO TABS
500.0000 mg | ORAL_TABLET | Freq: Four times a day (QID) | ORAL | Status: DC | PRN
  Administered 2020-02-14 (×2): 500 mg via ORAL
  Filled 2020-02-14 (×3): qty 1

## 2020-02-14 MED ORDER — FENTANYL CITRATE (PF) 100 MCG/2ML IJ SOLN
INTRAMUSCULAR | Status: DC | PRN
  Administered 2020-02-14: 50 ug via INTRAVENOUS

## 2020-02-14 MED ORDER — EPHEDRINE 5 MG/ML INJ
INTRAVENOUS | Status: AC
  Filled 2020-02-14: qty 10

## 2020-02-14 MED ORDER — BUPIVACAINE-EPINEPHRINE (PF) 0.25% -1:200000 IJ SOLN
INTRAMUSCULAR | Status: DC | PRN
  Administered 2020-02-14: 15 mL

## 2020-02-14 MED ORDER — SUCCINYLCHOLINE CHLORIDE 200 MG/10ML IV SOSY
PREFILLED_SYRINGE | INTRAVENOUS | Status: DC | PRN
  Administered 2020-02-14: 140 mg via INTRAVENOUS

## 2020-02-14 MED ORDER — CEFAZOLIN SODIUM-DEXTROSE 2-4 GM/100ML-% IV SOLN
2.0000 g | INTRAVENOUS | Status: AC
  Administered 2020-02-14: 2 g via INTRAVENOUS
  Filled 2020-02-14: qty 100

## 2020-02-14 MED ORDER — OXYCODONE HCL 5 MG PO TABS: 5.0000 mg | ORAL_TABLET | Freq: Four times a day (QID) | ORAL | 0 refills | Status: DC | PRN

## 2020-02-14 MED ORDER — ONDANSETRON HCL 4 MG PO TABS: 4.0000 mg | ORAL_TABLET | Freq: Four times a day (QID) | ORAL | Status: DC | PRN

## 2020-02-14 MED ORDER — ORAL CARE MOUTH RINSE: 15.0000 mL | Freq: Once | OROMUCOSAL | Status: AC

## 2020-02-14 MED ORDER — DEXAMETHASONE SODIUM PHOSPHATE 10 MG/ML IJ SOLN
INTRAMUSCULAR | Status: AC
  Filled 2020-02-14: qty 1

## 2020-02-14 MED ORDER — BUPIVACAINE HCL (PF) 0.5 % IJ SOLN
INTRAMUSCULAR | Status: DC | PRN
Start: 2020-02-14 — End: 2020-02-14
  Administered 2020-02-14: 20 mL

## 2020-02-14 MED ORDER — ONDANSETRON HCL 4 MG/2ML IJ SOLN
INTRAMUSCULAR | Status: AC
  Filled 2020-02-14: qty 2

## 2020-02-14 MED ORDER — MIDAZOLAM HCL 2 MG/2ML IJ SOLN
1.0000 mg | INTRAMUSCULAR | Status: AC
  Administered 2020-02-14: 2 mg via INTRAVENOUS
  Filled 2020-02-14: qty 2

## 2020-02-14 MED ORDER — LIP MEDEX EX OINT
TOPICAL_OINTMENT | CUTANEOUS | Status: AC
  Filled 2020-02-14: qty 7

## 2020-02-14 MED ORDER — ZOLPIDEM TARTRATE 5 MG PO TABS: 5.0000 mg | ORAL_TABLET | Freq: Every evening | ORAL | Status: DC | PRN

## 2020-02-14 MED ORDER — FENTANYL CITRATE (PF) 100 MCG/2ML IJ SOLN
50.0000 ug | INTRAMUSCULAR | Status: AC
  Administered 2020-02-14: 100 ug via INTRAVENOUS
  Filled 2020-02-14: qty 2

## 2020-02-14 MED ORDER — AMOXICILLIN 500 MG PO TABS: 2000.0000 mg | ORAL_TABLET | ORAL | Status: DC

## 2020-02-14 MED ORDER — MENTHOL 3 MG MT LOZG: 1.0000 | LOZENGE | OROMUCOSAL | Status: DC | PRN

## 2020-02-14 MED ORDER — POLYETHYLENE GLYCOL 3350 17 G PO PACK: 17.0000 g | PACK | Freq: Every day | ORAL | Status: DC | PRN

## 2020-02-14 MED ORDER — ROPINIROLE HCL 1 MG PO TABS: 3.0000 mg | ORAL_TABLET | Freq: Every day | ORAL | Status: DC

## 2020-02-14 MED ORDER — BUPIVACAINE LIPOSOME 1.3 % IJ SUSP
INTRAMUSCULAR | Status: DC | PRN
Start: 2020-02-14 — End: 2020-02-14
  Administered 2020-02-14: 10 mL via PERINEURAL

## 2020-02-14 MED ORDER — ONDANSETRON HCL 4 MG/2ML IJ SOLN
INTRAMUSCULAR | Status: DC | PRN
  Administered 2020-02-14: 4 mg via INTRAVENOUS

## 2020-02-14 MED ORDER — STERILE WATER FOR IRRIGATION IR SOLN
Status: DC | PRN
  Administered 2020-02-14 (×2): 1000 mL

## 2020-02-14 MED ORDER — PHENYTOIN SODIUM EXTENDED 100 MG PO CAPS
200.0000 mg | ORAL_CAPSULE | Freq: Every day | ORAL | Status: DC
  Administered 2020-02-14: 200 mg via ORAL
  Filled 2020-02-14: qty 2

## 2020-02-14 MED ORDER — PROPOFOL 10 MG/ML IV BOLUS
INTRAVENOUS | Status: AC
  Filled 2020-02-14: qty 20

## 2020-02-14 MED ORDER — DEXAMETHASONE SODIUM PHOSPHATE 10 MG/ML IJ SOLN
INTRAMUSCULAR | Status: DC | PRN
  Administered 2020-02-14: 8 mg via INTRAVENOUS

## 2020-02-14 MED ORDER — ONDANSETRON HCL 4 MG PO TABS: 4.0000 mg | ORAL_TABLET | Freq: Three times a day (TID) | ORAL | 0 refills | Status: AC | PRN

## 2020-02-14 MED ORDER — SUCCINYLCHOLINE CHLORIDE 200 MG/10ML IV SOSY
PREFILLED_SYRINGE | INTRAVENOUS | Status: AC
  Filled 2020-02-14: qty 10

## 2020-02-14 MED ORDER — PHENYLEPHRINE HCL (PRESSORS) 10 MG/ML IV SOLN
INTRAVENOUS | Status: AC
  Filled 2020-02-14: qty 1

## 2020-02-14 MED ORDER — FUROSEMIDE 20 MG PO TABS: 20.0000 mg | ORAL_TABLET | Freq: Every day | ORAL | Status: DC | PRN

## 2020-02-14 MED ORDER — LACTATED RINGERS IV SOLN: INTRAVENOUS | Status: DC

## 2020-02-14 MED ORDER — METFORMIN HCL ER 500 MG PO TB24
500.0000 mg | ORAL_TABLET | Freq: Every day | ORAL | Status: DC
  Administered 2020-02-14: 500 mg via ORAL
  Filled 2020-02-14: qty 1

## 2020-02-14 MED ORDER — FENTANYL CITRATE (PF) 100 MCG/2ML IJ SOLN
INTRAMUSCULAR | Status: AC
  Filled 2020-02-14: qty 2

## 2020-02-14 MED ORDER — METOCLOPRAMIDE HCL 5 MG PO TABS: 5.0000 mg | ORAL_TABLET | Freq: Three times a day (TID) | ORAL | Status: DC | PRN

## 2020-02-14 MED ORDER — PHENOL 1.4 % MT LIQD: 1.0000 | OROMUCOSAL | Status: DC | PRN

## 2020-02-14 MED ORDER — ATORVASTATIN CALCIUM 10 MG PO TABS
10.0000 mg | ORAL_TABLET | Freq: Every day | ORAL | Status: DC
  Administered 2020-02-15: 10 mg via ORAL
  Filled 2020-02-14: qty 1

## 2020-02-14 MED ORDER — PROPOFOL 10 MG/ML IV BOLUS
INTRAVENOUS | Status: DC | PRN
  Administered 2020-02-14: 130 mg via INTRAVENOUS

## 2020-02-14 MED ORDER — DOCUSATE SODIUM 100 MG PO CAPS
100.0000 mg | ORAL_CAPSULE | Freq: Two times a day (BID) | ORAL | Status: DC
  Administered 2020-02-14: 100 mg via ORAL
  Filled 2020-02-14: qty 1

## 2020-02-14 MED ORDER — ROPINIROLE HCL 1 MG PO TABS
1.0000 mg | ORAL_TABLET | Freq: Three times a day (TID) | ORAL | Status: DC
  Administered 2020-02-14 – 2020-02-15 (×2): 1 mg via ORAL
  Filled 2020-02-14 (×2): qty 1

## 2020-02-14 MED ORDER — ONDANSETRON HCL 4 MG/2ML IJ SOLN: 4.0000 mg | Freq: Four times a day (QID) | INTRAMUSCULAR | Status: DC | PRN

## 2020-02-14 SURGICAL SUPPLY — 77 items
AID PSTN UNV HD RSTRNT DISP (MISCELLANEOUS) ×1
BAG SPEC THK2 15X12 ZIP CLS (MISCELLANEOUS)
BAG ZIPLOCK 12X15 (MISCELLANEOUS) IMPLANT
BIT DRILL 1.6MX128 (BIT) IMPLANT
BIT DRILL 1.6MX128MM (BIT)
BIT DRILL 170X2.5X (BIT) IMPLANT
BIT DRL 170X2.5X (BIT) ×1
BLADE SAG 18X100X1.27 (BLADE) ×3 IMPLANT
CLOSURE STERI-STRIP 1/2X4 (GAUZE/BANDAGES/DRESSINGS) ×1
CLOSURE WOUND 1/2 X4 (GAUZE/BANDAGES/DRESSINGS) ×1
CLSR STERI-STRIP ANTIMIC 1/2X4 (GAUZE/BANDAGES/DRESSINGS) ×1 IMPLANT
COVER BACK TABLE 60X90IN (DRAPES) ×3 IMPLANT
COVER SURGICAL LIGHT HANDLE (MISCELLANEOUS) ×3 IMPLANT
COVER WAND RF STERILE (DRAPES) IMPLANT
DECANTER SPIKE VIAL GLASS SM (MISCELLANEOUS) ×3 IMPLANT
DRAPE INCISE IOBAN 66X45 STRL (DRAPES) ×3 IMPLANT
DRAPE ORTHO SPLIT 77X108 STRL (DRAPES) ×6
DRAPE SHEET LG 3/4 BI-LAMINATE (DRAPES) ×3 IMPLANT
DRAPE SURG ORHT 6 SPLT 77X108 (DRAPES) ×2 IMPLANT
DRAPE TOP 10253 STERILE (DRAPES) ×3 IMPLANT
DRAPE U-SHAPE 47X51 STRL (DRAPES) ×3 IMPLANT
DRILL 2.5 (BIT) ×3
DRSG ADAPTIC 3X8 NADH LF (GAUZE/BANDAGES/DRESSINGS) ×3 IMPLANT
DRSG PAD ABDOMINAL 8X10 ST (GAUZE/BANDAGES/DRESSINGS) ×3 IMPLANT
DURAPREP 26ML APPLICATOR (WOUND CARE) ×3 IMPLANT
ELECT BLADE TIP CTD 4 INCH (ELECTRODE) ×3 IMPLANT
ELECT NDL TIP 2.8 STRL (NEEDLE) ×1 IMPLANT
ELECT NEEDLE TIP 2.8 STRL (NEEDLE) ×3 IMPLANT
ELECT REM PT RETURN 15FT ADLT (MISCELLANEOUS) ×3 IMPLANT
EPI LT SZ 1 (Orthopedic Implant) ×3 IMPLANT
EPIPHYSIS LT SZ 1 (Orthopedic Implant) IMPLANT
GAUZE SPONGE 4X4 12PLY STRL (GAUZE/BANDAGES/DRESSINGS) ×3 IMPLANT
GLENOSPHERE XTEND RSA 38 SD +4 (Joint) ×2 IMPLANT
GLOVE BIOGEL PI ORTHO PRO 7.5 (GLOVE) ×2
GLOVE BIOGEL PI ORTHO PRO SZ8 (GLOVE) ×2
GLOVE ORTHO TXT STRL SZ7.5 (GLOVE) ×3 IMPLANT
GLOVE PI ORTHO PRO STRL 7.5 (GLOVE) ×1 IMPLANT
GLOVE PI ORTHO PRO STRL SZ8 (GLOVE) ×1 IMPLANT
GLOVE SURG ORTHO 8.5 STRL (GLOVE) ×3 IMPLANT
GOWN STRL REUS W/TWL XL LVL3 (GOWN DISPOSABLE) ×6 IMPLANT
KIT BASIN OR (CUSTOM PROCEDURE TRAY) ×3 IMPLANT
KIT TURNOVER KIT A (KITS) IMPLANT
MANIFOLD NEPTUNE II (INSTRUMENTS) ×3 IMPLANT
METAGLENE DELTA EXTEND (Trauma) IMPLANT
METAGLENE DXTEND (Trauma) ×3 IMPLANT
NDL MAYO CATGUT SZ4 TPR NDL (NEEDLE) IMPLANT
NEEDLE MAYO CATGUT SZ4 (NEEDLE) IMPLANT
NS IRRIG 1000ML POUR BTL (IV SOLUTION) ×3 IMPLANT
PACK SHOULDER (CUSTOM PROCEDURE TRAY) ×3 IMPLANT
PENCIL SMOKE EVACUATOR (MISCELLANEOUS) IMPLANT
PIN GUIDE 1.2 (PIN) ×2 IMPLANT
PIN GUIDE GLENOPHERE 1.5MX300M (PIN) ×2 IMPLANT
PIN METAGLENE 2.5 (PIN) ×2 IMPLANT
PROTECTOR NERVE ULNAR (MISCELLANEOUS) ×3 IMPLANT
RESTRAINT HEAD UNIVERSAL NS (MISCELLANEOUS) ×3 IMPLANT
SCREW 4.5X18MM (Screw) ×6 IMPLANT
SCREW 4.5X36MM (Screw) ×4 IMPLANT
SCREW BN 18X4.5XSTRL SHLDR (Screw) IMPLANT
SCREW LOCK DELTA XTEND 4.5X30 (Screw) ×2 IMPLANT
SLING ARM FOAM STRAP LRG (SOFTGOODS) IMPLANT
SMARTMIX MINI TOWER (MISCELLANEOUS)
SPACER 38 PLUS 3 (Spacer) ×2 IMPLANT
SPONGE LAP 4X18 RFD (DISPOSABLE) IMPLANT
STEM DELTA DIA 10 HA (Stem) ×2 IMPLANT
STRIP CLOSURE SKIN 1/2X4 (GAUZE/BANDAGES/DRESSINGS) ×2 IMPLANT
SUCTION FRAZIER HANDLE 10FR (MISCELLANEOUS) ×3
SUCTION TUBE FRAZIER 10FR DISP (MISCELLANEOUS) ×1 IMPLANT
SUT FIBERWIRE #2 38 T-5 BLUE (SUTURE) ×6
SUT MNCRL AB 4-0 PS2 18 (SUTURE) ×3 IMPLANT
SUT VIC AB 0 CT1 36 (SUTURE) ×6 IMPLANT
SUT VIC AB 0 CT2 27 (SUTURE) ×3 IMPLANT
SUT VIC AB 2-0 CT1 27 (SUTURE) ×3
SUT VIC AB 2-0 CT1 TAPERPNT 27 (SUTURE) ×1 IMPLANT
SUTURE FIBERWR #2 38 T-5 BLUE (SUTURE) ×2 IMPLANT
TOWEL OR 17X26 10 PK STRL BLUE (TOWEL DISPOSABLE) ×3 IMPLANT
TOWER SMARTMIX MINI (MISCELLANEOUS) IMPLANT
YANKAUER SUCT BULB TIP 10FT TU (MISCELLANEOUS) ×3 IMPLANT

## 2020-02-14 NOTE — Op Note (Signed)
NAME: THEON, SOBOTKA MEDICAL RECORD ZO:[id number] ACCOUNT [id number] DATE OF BIRTH:12-30-1940 FACILITY: [facility]ATION: [facility] PHYSICIAN:STEVEN Orlena Sheldon, MD  OPERATIVE REPORT  DATE OF PROCEDURE:  02/14/2020  PREOPERATIVE DIAGNOSIS:  Left shoulder rotator cuff tear arthropathy.  POSTOPERATIVE DIAGNOSIS:  Left shoulder rotator cuff tear arthropathy.  PROCEDURE PERFORMED:  Left reverse total shoulder replacement using DePuy Delta Xtend prosthesis with no subscapularis repair.  ATTENDING SURGEON:  Esmond Plants, MD  ASSISTANT:  Darol Destine, [place], who was scrubbed during the entire procedure and necessary for satisfactory completion of surgery.  ANESTHESIA:  General anesthesia was used plus interscalene block.  ESTIMATED BLOOD LOSS:  100 mL.  FLUID REPLACEMENT:  1500 mL crystalloid.  INSTRUMENT COUNTS:  Correct.  COMPLICATIONS:  No complications.  ANTIBIOTICS:  Perioperative antibiotics were given.  INDICATIONS:  The patient is a 79 year old male with worsening left shoulder pain and dysfunction secondary to end-stage rotator cuff tear arthropathy.  The patient has failed conservative management, desires operative treatment to restore function and  eliminate pain.  Informed consent obtained.  DESCRIPTION OF PROCEDURE:  After an adequate level of anesthesia was achieved, the patient was positioned in the modified beach chair position.  Left shoulder correctly identified and sterilely prepped and draped in the usual manner.  A time-out called,  verifying correct patient and correct site.  We entered the patient's shoulder using a standard deltopectoral incision, starting at the coracoid process, extending down to the anterior humerus, dissection down through subcutaneous tissues using Bovie  electrocautery.  We identified the cephalic vein and took that laterally with the deltoid, pectoralis taken medially.  Conjoined tendon identified and retracted medially.  Deep retractors  were placed.  We did scar release under the deltoid and freed up  the proximal humerus.  The subscapularis remnant was released off the lesser tuberosity and tagged for protection of the axillary nerve and retraction.  We released the inferior capsule, progressively externally rotating and delivering the humeral head  out of the wound.  The humeral head was completely devoid of cartilage.  We entered the proximal humerus with a 6 mm reamer, reaming up to a size 10.  We then placed our 10 mm T-handled intramedullary resection guide and resected the head at 10 degrees  of retroversion for this left shoulder with the oscillating saw.  We removed excess osteophytes with a rongeur.  We saved the head cut for bone graft.  At this point, we subluxed the humerus posteriorly.  We then did an excision of the patient's remnant  of the rotator cuff.  We also excised the capsule and the labrum and removed a large bone spur from the posterior glenoid.  At this point, we had a good exposure of our glenoid and centering low, we placed our guide pin for the metaglene baseplate  preparation.  We then reamed for the metaglene baseplate, getting good bony support.  We then drilled our central peg hole and impacted the metaglene into position.  We placed a 36 screw inferiorly, a 30 screw superiorly, both locked and an 18 nonlocked  anteriorly and posteriorly for good baseplate security.  We then took the 38+4 standard glenosphere and attached that to the metaglene baseplate with a central screw, did a finger sweep and made sure that we had good clearance inferiorly and there was no  soft tissue incorporated in the bearing.  At this point, we then completed our preparation on the humeral side with the size 1 centered.  We went ahead and  set that at 10 degrees of retroversion and reamed for the metaphysis.  We then placed the 10 body  with the 1 left metaphysis set on the 0 setting and placed in 10 degrees of retroversion and  impacted that in position.  It gave good bony coverage and then we used a 38+3 standard poly and reduced the shoulder.  We were pleased with our soft tissue  balancing, removed all trial components from the humeral side, irrigated thoroughly and then used impaction grafting technique with available bone graft from the humeral head and impacted the HA coated press-fit stem, size 10 and the 1 left metaphysis,  again set on the 0 setting and impacted in 10 degrees of retroversion.  We then selected the 38+3 real poly and impacted that on the humeral tray.  We reduced the shoulder.  We were pleased with our soft tissue balancing and stability.  No gapping with  inferior pole, external rotation and we had appropriate conjoined tensioned.  We irrigated thoroughly again and then the subscap remnant.  We then repaired deltopectoral interval with 0 Vicryl suture, followed by 2-0 Vicryl for subcutaneous closure and  4-0 Monocryl for skin and portals.  Steri-Strips were applied, followed by sterile dressing.  The patient tolerated the procedure well.  VN/NUANCE  D:02/14/2020 T:02/14/2020 JOB:[id number]

## 2020-02-14 NOTE — Anesthesia Preprocedure Evaluation (Signed)
Anesthesia Evaluation  Patient identified by MRN, date of birth, ID band Patient awake    Reviewed: Allergy & Precautions, NPO status , Patient's Chart, lab work & pertinent test results  Airway Mallampati: II  TM Distance: <3 FB Neck ROM: Full    Dental no notable dental hx.    Pulmonary sleep apnea and Continuous Positive Airway Pressure Ventilation ,    Pulmonary exam normal breath sounds clear to auscultation       Cardiovascular negative cardio ROS Normal cardiovascular exam Rhythm:Regular Rate:Normal     Neuro/Psych negative neurological ROS  negative psych ROS   GI/Hepatic Neg liver ROS, GERD  ,  Endo/Other  Morbid obesity  Renal/GU negative Renal ROS  negative genitourinary   Musculoskeletal negative musculoskeletal ROS (+)   Abdominal   Peds negative pediatric ROS (+)  Hematology negative hematology ROS (+)   Anesthesia Other Findings   Reproductive/Obstetrics negative OB ROS                             Anesthesia Physical Anesthesia Plan  ASA: III  Anesthesia Plan: General   Post-op Pain Management:  Regional for Post-op pain   Induction: Intravenous  PONV Risk Score and Plan: 2 and Ondansetron, Dexamethasone and Treatment may vary due to age or medical condition  Airway Management Planned: Oral ETT  Additional Equipment:   Intra-op Plan:   Post-operative Plan: Extubation in OR  Informed Consent: I have reviewed the patients History and Physical, chart, labs and discussed the procedure including the risks, benefits and alternatives for the proposed anesthesia with the patient or authorized representative who has indicated his/her understanding and acceptance.     Dental advisory given  Plan Discussed with: CRNA and Surgeon  Anesthesia Plan Comments:         Anesthesia Quick Evaluation

## 2020-02-14 NOTE — Anesthesia Postprocedure Evaluation (Signed)
Anesthesia Post Note  Patient: Manuel Hunter  Procedure(s) Performed: REVERSE SHOULDER ARTHROPLASTY (Left Shoulder)     Patient location during evaluation: PACU Anesthesia Type: General Level of consciousness: awake and alert Pain management: pain level controlled Vital Signs Assessment: post-procedure vital signs reviewed and stable Respiratory status: spontaneous breathing, nonlabored ventilation, respiratory function stable and patient connected to nasal cannula oxygen Cardiovascular status: blood pressure returned to baseline and stable Postop Assessment: no apparent nausea or vomiting Anesthetic complications: no   No complications documented.  Last Vitals:  Vitals:   02/14/20 1245 02/14/20 1300  BP: (!) 131/59 (!) 137/63  Pulse: 73 76  Resp: 18 17  Temp:    SpO2: 96% 93%    Last Pain:  Vitals:   02/14/20 1300  TempSrc:   PainSc: 5                  Ghada Abbett S

## 2020-02-14 NOTE — Brief Op Note (Signed)
02/14/2020  12:06 PM  PATIENT:  Manuel Hunter  79 y.o. male  PRE-OPERATIVE DIAGNOSIS:  Left shoulder cuff tear arthropathy  POST-OPERATIVE DIAGNOSIS:  Left shoulder cuff tear arthropathy  PROCEDURE:  Procedure(s) with comments: REVERSE SHOULDER ARTHROPLASTY (Left) - Interscalene block DePuy Delta Xtend - NO subscap repair  SURGEON:  Surgeon(s) and Role:    Netta Cedars, MD - Primary  PHYSICIAN ASSISTANT:   ASSISTANTS: [place] Bruns, PA-C   ANESTHESIA:   regional and general  EBL:  125 mL   BLOOD ADMINISTERED:none  DRAINS: none   LOCAL MEDICATIONS USED:  MARCAINE     SPECIMEN:  No Specimen  DISPOSITION OF SPECIMEN:  N/A  COUNTS:  YES  TOURNIQUET:  * No tourniquets in log *  DICTATION: .Other Dictation: Dictation Number [id number]  PLAN OF CARE: Admit for overnight observation  PATIENT DISPOSITION:  PACU - hemodynamically stable.   Delay start of Pharmacological VTE agent (>24hrs) due to surgical blood loss or risk of bleeding: not applicable

## 2020-02-14 NOTE — Anesthesia Postprocedure Evaluation (Signed)
Anesthesia Post Note  Patient: Manuel Hunter  Procedure(s) Performed: REVERSE SHOULDER ARTHROPLASTY (Left Shoulder)     Patient location during evaluation: PACU Anesthesia Type: General Level of consciousness: awake and alert Pain management: pain level controlled Vital Signs Assessment: post-procedure vital signs reviewed and stable Respiratory status: spontaneous breathing, nonlabored ventilation, respiratory function stable and patient connected to nasal cannula oxygen Cardiovascular status: blood pressure returned to baseline and stable Postop Assessment: no apparent nausea or vomiting Anesthetic complications: no   No complications documented.  Last Vitals:  Vitals:   02/14/20 1245 02/14/20 1300  BP: (!) 131/59 (!) 137/63  Pulse: 73 76  Resp: 18 17  Temp:    SpO2: 96% 93%    Last Pain:  Vitals:   02/14/20 1300  TempSrc:   PainSc: 5                  [name]

## 2020-02-14 NOTE — Anesthesia Procedure Notes (Signed)
Anesthesia Procedure Image    

## 2020-02-14 NOTE — Anesthesia Procedure Notes (Signed)
Anesthesia Regional Block: Interscalene brachial plexus block   Pre-Anesthetic Checklist: ,, timeout performed, Correct Patient, Correct Site, Correct Laterality, Correct Procedure, Correct Position, site marked, Risks and benefits discussed,  Surgical consent,  Pre-op evaluation,  At surgeon's request and post-op pain management  Laterality: Left  Prep: chloraprep       Needles:  Injection technique: Single-shot  Needle Type: Echogenic Needle     Needle Length: 9cm      Additional Needles:   Procedures:,,,, ultrasound used (permanent image in chart),,,,  Narrative:  Start time: 02/14/2020 9:13 AM End time: 02/14/2020 9:20 AM Injection made incrementally with aspirations every 5 mL.  Performed by: Personally  Anesthesiologist: Myrtie Soman, MD  Additional Notes: Patient tolerated the procedure well without complications

## 2020-02-14 NOTE — Anesthesia Procedure Notes (Signed)
Procedure Name: Intubation Date/Time: 02/14/2020 10:14 AM Performed by: Niel Hummer, CRNA Pre-anesthesia Checklist: Patient identified, Emergency Drugs available, Suction available and Patient being monitored Patient Re-evaluated:Patient Re-evaluated prior to induction Oxygen Delivery Method: Circle system utilized Preoxygenation: Pre-oxygenation with 100% oxygen Induction Type: IV induction Ventilation: Mask ventilation without difficulty and Oral airway inserted - appropriate to patient size Laryngoscope Size: Mac and 4 Grade View: Grade II Tube type: Oral Tube size: 7.5 mm Number of attempts: 1 Airway Equipment and Method: Stylet Placement Confirmation: ETT inserted through vocal cords under direct vision,  positive ETCO2 and breath sounds checked- equal and bilateral Secured at: 22 cm Tube secured with: Tape Dental Injury: Teeth and Oropharynx as per pre-operative assessment

## 2020-02-14 NOTE — Interval H&P Note (Signed)
History and Physical Interval Note:  02/14/2020 9:27 AM  Manuel Hunter  has presented today for surgery, with the diagnosis of Left shoulder cuff arthropathy.  The various methods of treatment have been discussed with the patient and family. After consideration of risks, benefits and other options for treatment, the patient has consented to  Procedure(s) with comments: REVERSE SHOULDER ARTHROPLASTY (Left) - Interscalene block as a surgical intervention.  The patient's history has been reviewed, patient examined, no change in status, stable for surgery.  I have reviewed the patient's chart and labs.  Questions were answered to the patient's satisfaction.     [name]

## 2020-02-14 NOTE — Progress Notes (Signed)
Assisted Dr. Kalman Shan with left, ultrasound guided, interscalene  block. Side rails up, monitors on throughout procedure. See vital signs in flow sheet. Tolerated Procedure well.

## 2020-02-14 NOTE — Discharge Instructions (Signed)
Ice to the shoulder constantly.  Keep the incision covered and clean and dry for one week, then ok to get it wet in the shower. ° °Do exercise as instructed several times per day. ° °DO NOT reach behind your back or push up out of a chair with the operative arm. ° °Use a sling while you are up and around for comfort, may remove while seated.  Keep pillow propped behind the operative elbow. ° °Follow up with Dr Vallie Teters in two weeks in the office, call [phone] for appt °

## 2020-02-14 NOTE — Transfer of Care (Signed)
Immediate Anesthesia Transfer of Care Note  Patient: Manuel Hunter  Procedure(s) Performed: REVERSE SHOULDER ARTHROPLASTY (Left Shoulder)  Patient Location: PACU  Anesthesia Type:General  Level of Consciousness: sedated  Airway & Oxygen Therapy: Patient Spontanous Breathing and Patient connected to face mask oxygen  Post-op Assessment: Report given to RN and Post -op Vital signs reviewed and stable  Post vital signs: Reviewed and stable  Last Vitals:  Vitals Value Taken Time  BP 158/98 02/14/20 1200  Temp    Pulse 74 02/14/20 1202  Resp 14 02/14/20 1202  SpO2 98 % 02/14/20 1202  Vitals shown include unvalidated device data.  Last Pain:  Vitals:   02/14/20 0804  TempSrc: Oral      Patients Stated Pain Goal: 4 (53/66/44 0347)  Complications: No complications documented.

## 2020-02-15 LAB — BASIC METABOLIC PANEL
Anion gap: 10 (ref 5–15)
BUN: 16 mg/dL (ref 8–23)
CO2: 23 mmol/L (ref 22–32)
Calcium: 8.2 mg/dL — ABNORMAL LOW (ref 8.9–10.3)
Chloride: 104 mmol/L (ref 98–111)
Creatinine, Ser: 0.89 mg/dL (ref 0.61–1.24)
GFR calc Af Amer: >60 mL/min (ref >60)
GFR calc non Af Amer: >60 mL/min (ref >60)
Glucose, Bld: 159 mg/dL — ABNORMAL HIGH (ref 70–99)
Potassium: 3.7 mmol/L (ref 3.5–5.1)
Sodium: 137 mmol/L (ref 135–145)

## 2020-02-15 LAB — HEMOGLOBIN AND HEMATOCRIT, BLOOD
HCT: 35.9 % — ABNORMAL LOW (ref 39.0–52.0)
Hemoglobin: 12.3 g/dL — ABNORMAL LOW (ref 13.0–17.0)

## 2020-02-15 MED ORDER — OXYCODONE HCL 5 MG PO TABS: 5.0000 mg | ORAL_TABLET | Freq: Four times a day (QID) | ORAL | 0 refills | Status: DC | PRN

## 2020-02-15 NOTE — Progress Notes (Signed)
Subjective: 1 Day Post-Op Procedure(s) (LRB): REVERSE SHOULDER ARTHROPLASTY (Left) Patient reports pain as mild.  He reports that he is comfortable this morning.  He used his CPAP last night.  He is in a sling as well.  His vitals are stable and his H/H is stable.  He tolerated the surgery very well yesterday.  Objective: Vital signs in last 24 hours: Temp:  [96.6 F (35.9 C)-98.7 F (37.1 C)] 97.6 F (36.4 C) (07/31 0526) Pulse Rate:  [51-76] 63 (07/31 0526) Resp:  [10-20] 20 (07/31 0526) BP: (108-159)/(55-98) 139/72 (07/31 0526) SpO2:  [93 %-100 %] 98 % (07/31 0526) Weight:  [105.7 kg] 105.7 kg (07/30 1345)  Intake/Output from previous day: 07/30 0701 - 07/31 0700 In: 2409.5 [P.O.:1020; I.V.:1189.5; IV Piggyback:200] Out: 1751 [Urine:1550; Blood:125] Intake/Output this shift: No intake/output data recorded.  Recent Labs    02/15/20 0321  HGB 12.3*   Recent Labs    02/15/20 0321  HCT 35.9*   Recent Labs    02/15/20 0321  NA 137  K 3.7  CL 104  CO2 23  BUN 16  CREATININE 0.89  GLUCOSE 159*  CALCIUM 8.2*   No results for input(s): LABPT, INR in the last 72 hours.  Sensation intact distally Intact pulses distally Incision: dressing C/D/I No cellulitis present Compartment soft   Assessment/Plan: 1 Day Post-Op Procedure(s) (LRB): REVERSE SHOULDER ARTHROPLASTY (Left) I was able to change Manuel Hunter left shoulder dressing and placed a new Aquacel dressing.  The Steri-Strips are intact.  I explained to him in detail about the Aquacel dressing and about recovery from shoulder surgery such as this.  He has information about this as well.  He will follow up with Dr. Veverly Fells during the regular postoperative period in 2 weeks.  All questions and concerns were answered and addressed.    Patient's anticipated LOS is less than 2 midnights, meeting these requirements: - Younger than 2 - Lives within 1 hour of care - Has a competent adult at home to recover with  post-op recover - NO history of  - Chronic pain requiring opiods  - Diabetes  - Coronary Artery Disease  - Heart failure  - Heart attack  - Stroke  - DVT/VTE  - Cardiac arrhythmia  - Respiratory Failure/COPD  - Renal failure  - Anemia  - Advanced Liver disease       Manuel Hunter 02/15/2020, 7:33 AM

## 2020-02-15 NOTE — Discharge Summary (Signed)
Patient ID: Manuel Hunter MRN: [id number] DOB/AGE: 1940-11-30 79 y.o.  Admit date: 02/14/2020 Discharge date: 02/15/2020  Admission Diagnoses:  Active Problems:   H/O total shoulder replacement, left   Discharge Diagnoses:  Same  Past Medical History:  Diagnosis Date  . Arthritis   . Complication of anesthesia    States "I woke up to soon one time" with rotator cuff repair  . GERD (gastroesophageal reflux disease)   . Headache   . History of hiatal hernia   . Lower extremity edema    occasionally takes furosemide prn  . Prostate cancer ([place]) 2007   tx with surgery  . RLS (restless legs syndrome)   . Rotator cuff tear    right  . Sleep apnea    uses CPAP nightly    Surgeries: Procedure(s): REVERSE SHOULDER ARTHROPLASTY on 02/14/2020   Consultants:   Discharged Condition: Improved  Hospital Course: Manuel Hunter is an 79 y.o. male who was admitted 02/14/2020 for operative treatment of<principal problem not specified>. Patient has severe unremitting pain that affects sleep, daily activities, and work/hobbies. After pre-op clearance the patient was taken to the operating room on 02/14/2020 and underwent  Procedure(s): Stem.    Patient was given perioperative antibiotics:  Anti-infectives (From admission, onward)   Start     Dose/Rate Route Frequency Ordered Stop   02/14/20 1700  ceFAZolin (ANCEF) IVPB 2g/100 mL premix        2 g 200 mL/hr over 30 Minutes Intravenous Every 6 hours 02/14/20 1348 02/15/20 0637   02/14/20 1347  amoxicillin (AMOXIL) tablet 2,000 mg  Status:  Discontinued       Note to Pharmacy: Take 2000 mg by mouth before dental procedures     2,000 mg Oral See admin instructions 02/14/20 1348 02/14/20 1418   02/14/20 0745  ceFAZolin (ANCEF) IVPB 2g/100 mL premix        2 g 200 mL/hr over 30 Minutes Intravenous On call to O.R. 02/14/20 0740 02/14/20 1044       Patient was given sequential compression devices, early  ambulation, and chemoprophylaxis to prevent DVT.  Patient benefited maximally from hospital stay and there were no complications.    Recent vital signs:  Patient Vitals for the past 24 hrs:  BP Temp Temp src Pulse Resp SpO2 Height Weight  02/15/20 0526 (!) 139/72 97.6 F (36.4 C) Oral 63 20 98 % -- --  02/15/20 0124 (!) 108/56 98 F (36.7 C) Oral 71 18 96 % -- --  02/14/20 2134 (!) 137/65 98.4 F (36.9 C) Oral 63 16 98 % -- --  02/14/20 1657 (!) 140/69 97.8 F (36.6 C) -- 64 17 99 % -- --  02/14/20 1606 (!) 129/69 97.6 F (36.4 C) Oral 61 17 97 % -- --  02/14/20 1513 (!) 121/62 98.7 F (37.1 C) -- 68 17 98 % -- --  02/14/20 1345 (!) 136/58 98.1 F (36.7 C) Oral 66 15 95 % 5\' 6"  (1.676 m) (!) 105.7 kg  02/14/20 1330 (!) 149/84 97.7 F (36.5 C) -- 71 20 98 % -- --  02/14/20 1315 (!) 159/80 -- -- 69 12 96 % -- --  02/14/20 1300 (!) 137/63 -- -- 76 17 93 % -- --  02/14/20 1245 (!) 131/59 -- -- 73 18 96 % -- --  02/14/20 1230 (!) 129/62 97.8 F (36.6 C) -- 72 18 97 % -- --  02/14/20 1215 (!) 137/55 -- -- 73 19 99 % -- --  02/14/20 1200 (!) 158/98 (!) 96.6 F (35.9 C) -- 73 18 99 % -- --  02/14/20 0924 -- -- -- 53 17 97 % -- --  02/14/20 0919 -- -- -- 51 (!) 10 95 % -- --  02/14/20 0914 (!) 157/65 -- -- 58 18 95 % -- --  02/14/20 0804 (!) 144/85 98.6 F (37 C) Oral 60 17 100 % 5\' 6"  (1.676 m) (!) 105.7 kg     Recent laboratory studies:  Recent Labs    02/15/20 0321  HGB 12.3*  HCT 35.9*  NA 137  K 3.7  CL 104  CO2 23  BUN 16  CREATININE 0.89  GLUCOSE 159*  CALCIUM 8.2*     Discharge Medications:   Allergies as of 02/15/2020      Reactions   Morphine Nausea And Vomiting      Medication List    STOP taking these medications   naproxen 500 MG tablet Commonly known as: Naprosyn     TAKE these medications   amoxicillin 500 MG tablet Commonly known as: AMOXIL Take 2,000 mg by mouth See admin instructions. Take 2000 mg by mouth before dental procedures    atorvastatin 10 MG tablet Commonly known as: LIPITOR Take 10 mg by mouth daily.   furosemide 20 MG tablet Commonly known as: LASIX Take 20 mg by mouth daily as needed for edema.   gabapentin 100 MG capsule Commonly known as: NEURONTIN Take 100 mg by mouth 3 (three) times daily.   metFORMIN 500 MG 24 hr tablet Commonly known as: GLUCOPHAGE-XR Take 500 mg by mouth daily.   methocarbamol 500 MG tablet Commonly known as: ROBAXIN Take 1 tablet (500 mg total) by mouth every 6 (six) hours as needed for muscle spasms.   ondansetron 4 MG tablet Commonly known as: Zofran Take 1 tablet (4 mg total) by mouth every 8 (eight) hours as needed for nausea or vomiting.   oxyCODONE 5 MG immediate release tablet Commonly known as: Roxicodone Take 1 tablet (5 mg total) by mouth every 6 (six) hours as needed for severe pain. What changed: Another medication with the same name was added. Make sure you understand how and when to take each.   oxyCODONE 5 MG immediate release tablet Commonly known as: Roxicodone Take 1-2 tablets (5-10 mg total) by mouth every 6 (six) hours as needed for severe pain. What changed: You were already taking a medication with the same name, and this prescription was added. Make sure you understand how and when to take each.   pantoprazole 40 MG tablet Commonly known as: PROTONIX Take 40 mg by mouth every morning.   phenytoin 100 MG ER capsule Commonly known as: DILANTIN Take 100-200 mg by mouth See admin instructions. Take 100 mg by mouth in the morning and 200 mg at night   rOPINIRole 1 MG tablet Commonly known as: REQUIP Take 1 mg by mouth 3 (three) times daily with meals.   rOPINIRole 3 MG tablet Commonly known as: REQUIP Take 3 mg by mouth at bedtime.   zolpidem 5 MG tablet Commonly known as: AMBIEN Take 5 mg by mouth at bedtime as needed for sleep. What changed: Another medication with the same name was removed. Continue taking this medication, and  follow the directions you see here.       Diagnostic Studies: DG Shoulder Left Port  Result Date: 02/14/2020 CLINICAL DATA:  Post total shoulder arthroplasty EXAM: LEFT SHOULDER COMPARISON:  Portable exam 1230 hours without priors for  comparison FINDINGS: Reverse LEFT shoulder prosthesis identified in expected position. Mild osseous demineralization. Degenerative changes AC joint. No fracture, dislocation or bone destruction identified on single AP view. IMPRESSION: Reverse LEFT shoulder arthroplasty without acute abnormalities. Electronically Signed   By: Lavonia Dana M.D.   On: 02/14/2020 12:54    Disposition: Discharge disposition: 01-Home or Self Care          Follow-up Information    Netta Cedars, MD. Call in 2 weeks.   Specialty: Orthopedic Surgery Why: [phone] Contact information: [address] [place] [address] [phone]                Signed: [name] 02/15/2020, 7:37 AM

## 2020-02-15 NOTE — Evaluation (Signed)
Occupational Therapy Evaluation Patient Details Name: Manuel Hunter MRN: [id number] DOB: 01-27-1941 Today's Date: 02/15/2020    History of Present Illness Patient is a 79 year old man s/p left shoulder replacement.   Clinical Impression   Patient presents s/p shoulder replacement without functional use of left upper extremity secondary to effects of surgery and interscalene block and shoulder precautions. Therapist provided education and instruction to patient in regards to exercises, precautions, positioning, donning upper extremity clothing and bathing while maintaining shoulder precautions, ice and edema management and donning/doffing sling. Patient verbalized understanding and demonstrated as needed. Patient needed assistance to donn shirt and pants and provided with instruction on compensatory strategies to perform ADLs. Patient to follow up with MD for further therapy needs.      Follow Up Recommendations  Follow surgeon's recommendation for DC plan and follow-up therapies    Equipment Recommendations  None recommended by OT    Recommendations for Other Services       Precautions / Restrictions Precautions Precautions: Shoulder Type of Shoulder Precautions: No AROM, No PROM Shoulder Interventions: At all times;Off for dressing/bathing/exercises;Shoulder sling/immobilizer Precaution Booklet Issued: No Restrictions Weight Bearing Restrictions: Yes LUE Weight Bearing: Non weight bearing      Mobility Bed Mobility               General bed mobility comments: Patient seated on side of bed when therapist entered room.  Transfers                 General transfer comment: Min guard to transfer to side of the bed.    Balance Overall balance assessment: No apparent balance deficits (not formally assessed)                                         ADL either performed or assessed with clinical judgement   ADL Overall ADL's : Needs  assistance/impaired Eating/Feeding: Independent   Grooming: Independent   Upper Body Bathing: Minimal assistance   Lower Body Bathing: Minimal assistance;Sit to/from stand   Upper Body Dressing : Maximal assistance;Sitting   Lower Body Dressing: Minimal assistance;Sit to/from stand   Toilet Transfer: Min guard   Toileting- Water quality scientist and Hygiene: Minimal assistance;Sit to/from stand       Functional mobility during ADLs: Min guard       Vision   Vision Assessment?: No apparent visual deficits     Perception     Praxis      Pertinent Vitals/Pain Pain Assessment: No/denies pain     Hand Dominance     Extremity/Trunk Assessment Upper Extremity Assessment Upper Extremity Assessment: LUE deficits/detail LUE Deficits / Details: Decreased ROM and strength and active movement secondary to effects of interscalene block and shoulder precautions. Patient able to grossly perform supination, wrist movemetn and finger movement.           Communication     Cognition Arousal/Alertness: Awake/alert Behavior During Therapy: WFL for tasks assessed/performed Overall Cognitive Status: Within Functional Limits for tasks assessed                                     General Comments       Exercises     Shoulder Instructions Shoulder Instructions Donning/doffing shirt without moving shoulder: Patient able to independently direct caregiver;Maximal assistance Method for sponge  bathing under operated UE: Patient able to independently direct caregiver;Minimal assistance Donning/doffing sling/immobilizer: Moderate assistance;Patient able to independently direct caregiver Correct positioning of sling/immobilizer: Patient able to independently direct caregiver ROM for elbow, wrist and digits of operated UE: Patient able to independently direct caregiver Sling wearing schedule (on at all times/off for ADL's): Patient able to independently direct  caregiver Proper positioning of operated UE when showering: Patient able to independently direct caregiver Dressing change: Patient able to independently direct caregiver Positioning of UE while sleeping: Patient able to independently direct caregiver    Home Living Family/patient expects to be discharged to:: Private residence Living Arrangements: Spouse/significant other Available Help at Discharge: Family;Available 24 hours/day                                    Prior Functioning/Environment                   OT Problem List: Decreased strength;Impaired UE functional use      OT Treatment/Interventions:      OT Goals(Current goals can be found in the care plan section) Acute Rehab OT Goals Patient Stated Goal: to go home soon OT Goal Formulation: All assessment and education complete, DC therapy  OT Frequency:     Barriers to D/C:            Co-evaluation              AM-PAC OT "6 Clicks" Daily Activity     Outcome Measure Help from another person eating meals?: None Help from another person taking care of personal grooming?: None Help from another person toileting, which includes using toliet, bedpan, or urinal?: A Little Help from another person bathing (including washing, rinsing, drying)?: A Little Help from another person to put on and taking off regular upper body clothing?: A Lot Help from another person to put on and taking off regular lower body clothing?: A Little 6 Click Score: 19   End of Session Nurse Communication:  (OT education complete.)  Activity Tolerance: Patient tolerated treatment well Patient left: in chair;with call bell/phone within reach  OT Visit Diagnosis: Muscle weakness (generalized) (M62.81)                Time: 2035-5974 OT Time Calculation (min): 21 min Charges:  OT General Charges $OT Visit: 1 Visit OT Evaluation $OT Eval Low Complexity: 1 Low  Manuel Hunter, OTR/L [place]  Office  [phone] Pager: [phone]   Manuel Hunter 02/15/2020, 3:58 PM

## 2020-02-17 ENCOUNTER — Encounter (HOSPITAL_COMMUNITY): Payer: Self-pay | Admitting: Orthopedic Surgery

## 2021-04-29 ENCOUNTER — Ambulatory Visit: Payer: Self-pay

## 2021-04-29 ENCOUNTER — Telehealth: Payer: Self-pay | Admitting: Orthopaedic Surgery

## 2021-04-29 ENCOUNTER — Encounter: Payer: Self-pay | Admitting: Orthopaedic Surgery

## 2021-04-29 ENCOUNTER — Ambulatory Visit (INDEPENDENT_AMBULATORY_CARE_PROVIDER_SITE_OTHER): Payer: Medicare HMO | Admitting: Orthopaedic Surgery

## 2021-04-29 DIAGNOSIS — Z96642 Presence of left artificial hip joint: Secondary | ICD-10-CM

## 2021-04-29 DIAGNOSIS — M79605 Pain in left leg: Secondary | ICD-10-CM | POA: Diagnosis not present

## 2021-04-29 MED ORDER — ACETAMINOPHEN-CODEINE #3 300-30 MG PO TABS: 1.0000 | ORAL_TABLET | Freq: Three times a day (TID) | ORAL | 0 refills | Status: DC | PRN

## 2021-04-29 MED ORDER — BACLOFEN 10 MG PO TABS: 10.0000 mg | ORAL_TABLET | Freq: Three times a day (TID) | ORAL | 1 refills | Status: DC | PRN

## 2021-04-29 MED ORDER — GABAPENTIN 100 MG PO CAPS: 100.0000 mg | ORAL_CAPSULE | Freq: Two times a day (BID) | ORAL | 1 refills | Status: DC

## 2021-04-29 NOTE — Telephone Encounter (Signed)
Pt would like to know if he is suppose to take 300 mg gabapentin? He states is is suppose to be going up

## 2021-04-29 NOTE — Progress Notes (Signed)
Manuel Hunter comes in today with debilitating left-sided low back pain and sciatica that has been getting worse for over a month now.  He has a remote history of multiple injections in his lumbar spine by Dr. Nelva Bush with [facility].  Is been a while since he has had that done.  He is a diabetic.  He is now on Eliquis due to A. fib.  He points to the left sciatic area and pelvis as a source of his pain and is very uncomfortable throughout the day and at night and is getting worse.  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  He reports significant stiffness in his left lower extremity at this point.  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  He does have a positive straight leg raise on the left side.  There is a lot of pain with sciatic stretch on the left side.  He has SI joint pain and low back pain all to the left side.  There is some weakness with foot dorsiflexion on the left side and not on the right.  An AP pelvis and lateral left hip shows a well-seated total hip arthroplasty no complicating features.  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  At this point it is essential we obtain a MRI of the lumbar spine to assess the degree of stenosis and determine the source of his sciatica to hopefully be able to recommend an intervention for the spine.  In the interim I am going to have him on gabapentin combined with Tylenol 3 and some baclofen.  Once we have the MRI results I will give him a call and develop a treatment plan.  All question concerns were answered and addressed.

## 2021-04-30 ENCOUNTER — Other Ambulatory Visit: Payer: Self-pay

## 2021-04-30 DIAGNOSIS — M4807 Spinal stenosis, lumbosacral region: Secondary | ICD-10-CM

## 2021-04-30 NOTE — Telephone Encounter (Signed)
Lvm informing pt.

## 2021-05-16 ENCOUNTER — Ambulatory Visit
Admission: RE | Admit: 2021-05-16 | Discharge: 2021-05-16 | Disposition: A | Discharge status: Home / Self Care | Payer: Medicare HMO | Source: Ambulatory Visit | Attending: Orthopaedic Surgery | Admitting: Orthopaedic Surgery

## 2021-05-16 DIAGNOSIS — M4807 Spinal stenosis, lumbosacral region: Secondary | ICD-10-CM

## 2021-05-16 NOTE — Progress Notes (Signed)
Attempted MRI Lumbar spine. 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 Per neuro radiologist Dr. Pascal Lux not ok to scan at this time. Pt is advised to call the practice that implanted this device to get information prior to rescheduling.

## 2021-05-20 ENCOUNTER — Telehealth: Payer: Self-pay | Admitting: Orthopaedic Surgery

## 2021-05-20 NOTE — Telephone Encounter (Signed)
Pt called stating his MRI is 11/6 and his appt is 11/7 and he dont want to cancel. Want to make sure MRI results will be back before cancelling 11/7/. Pt states is back is worse. Please call pt at [phone].

## 2021-05-23 ENCOUNTER — Other Ambulatory Visit: Payer: Self-pay

## 2021-05-23 ENCOUNTER — Ambulatory Visit
Admission: RE | Admit: 2021-05-23 | Discharge: 2021-05-23 | Disposition: A | Discharge status: Home / Self Care | Payer: Medicare HMO | Source: Ambulatory Visit | Attending: Orthopaedic Surgery | Admitting: Orthopaedic Surgery

## 2021-05-23 DIAGNOSIS — M4807 Spinal stenosis, lumbosacral region: Secondary | ICD-10-CM

## 2021-05-23 IMAGING — MR MR LUMBAR SPINE W/O CM
4 of 5 series · 24 of 48 positions shown · non-contrast
Comparison: Lumbar spine radiograph [DATE]

CLINICAL DATA: Spinal stenosis, lumbosacral r/o HNP, left sciatica

EXAM:
MRI LUMBAR SPINE WITHOUT CONTRAST
TECHNIQUE: Multiplanar, multisequence MR imaging of the lumbar spine was
performed. No intravenous contrast was administered.

[Series 2: T2 · sagittal · 4.0mm · 0.53mm/px · 6 of 15 slices shown (1 of 2)]
[im 1/15]
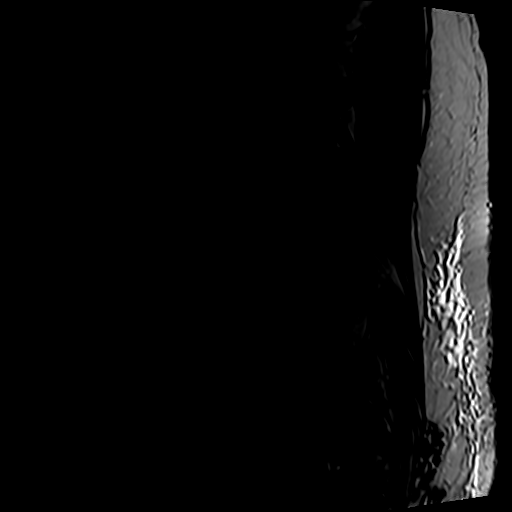
[im 3/15]
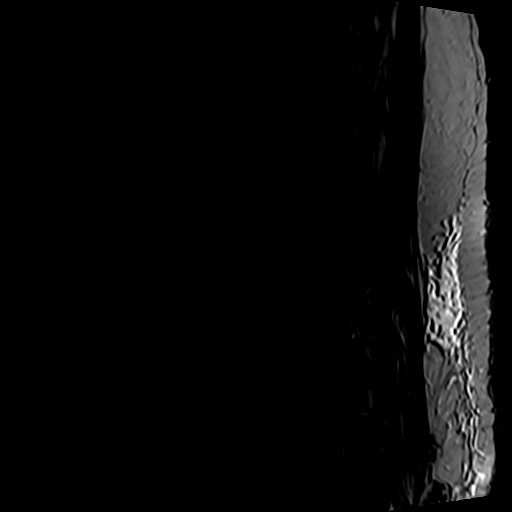
[im 6/15]
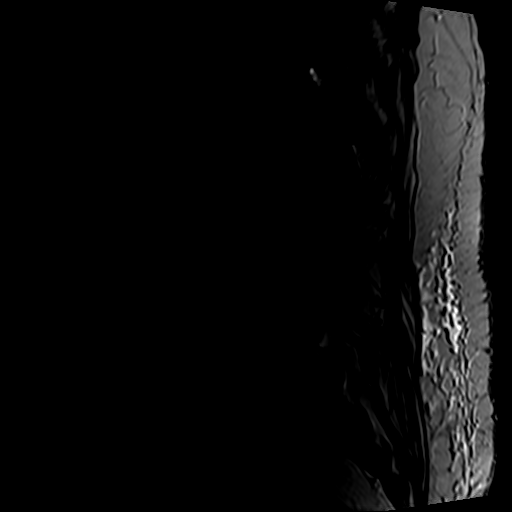
[im 9/15]
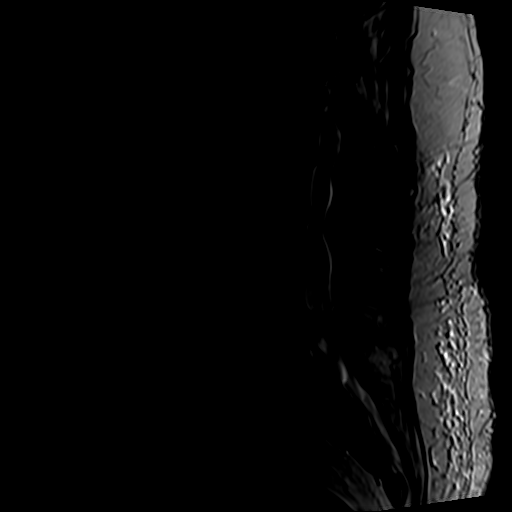
[im 12/15]
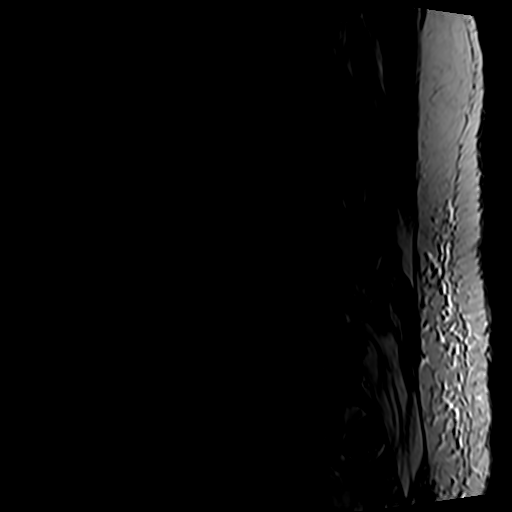
[im 15/15]
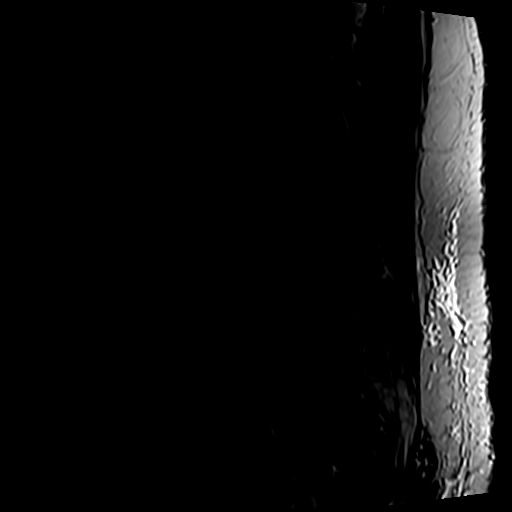

[Series 4: T1 · sagittal · 4.0mm · 0.53mm/px · 6 of 15 slices shown (1 of 2)]
[im 1/15]
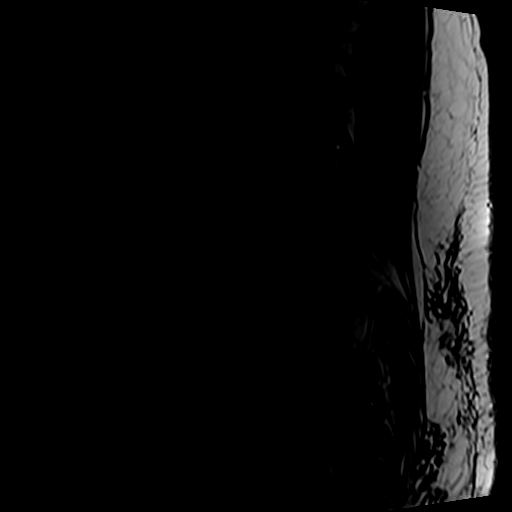
[im 3/15]
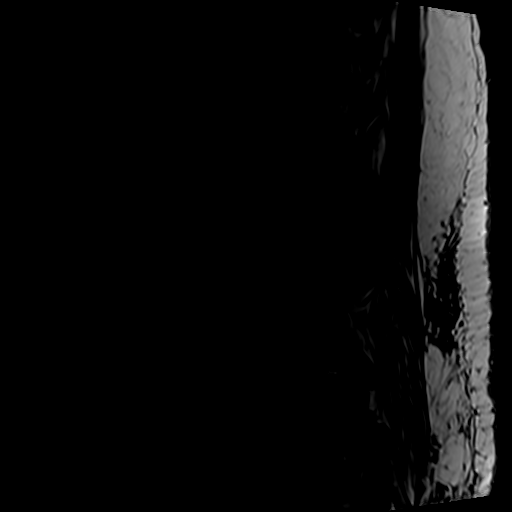
[im 6/15]
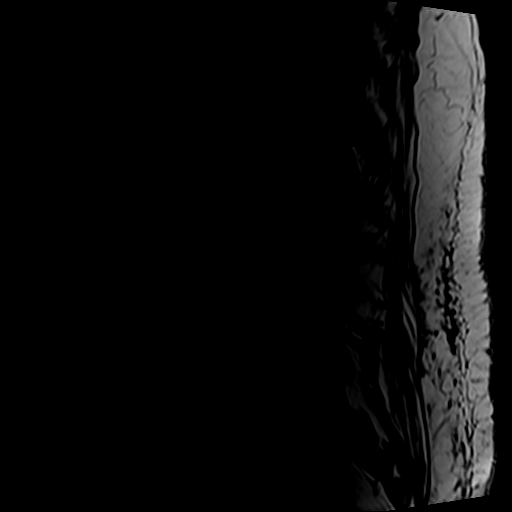
[im 9/15]
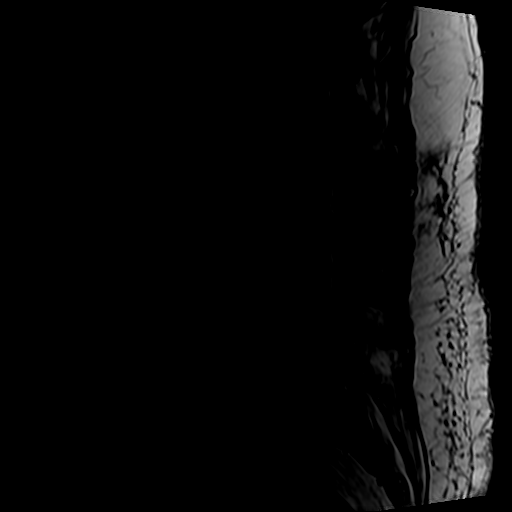
[im 12/15]
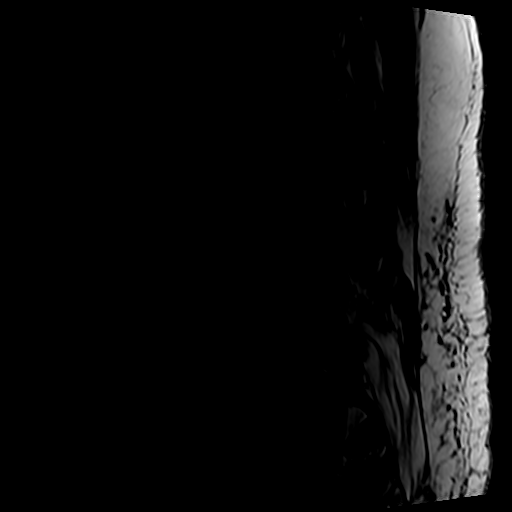
[im 15/15]
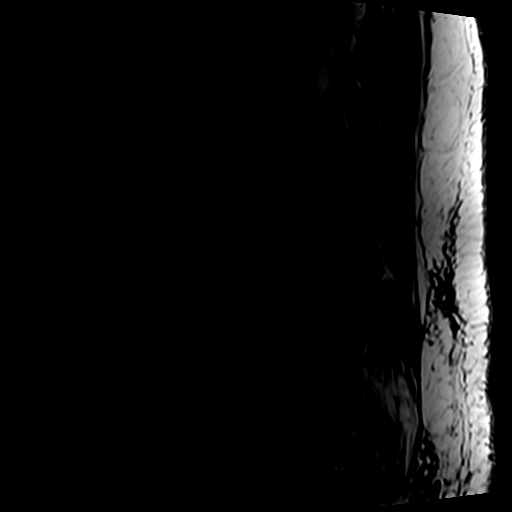

[Series 5: T2 · axial · 4.0mm · 0.70mm/px · z∈[-104,+124]mm · 9 of 37 slices shown (2 of 2)]
[im 1/37]
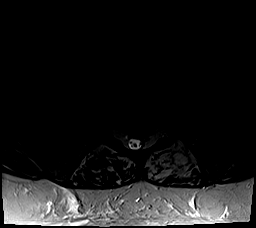
[im 6/37]
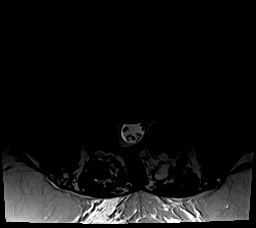
[im 11/37]
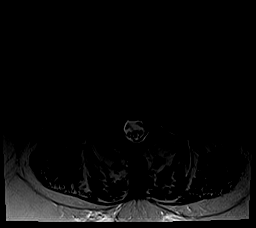
[im 16/37]
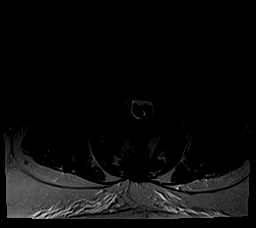
[im 19/37]
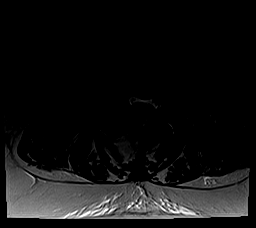
[im 21/37]
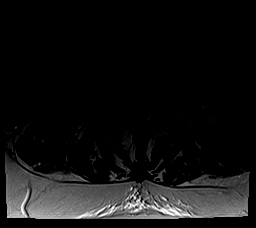
[im 26/37]
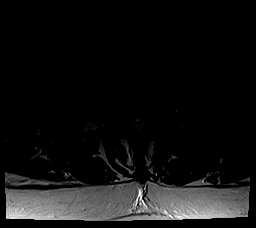
[im 31/37]
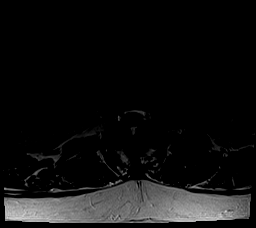
[im 37/37]
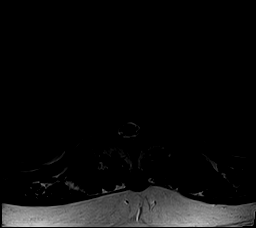

[Series 6: T1 · axial · 4.0mm · 0.35mm/px · z∈[-79,+77]mm · 3 of 37 slices shown (2 of 2)]
[im 6/37]
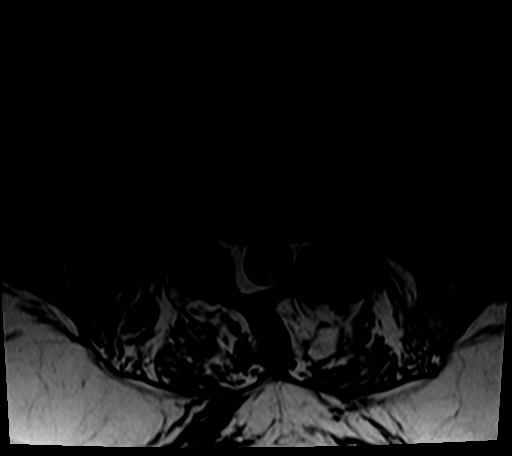
[im 19/37]
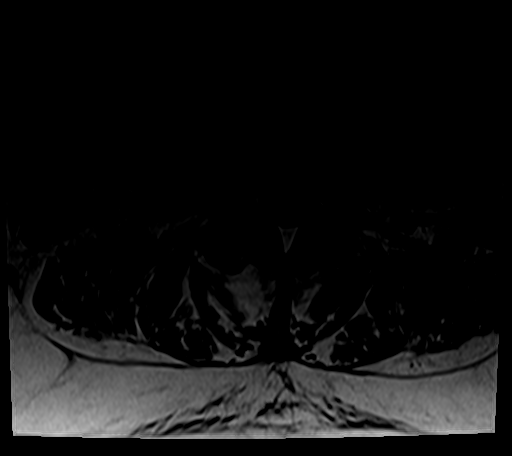
[im 31/37]
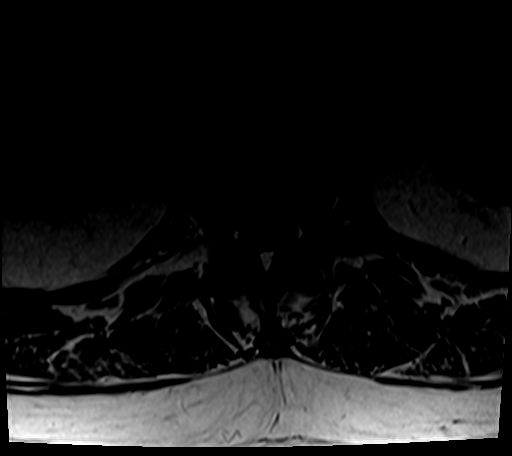

[24 of 48 positions shown; findings below may reference images not displayed]

FINDINGS: Segmentation:  Standard.

Alignment:  Trace anterolisthesis at L5-S1.

Vertebrae: No fracture, evidence of discitis, or aggressive bone
lesion.

Conus medullaris and cauda equina: Conus extends to the L1 level.
Conus and cauda equina appear normal. There is fatty thickening of
the filum terminalis measuring 3 mm, which in this case is likely to
be a clinically insignificant finding.

Paraspinal and other soft tissues: Right posterior renal cyst. Mild
paraspinal muscle atrophy.

Disc levels: Prominent posterior epidural fat throughout
contributing to spinal canal stenosis.

T11-T12: Broad-based disc bulging and bilateral facet arthropathy
results in mild spinal canal narrowing and bilateral neural
foraminal narrowing.

T12-L1: Broad-based disc bulging, ligamentum flavum hypertrophy and
bilateral facet arthropathy results in mild spinal canal and left
neural foraminal narrowing.

L1-L2: Broad-based disc bulging, ligamentum flavum hypertrophy,
bilateral facet arthropathy result in mild spinal canal stenosis,
moderate right and mild left neural foraminal narrowing.

L2-L3: Broad-based disc bulging, ligamentum flavum hypertrophy, and
bilateral facet arthropathy result in moderate spinal canal
stenosis. Severe right and mild left neural foraminal narrowing.

L3-L4: Broad-based disc bulging, ligamentum flavum hypertrophy, and
bilateral facet arthropathy. Severe right and moderate left neural
foraminal narrowing. No significant spinal canal narrowing.

L4-L5: Broad-based disc bulging, ligamentum flavum hypertrophy and
bilateral facet arthropathy. Severe left and moderate right neural
foraminal narrowing. Mild deformation of the left aspect of the
thecal sac without significant spinal canal stenosis.

L5-S1: Trace anterolisthesis. Broad-based disc bulging, ligamentum
flavum hypertrophy and bilateral facet arthropathy. Severe left and
mild right neural foraminal narrowing. No significant spinal canal
stenosis.
IMPRESSION: Multilevel degenerative changes throughout the lumbar spine, as
summarized below:

T11-T12: Mild spinal canal stenosis. Mild bilateral neural foraminal
narrowing.

T12-L1: Mild spinal canal stenosis. Mild left neural foraminal
narrowing.

L1-L2: Mild spinal canal stenosis. Moderate right and mild left
neural foraminal narrowing.

L2-L3: Moderate spinal canal stenosis. Severe right and mild left
neural foraminal narrowing.

L3-L4: Severe right and moderate left neural foraminal narrowing. No
spinal canal stenosis.

L4-L5: Severe left and moderate right neural foraminal narrowing. No
spinal canal stenosis.

L5-S1: Severe left and mild right neural foraminal narrowing. No
spinal canal stenosis. Trace anterolisthesis at this level.

Note there is a mild degree of epidural lipomatosis contributing to
the levels of spinal canal stenosis.

## 2021-05-24 ENCOUNTER — Encounter: Payer: Self-pay | Admitting: Orthopaedic Surgery

## 2021-05-24 ENCOUNTER — Other Ambulatory Visit: Payer: Self-pay

## 2021-05-24 ENCOUNTER — Ambulatory Visit: Payer: Medicare HMO | Admitting: Orthopaedic Surgery

## 2021-05-24 DIAGNOSIS — M4807 Spinal stenosis, lumbosacral region: Secondary | ICD-10-CM

## 2021-05-24 MED ORDER — HYDROCODONE-ACETAMINOPHEN 7.5-325 MG PO TABS: 1.0000 | ORAL_TABLET | Freq: Four times a day (QID) | ORAL | 0 refills | Status: DC | PRN

## 2021-05-24 NOTE — Progress Notes (Signed)
Mr. Manuel Hunter comes in today to go over the MRI of his lumbar spine.  He is having severe left-sided sciatica and a stabbing pain on that left side.  I have replaced his left hip.  His left hip exam is entirely normal.  This hurts a lot with weightbearing.  He has had bilateral lower extremity pitting edema as well.  He is 80 years old.  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  He does report a history of epidural steroid injections by Dr. Herma Mering with [facility] and he has been comfortable with very most in the past doing these injections.  It has been a few years.   At this point I am recommending repeat ESI's to the left-sided L4-L5 and L5-S1 by Dr. Herma Mering.  We will see what we can do to expedite getting that ordered.  I will send in some Norco for pain.  All questions and concerns were answered and addressed.

## 2021-09-08 ENCOUNTER — Other Ambulatory Visit: Payer: Self-pay | Admitting: Orthopaedic Surgery

## 2021-09-17 ENCOUNTER — Other Ambulatory Visit: Payer: Self-pay | Admitting: Student

## 2021-09-17 DIAGNOSIS — M5416 Radiculopathy, lumbar region: Secondary | ICD-10-CM

## 2021-10-08 ENCOUNTER — Encounter (HOSPITAL_BASED_OUTPATIENT_CLINIC_OR_DEPARTMENT_OTHER): Payer: Self-pay

## 2021-10-08 ENCOUNTER — Emergency Department (HOSPITAL_BASED_OUTPATIENT_CLINIC_OR_DEPARTMENT_OTHER)
Admission: EM | Admit: 2021-10-08 | Discharge: 2021-10-08 | Disposition: A | Discharge status: Home / Self Care | Payer: Medicare HMO | Attending: Emergency Medicine | Admitting: Emergency Medicine

## 2021-10-08 ENCOUNTER — Other Ambulatory Visit: Payer: Self-pay

## 2021-10-08 ENCOUNTER — Emergency Department (HOSPITAL_BASED_OUTPATIENT_CLINIC_OR_DEPARTMENT_OTHER): Payer: Medicare HMO

## 2021-10-08 DIAGNOSIS — W010XXA Fall on same level from slipping, tripping and stumbling without subsequent striking against object, initial encounter: Secondary | ICD-10-CM | POA: Insufficient documentation

## 2021-10-08 DIAGNOSIS — Y939 Activity, unspecified: Secondary | ICD-10-CM | POA: Diagnosis not present

## 2021-10-08 DIAGNOSIS — Z96652 Presence of left artificial knee joint: Secondary | ICD-10-CM | POA: Diagnosis not present

## 2021-10-08 DIAGNOSIS — Z8546 Personal history of malignant neoplasm of prostate: Secondary | ICD-10-CM | POA: Insufficient documentation

## 2021-10-08 DIAGNOSIS — M7042 Prepatellar bursitis, left knee: Secondary | ICD-10-CM | POA: Insufficient documentation

## 2021-10-08 DIAGNOSIS — S46001A Unspecified injury of muscle(s) and tendon(s) of the rotator cuff of right shoulder, initial encounter: Secondary | ICD-10-CM | POA: Insufficient documentation

## 2021-10-08 DIAGNOSIS — Z96642 Presence of left artificial hip joint: Secondary | ICD-10-CM | POA: Insufficient documentation

## 2021-10-08 DIAGNOSIS — Y92511 Restaurant or cafe as the place of occurrence of the external cause: Secondary | ICD-10-CM | POA: Insufficient documentation

## 2021-10-08 IMAGING — DX DG KNEE COMPLETE 4+V*L*
4 series · 4 of 4 positions shown · non-contrast
Comparison: [DATE]

CLINICAL DATA: Injury

EXAM:
LEFT KNEE - COMPLETE 4+ VIEW

[knee ap]
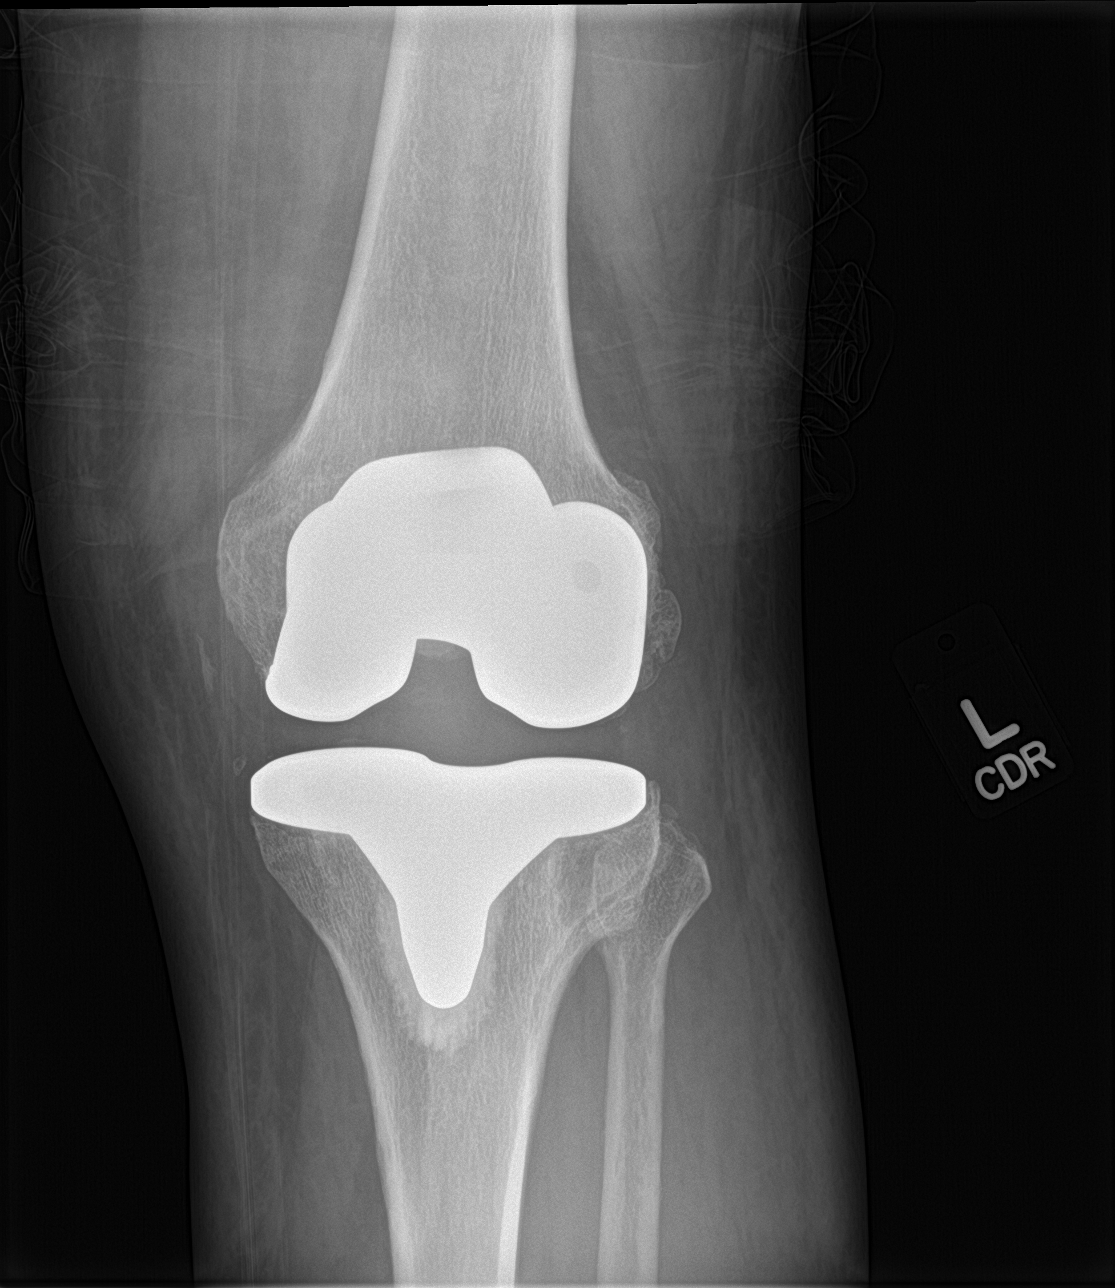

[knee lat]
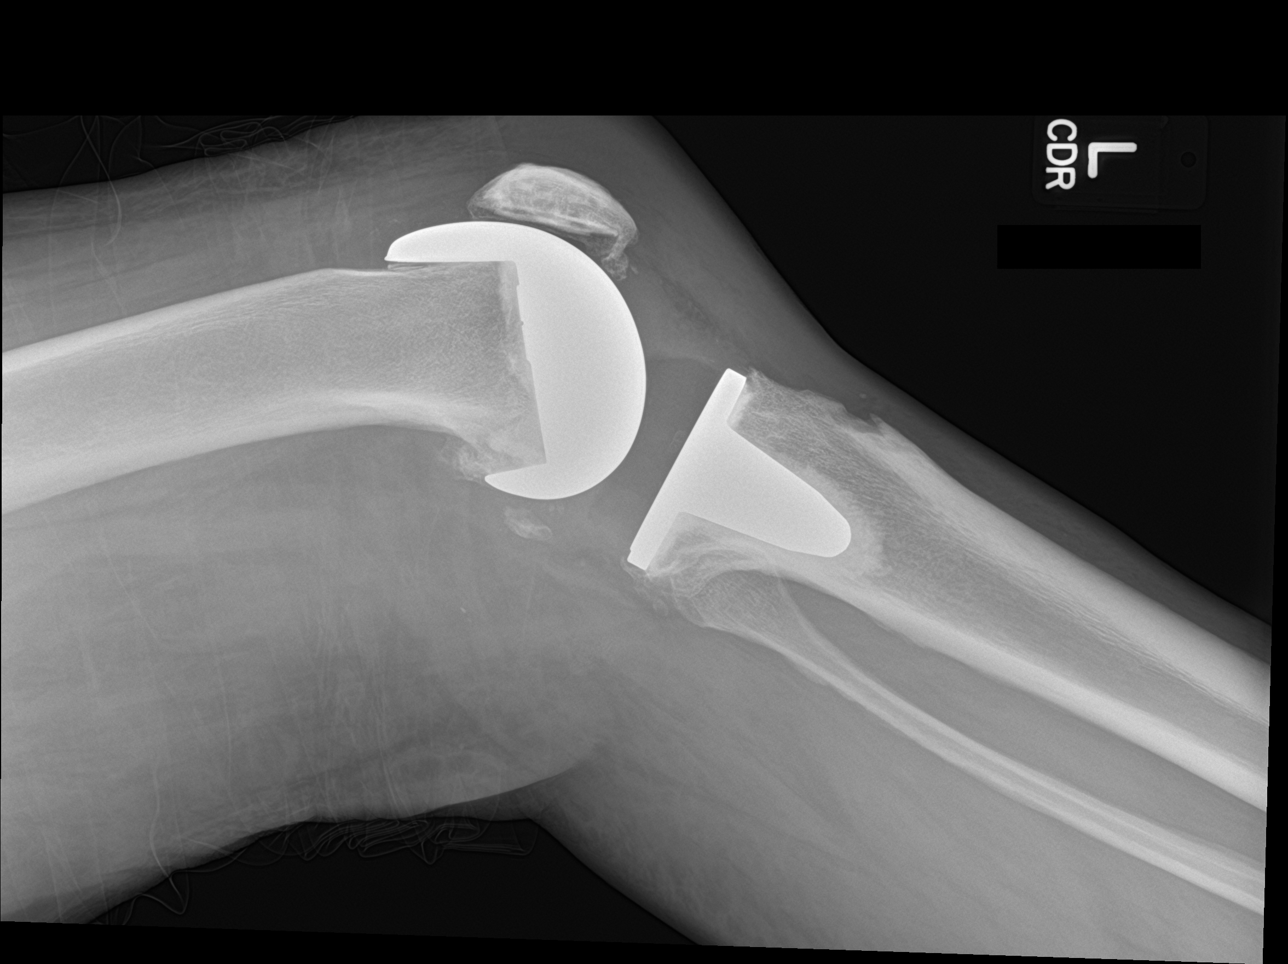

[knee obl (1 of 2)]
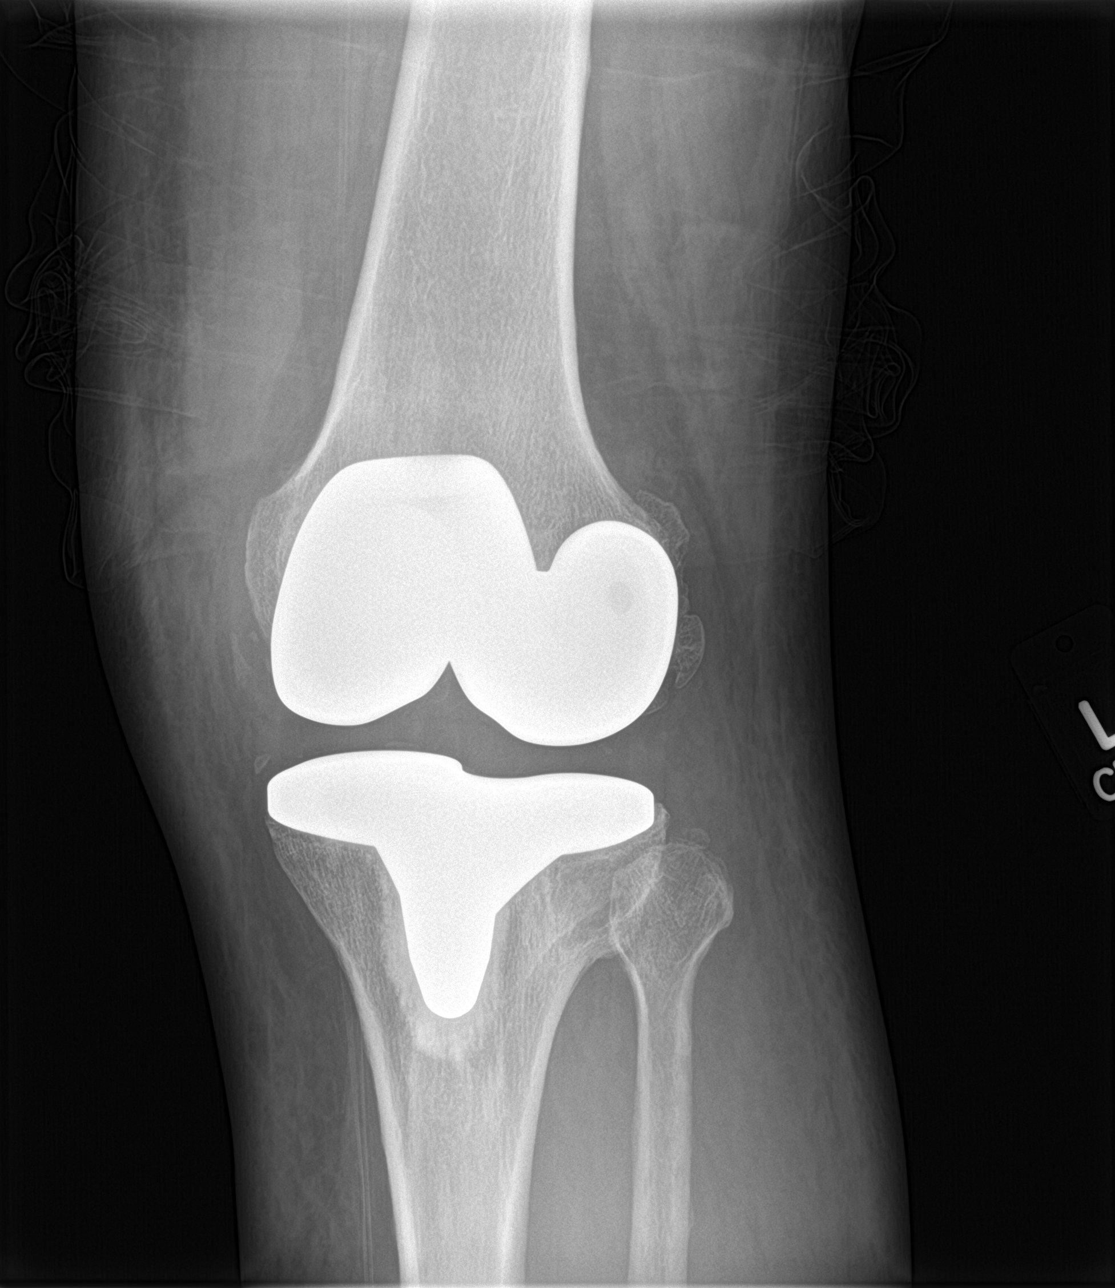

[knee obl (2 of 2)]
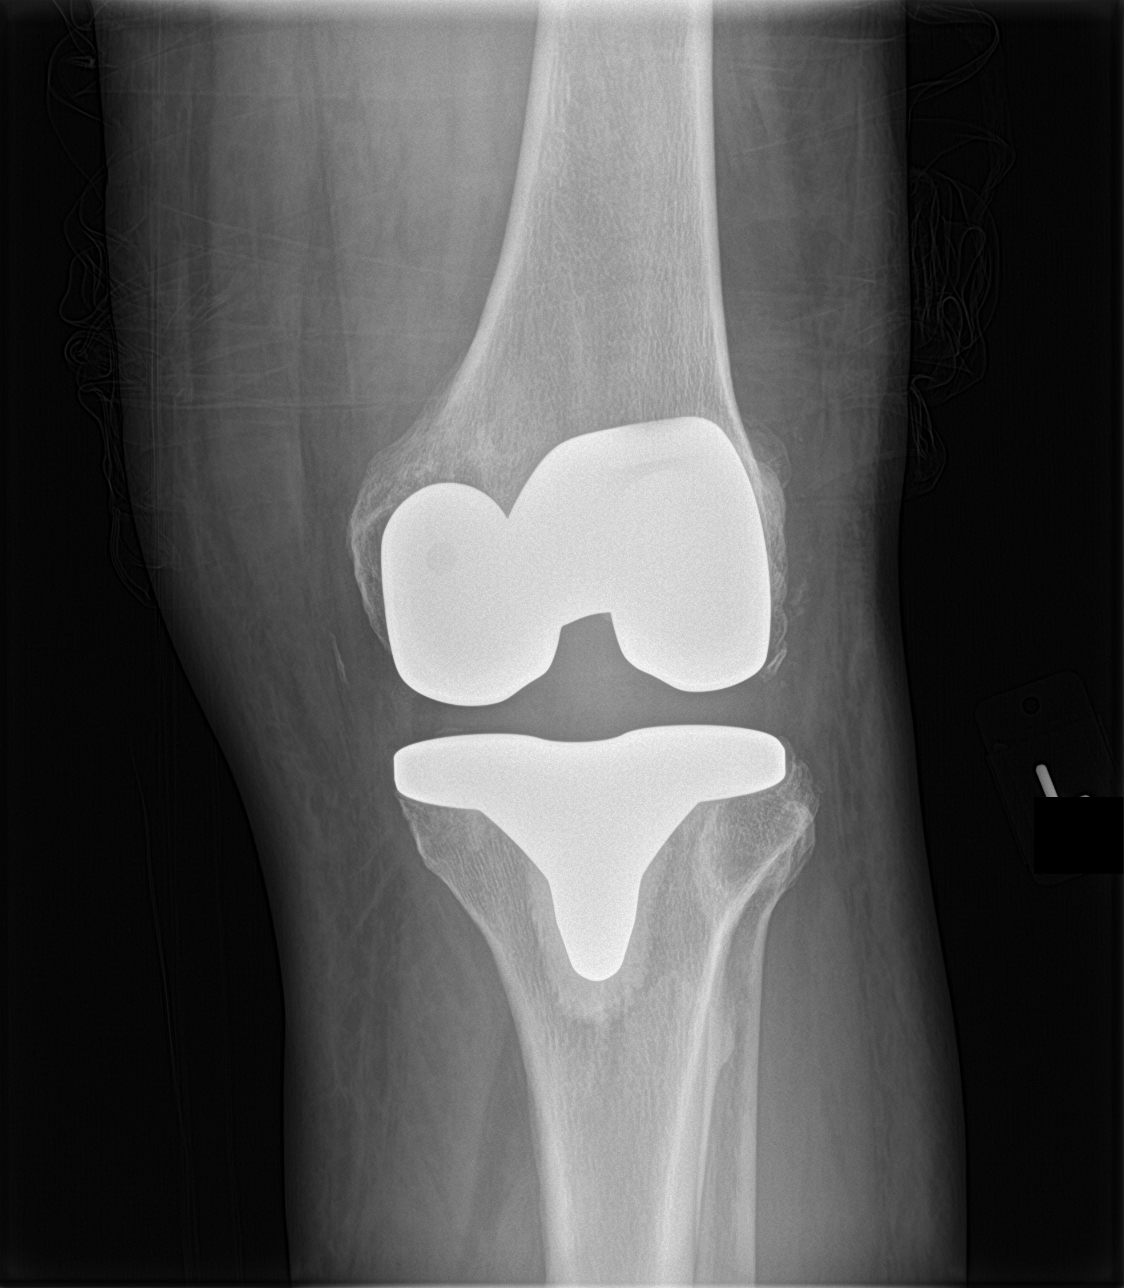

[4 of 4 positions shown; findings below may reference images not displayed]

FINDINGS: Frontal, bilateral oblique, and cross-table lateral views of the
left knee are obtained. Three component left knee arthroplasty is
identified in the expected position without signs of acute
complication. There are no acute or destructive bony lesions. No
joint effusion. Marked prepatellar soft tissue edema.
IMPRESSION: 1. Prepatellar soft tissue swelling.
2. No acute fracture.
3. Unremarkable left knee arthroplasty.

## 2021-10-08 IMAGING — DX DG SHOULDER 2+V*R*
3 series · 3 of 3 positions shown · non-contrast
Comparison: Chest x-ray [DATE]

CLINICAL DATA: Shoulder injury

EXAM:
RIGHT SHOULDER - 2+ VIEW

[shoulder grashey]
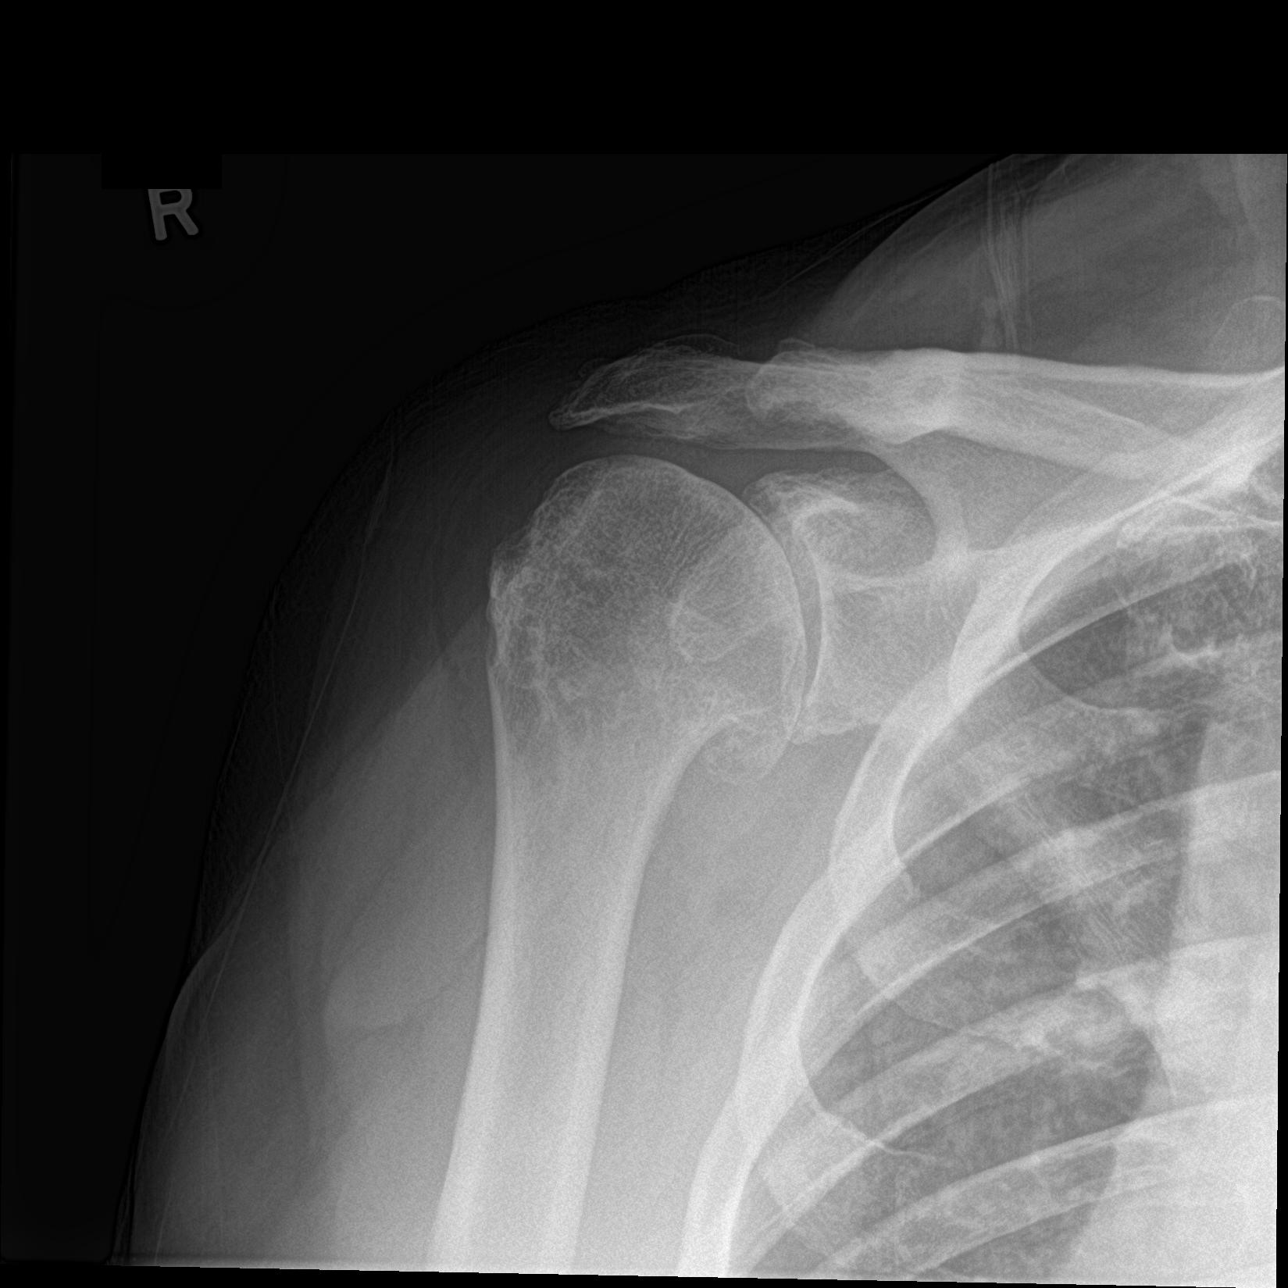

[shoulder y view]
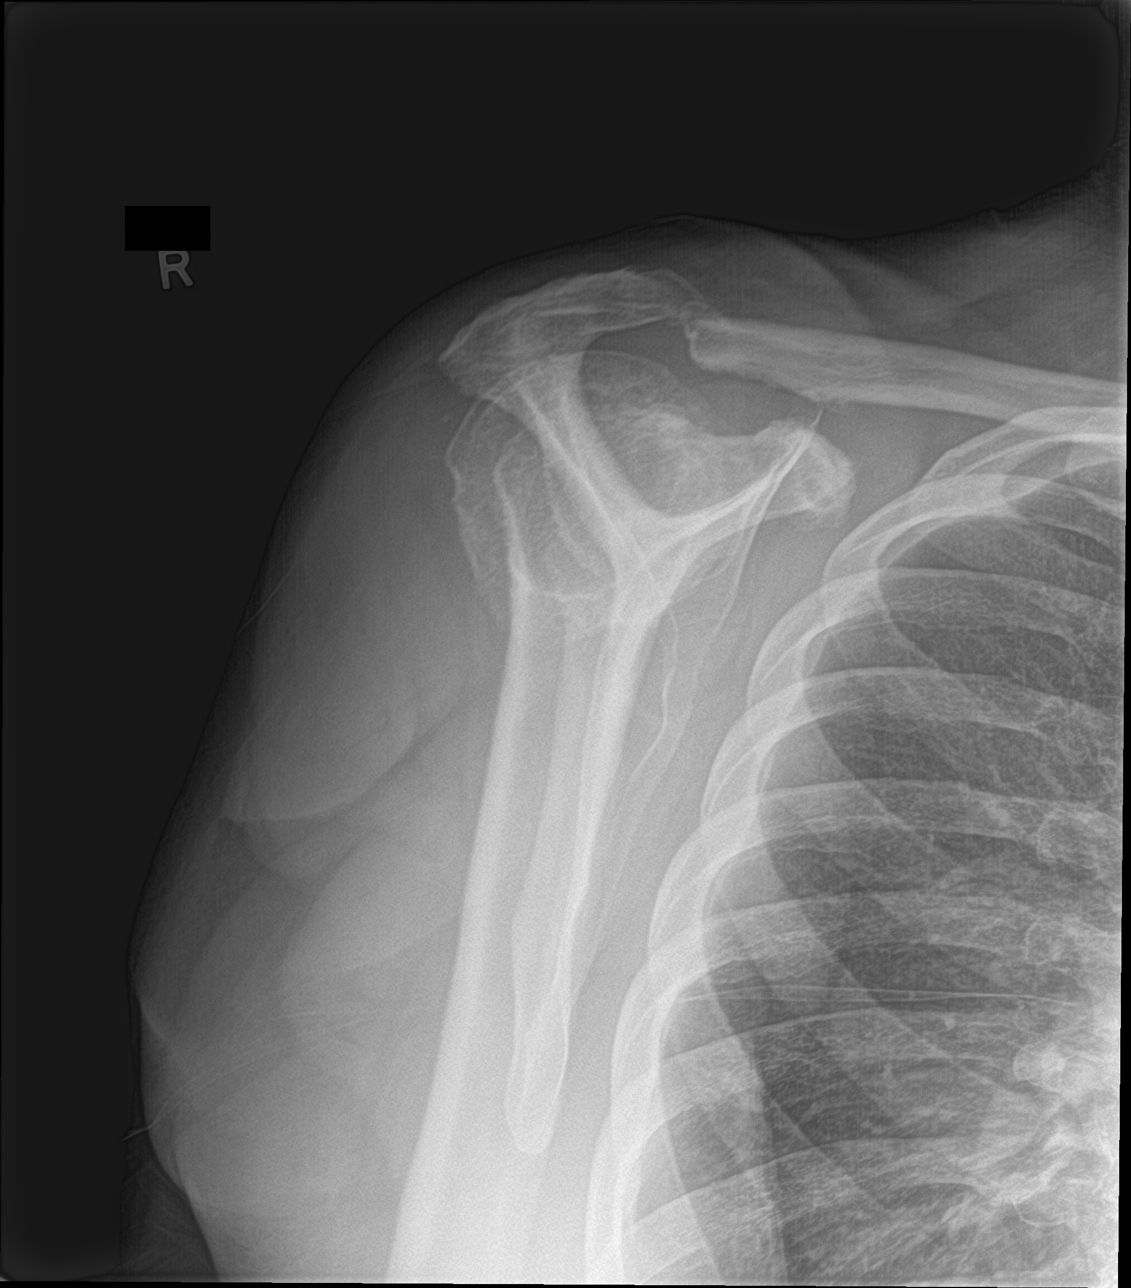

[shoulder ap neutral]
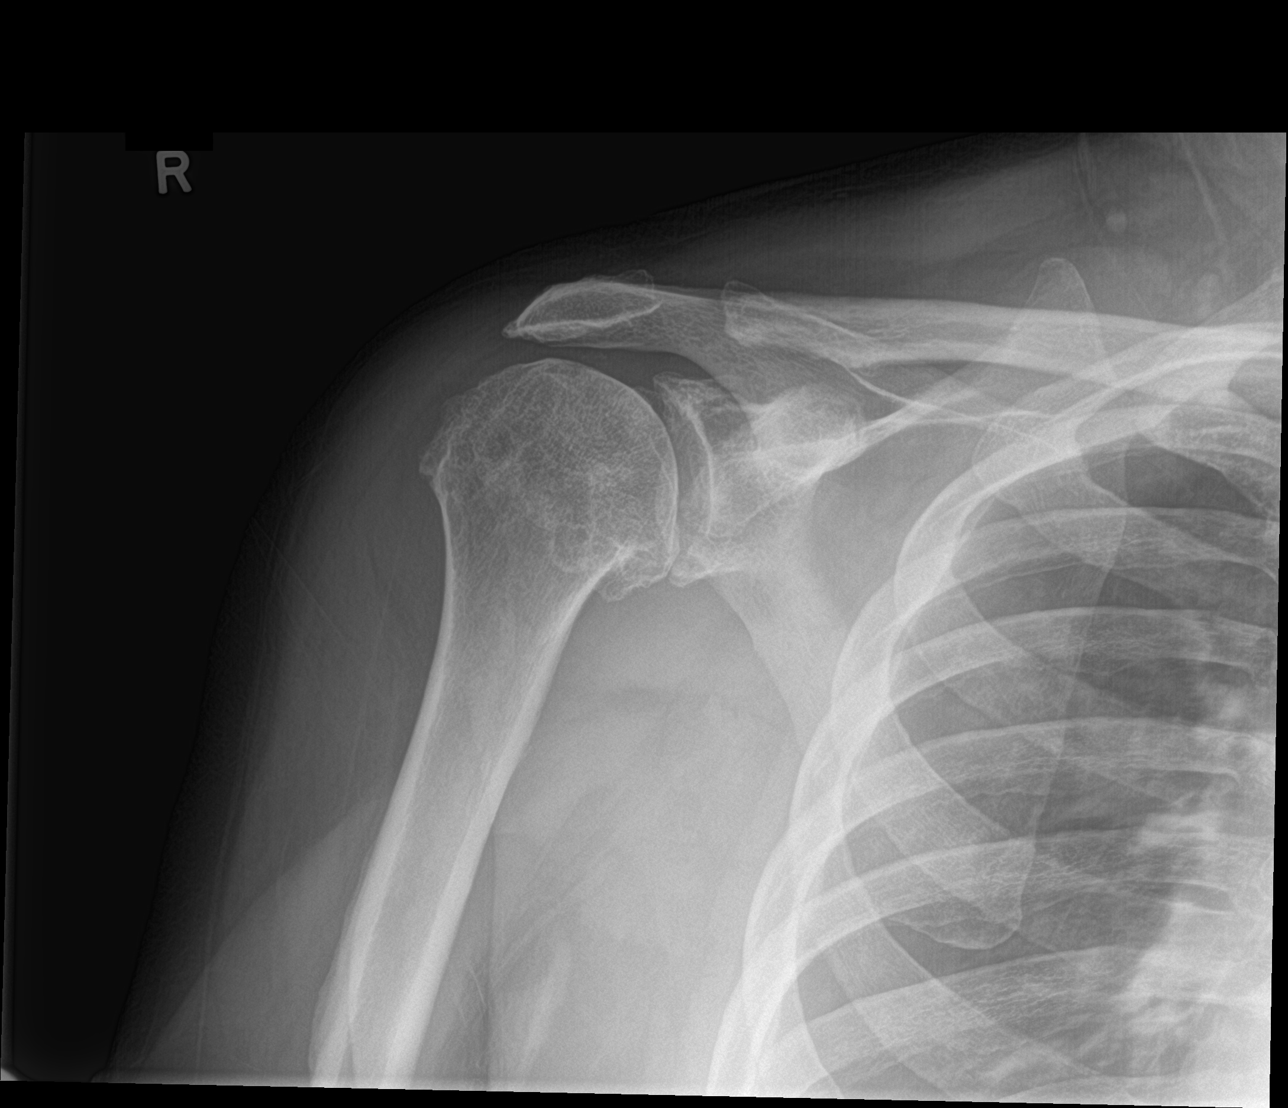

[3 of 3 positions shown; findings below may reference images not displayed]

FINDINGS: Chronically widened AC joint likely due to surgical change. No acute
fracture or malalignment. Moderate severe degenerative changes at
the glenohumeral joint with pedunculated osteophyte inferiorly.
IMPRESSION: Moderate severe degenerative changes.  No acute osseous abnormality.

## 2021-10-08 MED ORDER — HYDROCODONE-ACETAMINOPHEN 10-325 MG PO TABS: 1.0000 | ORAL_TABLET | Freq: Four times a day (QID) | ORAL | 0 refills | Status: DC | PRN

## 2021-10-08 MED ORDER — HYDROCODONE-ACETAMINOPHEN 5-325 MG PO TABS
2.0000 | ORAL_TABLET | Freq: Once | ORAL | Status: AC
  Administered 2021-10-08: 2 via ORAL
  Filled 2021-10-08: qty 2

## 2021-10-08 NOTE — ED Triage Notes (Addendum)
Pt states he missed a step and injured left knee and right shoulder last night-pain was not bad yesterday-worse this pm-NAD-to triage in w/c ?

## 2021-10-08 NOTE — ED Provider Notes (Signed)
? ?[facility] ?Provider Note: Georgena Spurling, MD, [place] ? ?CSN: [id number] ?MRN: [id number] ?ARRIVAL: 10/08/21 at 2033 ?ROOM: [facility] ? ? ?CHIEF COMPLAINT  ?Fall ? ? ?HISTORY OF PRESENT ILLNESS  ?10/08/21 10:45 PM ?Manuel Hunter is a 81 y.o. male who was at a restaurant yesterday evening and missed a step falling face forward.  He injured his left knee and his right shoulder.  He has a history of left knee replacement and right rotator cuff surgery.  He did not have significant immediate pain but throughout the day today his left prepatellar area became swollen and very painful.  He rates the pain as a 7 out of 10, worse with ambulation or palpation.  He is having lesser pain in his right shoulder but his range of motion is decreased. ? ?Past Medical History:  ?Diagnosis Date  ? Arthritis   ? Complication of anesthesia   ? States "I woke up to soon one time" with rotator cuff repair  ? GERD (gastroesophageal reflux disease)   ? Headache   ? History of hiatal hernia   ? Lower extremity edema   ? occasionally takes furosemide prn  ? Prostate cancer [facility]) 2007  ? tx with surgery  ? RLS (restless legs syndrome)   ? Rotator cuff tear   ? right  ? Sleep apnea   ? uses CPAP nightly  ? ? ?Past Surgical History:  ?Procedure Laterality Date  ? FINGER ARTHROPLASTY Left 03/10/2017  ? Procedure: Left index finger metacarpal phalangeal arthroplasty;  Surgeon: Roseanne Kaufman, MD;  Location: [facility];  Service: Orthopedics;  Laterality: Left;  90 mins  ? JOINT REPLACEMENT    ? KNEE SURGERY Bilateral   ? left knee replacment  2009  ? MANDIBLE FRACTURE SURGERY    ? left 2 titanium plates and 10 scres  ? NECK SURGERY    ? cervical with bone graft  ? PROSTATE SURGERY    ? prostatctomy  ? REVERSE SHOULDER ARTHROPLASTY Left 02/14/2020  ? Procedure: REVERSE SHOULDER ARTHROPLASTY;  Surgeon: Netta Cedars, MD;  Location: [facility];  Service: Orthopedics;  Laterality: Left;  Interscalene block  ? ROTATOR CUFF  REPAIR    ? x 2 right x 2 left  ? TOTAL HIP ARTHROPLASTY Left 09/13/2016  ? Procedure: LEFT TOTAL HIP ARTHROPLASTY ANTERIOR APPROACH;  Surgeon: [name], MD;  Location: [facility];  Service: Orthopedics;  Laterality: Left;  ? TOTAL KNEE ARTHROPLASTY Right 03/23/2018  ? Procedure: RIGHT TOTAL KNEE ARTHROPLASTY;  Surgeon: [name], MD;  Location: [facility];  Service: Orthopedics;  Laterality: Right;  ? ? ?No family history on file. ? ?Social History  ? ?Tobacco Use  ? Smoking status: Never  ? Smokeless tobacco: Never  ?Vaping Use  ? Vaping Use: Never used  ?Substance Use Topics  ? Alcohol use: Never  ? Drug use: Never  ? ? ?Prior to Admission medications   ?Medication Sig Start Date End Date Taking? Authorizing Provider  ?HYDROcodone-acetaminophen (NORCO) 10-325 MG tablet Take 1 tablet by mouth every 6 (six) hours as needed for moderate pain or severe pain. 10/08/21  Yes Melton Walls, MD  ?amoxicillin (AMOXIL) 500 MG tablet Take 2,000 mg by mouth See admin instructions. Take 2000 mg by mouth before dental procedures    [provider]  ?atorvastatin (LIPITOR) 10 MG tablet Take 10 mg by mouth daily. 12/04/19   [provider]  ?baclofen (LIORESAL) 10 MG tablet Take 1 tablet (10 mg total)  by mouth 3 (three) times daily as needed for muscle spasms. 04/29/21   [name], MD  ?furosemide (LASIX) 20 MG tablet Take 20 mg by mouth daily as needed for edema.    [provider]  ?gabapentin (NEURONTIN) 100 MG capsule TAKE 1 CAPSULE BY MOUTH TWICE A DAY 09/08/21   [name], MD  ?metFORMIN (GLUCOPHAGE-XR) 500 MG 24 hr tablet Take 500 mg by mouth daily. 01/01/20   [provider]  ?methocarbamol (ROBAXIN) 500 MG tablet Take 1 tablet (500 mg total) by mouth every 6 (six) hours as needed for muscle spasms. 06/06/18   [name], MD  ?pantoprazole (PROTONIX) 40 MG tablet Take 40 mg by mouth every morning.  09/10/14   [provider]   ?phenytoin (DILANTIN) 100 MG ER capsule Take 100-200 mg by mouth See admin instructions. Take 100 mg by mouth in the morning and 200 mg at night 09/01/15   [provider]  ?rOPINIRole (REQUIP) 1 MG tablet Take 1 mg by mouth 3 (three) times daily with meals.     [provider]  ?rOPINIRole (REQUIP) 3 MG tablet Take 3 mg by mouth at bedtime.    [provider]  ?zolpidem (AMBIEN) 5 MG tablet Take 5 mg by mouth at bedtime as needed for sleep.    [provider]  ? ? ?Allergies ?Morphine ? ? ?REVIEW OF SYSTEMS  ?Negative except as noted here or in the History of Present Illness. ? ? ?PHYSICAL EXAMINATION  ?Initial Vital Signs ?Blood pressure (!) 130/102, pulse (!) 59, temperature 98.3 ?F (36.8 ?C), temperature source Oral, resp. rate 20, height '5\' 6"'$  (1.676 m), weight 108.9 kg, SpO2 97 %. ? ?Examination ?General: Well-developed, well-nourished male in no acute distress; appearance consistent with age of record ?HENT: normocephalic; atraumatic ?Eyes: pupils equal, round and reactive to light; extraocular muscles intact ?Neck: supple ?Heart: regular rate and rhythm ?Lungs: clear to auscultation bilaterally ?Abdomen: soft; nondistended; nontender; bowel sounds present ?Extremities: Pain on attempted movement of right shoulder with decreased range of motion, right upper extremity distally neurovascularly intact; tenderness, swelling and ecchymosis of left prepatellar area: ? ? ? ?Neurologic: Awake, alert and oriented; motor function intact in all extremities and symmetric; no facial droop ?Skin: Warm and dry ?Psychiatric: Normal mood and affect ? ? ?RESULTS  ?Summary of this visit's results, reviewed and interpreted by myself: ? ? EKG Interpretation ? ?Date/Time:    ?Ventricular Rate:    ?PR Interval:    ?QRS Duration:   ?QT Interval:    ?QTC Calculation:   ?R Axis:     ?Text Interpretation:   ?  ? ?  ? ?Laboratory Studies: ?No results found for this or any previous visit (from the past  24 hour(s)). ?Imaging Studies: ?DG Shoulder Right ? ?Result Date: 10/08/2021 ?CLINICAL DATA:  Shoulder injury EXAM: RIGHT SHOULDER - 2+ VIEW COMPARISON:  Chest x-ray 09/22/2003 FINDINGS: Chronically widened AC joint likely due to surgical change. No acute fracture or malalignment. Moderate severe degenerative changes at the glenohumeral joint with pedunculated osteophyte inferiorly. IMPRESSION: Moderate severe degenerative changes.  No acute osseous abnormality. Electronically Signed   By: Donavan Foil M.D.   On: 10/08/2021 21:46  ? ?DG Knee Complete 4 Views Left ? ?Result Date: 10/08/2021 ?CLINICAL DATA:  Injury EXAM: LEFT KNEE - COMPLETE 4+ VIEW COMPARISON:  11/16/2009 FINDINGS: Frontal, bilateral oblique, and cross-table lateral views of the left knee are obtained. Three component left knee arthroplasty is identified in the  expected position without signs of acute complication. There are no acute or destructive bony lesions. No joint effusion. Marked prepatellar soft tissue edema. IMPRESSION: 1. Prepatellar soft tissue swelling. 2. No acute fracture. 3. Unremarkable left knee arthroplasty. Electronically Signed   By: Randa Ngo M.D.   On: 10/08/2021 21:46   ? ?ED COURSE and MDM  ?Nursing notes, initial and subsequent vitals signs, including pulse oximetry, reviewed and interpreted by myself. ? ?Vitals:  ? 10/08/21 2046 10/08/21 2049  ?BP:  (!) 130/102  ?Pulse:  (!) 59  ?Resp:  20  ?Temp:  98.3 ?F (36.8 ?C)  ?TempSrc:  Oral  ?SpO2:  97%  ?Weight: 108.9 kg   ?Height: '5\' 6"'$  (1.676 m)   ? ?Medications  ?HYDROcodone-acetaminophen (NORCO/VICODIN) 5-325 MG per tablet 2 tablet (has no administration in time range)  ? ?No evidence of fracture or prosthetic damage on radiographs.  The patient states he is able to ambulate without assistance.  The patient's rotator cuff issues are followed by Dr. Veverly Fells of [facility].  His knee replacements were done by Dr. Jean Rosenthal.  He will follow-up with these physicians  for his respective injuries.  Patient declined aspiration of his left prepatellar bursa in the ED. ? ?PROCEDURES  ?Procedures ? ? ?ED DIAGNOSES  ? ?  ICD-10-CM   ?1. Fall from slip, trip, or stumble, initi

## 2021-10-13 ENCOUNTER — Encounter: Payer: Self-pay | Admitting: Orthopaedic Surgery

## 2021-10-13 ENCOUNTER — Other Ambulatory Visit: Payer: Self-pay

## 2021-10-13 ENCOUNTER — Ambulatory Visit (INDEPENDENT_AMBULATORY_CARE_PROVIDER_SITE_OTHER): Payer: Medicare HMO | Admitting: Orthopaedic Surgery

## 2021-10-13 DIAGNOSIS — M7052 Other bursitis of knee, left knee: Secondary | ICD-10-CM

## 2021-10-13 NOTE — Progress Notes (Signed)
Manuel Hunter comes in today for follow-up after he had a recent fall.  He had to go to [place] and x-rays were obtained of his left knee and his right shoulder.  [facility] has been seen at [facility] for his right [facility].  He has a remote history of a left knee replacement done elsewhere.  I replaced his right knee.  He did develop swelling over his prepatellar area of his left knee.  X-rays were obtained when he went to the emergency room and they were negative for fracture.  He is on blood thinning medication and this likely contributed to the hematoma.  He had just an accidental mechanical fall.  He is feeling better and walking around better.  It was about 24 hours after he fell when the pain came quite significant with swelling with his left knee.  He said it really swelled up to the size of a golf ball. ? ?On exam he does have pain over the prepatellar area and some fullness in the soft tissue but no redness.  His extensor mechanism is intact and he has good range of motion of his left knee.  There is no evidence of infection. ? ?This does seem to be more of a hematoma.  I try to aspirate fluid from the prepatellar area with a 18-gauge needle but this was negative and showed more of just hematoma. ? ?I recommended heat intermittently and just time.  This should resolve with time.  He says is already smaller over the last few days.  All questions and concerns were answered and addressed.  I did go over his x-rays with him as well.  Follow-up is as needed. ?

## 2021-11-15 ENCOUNTER — Telehealth: Payer: Self-pay | Admitting: Orthopaedic Surgery

## 2021-11-15 NOTE — Telephone Encounter (Signed)
Please call the pt with a sooner appt. The pt cant locate Dr Adela Ports cell phone ---I made an appt, but the pt states Dr Ninfa Linden --will do better to get him seen . ?

## 2021-11-15 NOTE — Telephone Encounter (Signed)
11-17-21 @ 9:30 called and left msg ?

## 2021-11-17 ENCOUNTER — Ambulatory Visit: Payer: Medicare HMO | Admitting: Orthopaedic Surgery

## 2021-11-23 ENCOUNTER — Ambulatory Visit: Payer: Medicare HMO | Admitting: Orthopaedic Surgery

## 2021-11-24 ENCOUNTER — Encounter: Payer: Self-pay | Admitting: Orthopaedic Surgery

## 2021-11-24 ENCOUNTER — Ambulatory Visit: Payer: Medicare HMO | Admitting: Orthopaedic Surgery

## 2021-11-24 ENCOUNTER — Other Ambulatory Visit: Payer: Self-pay

## 2021-11-24 ENCOUNTER — Ambulatory Visit (INDEPENDENT_AMBULATORY_CARE_PROVIDER_SITE_OTHER): Payer: Medicare HMO

## 2021-11-24 DIAGNOSIS — R1031 Right lower quadrant pain: Secondary | ICD-10-CM | POA: Diagnosis not present

## 2021-11-24 NOTE — Progress Notes (Signed)
Manuel Hunter comes in today with worsening right hip and groin pain this been hurting for about 3 to 4 weeks now with no known injury.  He actually has a history of both knees being replaced and his left hip replaced.  He hurts mainly in the groin on the right side.  He does have a cane and walker at home but he has been reluctant to use them. ? ?I can put his right hip through internal extra rotation but is painful to do so. ? ?An AP pelvis and lateral right hip shows just some slight joint space narrowing when compared to films from back in 2018.  There is no acute findings or fracture.  There is just some mild arthritic changes. ? ?I think the next best step for him will be trying an intra-articular injection under fluoroscopy by Dr. Ernestina Patches in that right hip joint.  He should definitely use a cane to offload the hip in the interim.  I can then see him back a week or 2 after that injection. ?

## 2021-11-29 ENCOUNTER — Telehealth: Payer: Self-pay | Admitting: Physical Medicine and Rehabilitation

## 2021-11-29 NOTE — Telephone Encounter (Signed)
Patient called asked for a call back for a sooner appointment. The number to contact patient is [phone] ?

## 2021-12-02 ENCOUNTER — Encounter: Payer: Self-pay | Admitting: Physical Medicine and Rehabilitation

## 2021-12-02 ENCOUNTER — Ambulatory Visit (INDEPENDENT_AMBULATORY_CARE_PROVIDER_SITE_OTHER): Payer: Medicare HMO | Admitting: Physical Medicine and Rehabilitation

## 2021-12-02 ENCOUNTER — Ambulatory Visit: Payer: Self-pay

## 2021-12-02 DIAGNOSIS — M25551 Pain in right hip: Secondary | ICD-10-CM

## 2021-12-02 NOTE — Progress Notes (Signed)
Pt state right hip pain. Pt state walking, standing and laying down makes the pain worse. Pt state he takes over the counter pain meds to help ease his pain.  Numeric Pain Rating Scale and Functional Assessment Average Pain 7   In the last MONTH (on 0-10 scale) has pain interfered with the following?  1. General activity like being  able to carry out your everyday physical activities such as walking, climbing stairs, carrying groceries, or moving a chair?  Rating(10)   +Driver, +BT, -Dye Allergies.

## 2021-12-14 ENCOUNTER — Other Ambulatory Visit: Payer: Self-pay | Admitting: Orthopaedic Surgery

## 2021-12-14 ENCOUNTER — Other Ambulatory Visit: Payer: Self-pay

## 2021-12-14 DIAGNOSIS — R1031 Right lower quadrant pain: Secondary | ICD-10-CM

## 2021-12-14 DIAGNOSIS — M25551 Pain in right hip: Secondary | ICD-10-CM

## 2021-12-14 MED ORDER — HYDROCODONE-ACETAMINOPHEN 10-325 MG PO TABS: 1.0000 | ORAL_TABLET | Freq: Four times a day (QID) | ORAL | 0 refills | Status: DC | PRN

## 2021-12-15 MED ORDER — TRIAMCINOLONE ACETONIDE 40 MG/ML IJ SUSP
60.0000 mg | INTRAMUSCULAR | Status: AC | PRN
  Administered 2021-12-02: 60 mg via INTRA_ARTICULAR

## 2021-12-15 MED ORDER — BUPIVACAINE HCL 0.25 % IJ SOLN
4.0000 mL | INTRAMUSCULAR | Status: AC | PRN
Start: 2021-12-02 — End: 2021-12-02
  Administered 2021-12-02: 4 mL via INTRA_ARTICULAR

## 2021-12-15 NOTE — Progress Notes (Signed)
   Manuel Hunter - 81 y.o. male MRN [id number]  Date of birth: 1940/11/10  Office Visit Note: Visit Date: 12/02/2021 PCP: [facility] Referred by: [facility]  Subjective: Chief Complaint  Patient presents with   Right Hip - Pain   HPI:  Manuel Hunter is a 81 y.o. male who comes in today at the request of Dr. Jean Rosenthal for planned Right anesthetic hip arthrogram with fluoroscopic guidance.  The patient has failed conservative care including home exercise, medications, time and activity modification.  This injection will be diagnostic and hopefully therapeutic.  Please see requesting physician notes for further details and justification.  ROS Otherwise per HPI.  Assessment & Plan: Visit Diagnoses:    ICD-10-CM   1. Pain in right hip  M25.551 XR C-ARM NO REPORT      Plan: No additional findings.   Meds & Orders: No orders of the defined types were placed in this encounter.   Orders Placed This Encounter  Procedures   Large Joint Inj   XR C-ARM NO REPORT    Follow-up: Return for visit to requesting provider as needed.   Procedures: Large Joint Inj: R hip joint on 12/02/2021 10:15 AM Indications: diagnostic evaluation and pain Details: 22 G 3.5 in needle, fluoroscopy-guided anterior approach  Arthrogram: No  Medications: 4 mL bupivacaine 0.25 %; 60 mg triamcinolone acetonide 40 MG/ML Outcome: tolerated well, no immediate complications  There was excellent flow of contrast producing a partial arthrogram of the hip. The patient did have relief of symptoms during the anesthetic phase of the injection. Procedure, treatment alternatives, risks and benefits explained, specific risks discussed. Consent was given by the patient. Immediately prior to procedure a time out was called to verify the correct patient, procedure, equipment, support staff and site/side marked as required. Patient was prepped and draped in the usual sterile fashion.          Clinical History: No specialty comments available.     Objective:  VS:  HT:    WT:   BMI:     BP:   HR: bpm  TEMP: ( )  RESP:  Physical Exam   Imaging: No results found.

## 2021-12-21 ENCOUNTER — Ambulatory Visit: Payer: Medicare HMO | Admitting: Physical Medicine and Rehabilitation

## 2021-12-21 ENCOUNTER — Ambulatory Visit
Admission: RE | Admit: 2021-12-21 | Discharge: 2021-12-21 | Disposition: A | Discharge status: Home / Self Care | Payer: Medicare HMO | Source: Ambulatory Visit | Attending: Orthopaedic Surgery | Admitting: Orthopaedic Surgery

## 2021-12-21 DIAGNOSIS — R1031 Right lower quadrant pain: Secondary | ICD-10-CM

## 2021-12-21 DIAGNOSIS — M25551 Pain in right hip: Secondary | ICD-10-CM

## 2021-12-21 IMAGING — MR MR HIP*R* W/O CM
5 series · 32 of 40 positions shown · non-contrast
Comparison: Radiographs [DATE]

CLINICAL DATA: Right hip pain for 2 months.

EXAM:
MR OF THE RIGHT HIP WITHOUT CONTRAST
TECHNIQUE: Multiplanar, multisequence MR imaging was performed. No intravenous
contrast was administered.

[Series 4: T1 · coronal · 4.0mm · 1.25mm/px · 4 of 14 slices shown]
[im 1/14]
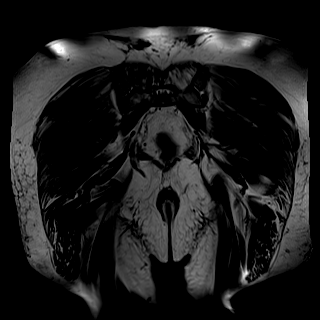
[im 5/14]
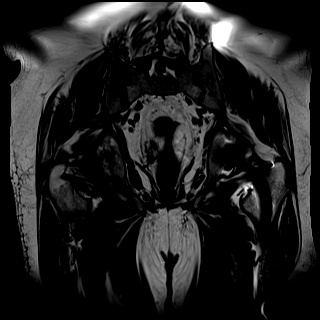
[im 9/14]
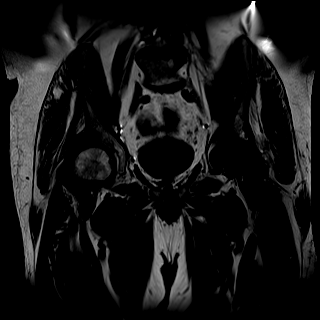
[im 14/14]
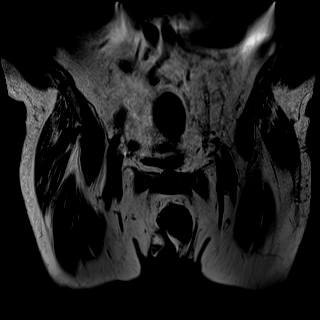

[Series 5: STIR · coronal · 4.0mm · 0.78mm/px · 8 of 28 slices shown (1 of 2)]
[im 1/28]
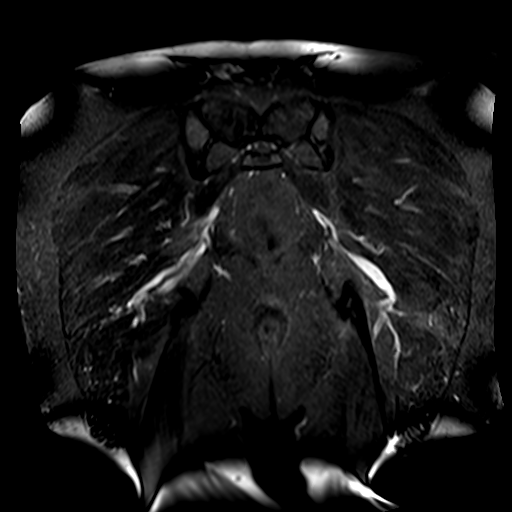
[im 4/28]
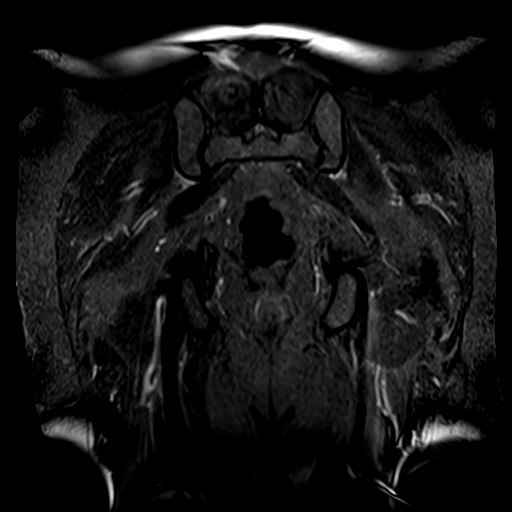
[im 8/28]
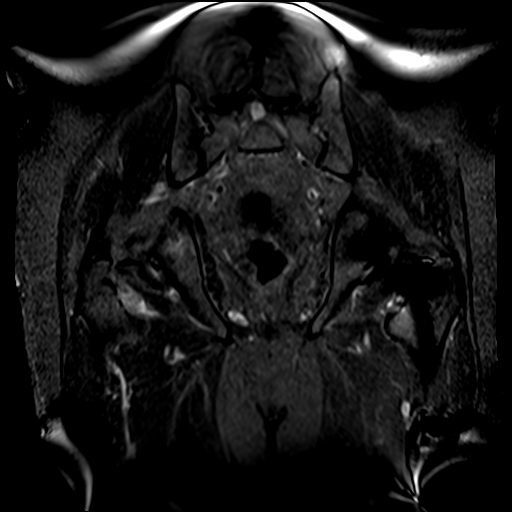
[im 12/28]
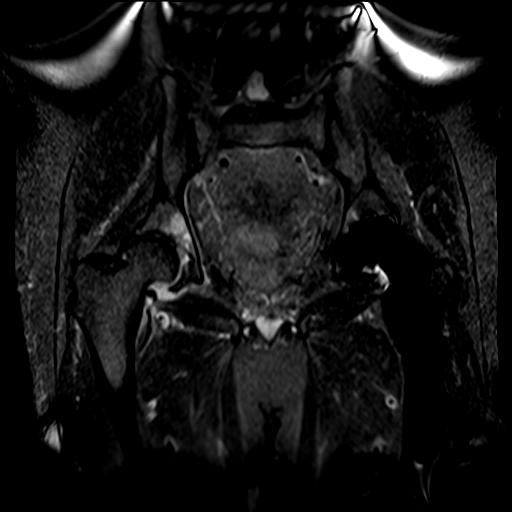
[im 16/28]
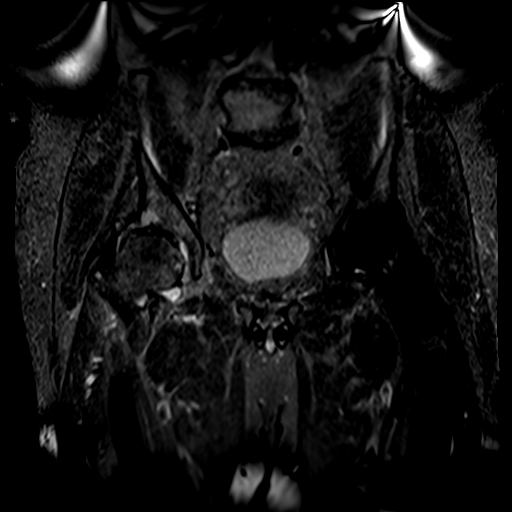
[im 20/28]
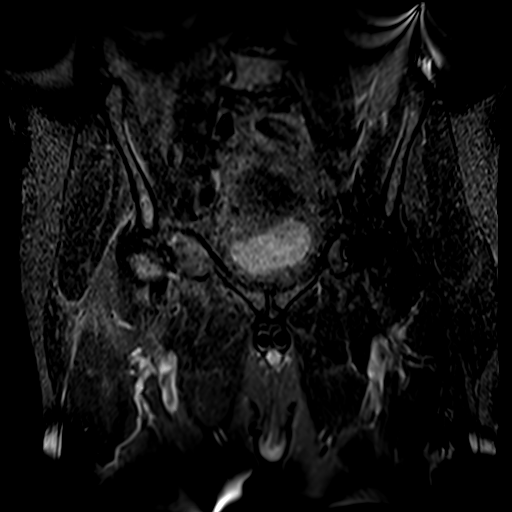
[im 24/28]
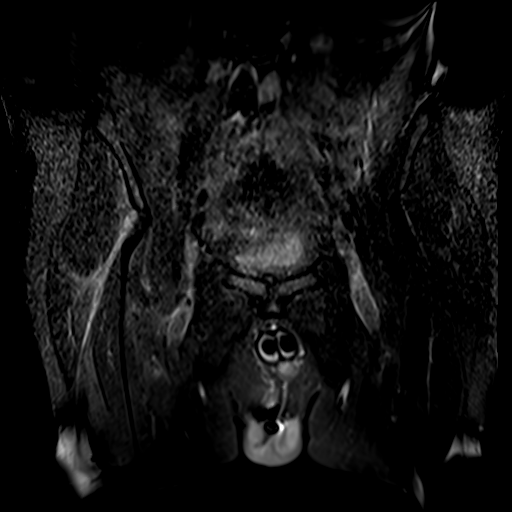
[im 28/28]
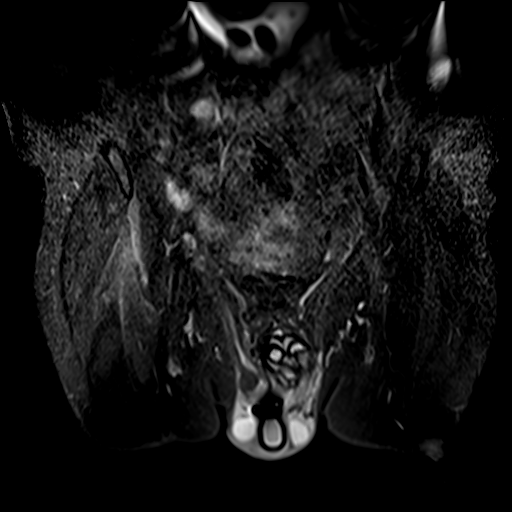

[Series 6: PD fat-sat · sagittal · 4.0mm · 0.82mm/px · 8 of 30 slices shown (1 of 2)]
[im 1/30]
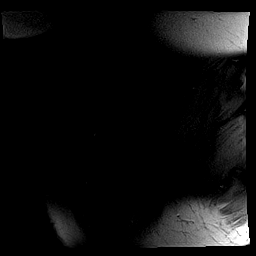
[im 5/30]
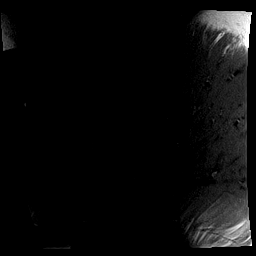
[im 9/30]
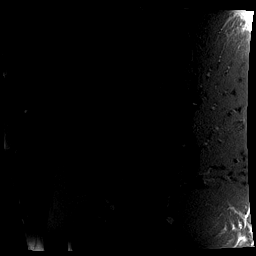
[im 13/30]
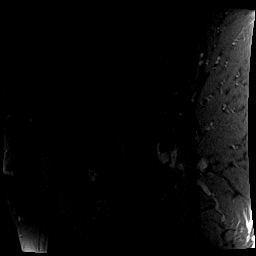
[im 17/30]
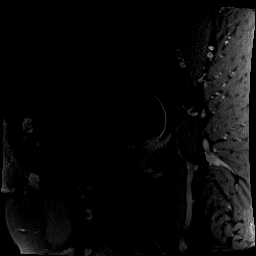
[im 21/30]
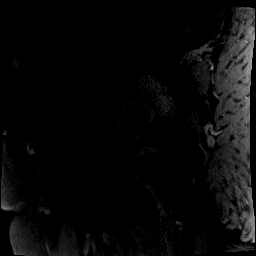
[im 25/30]
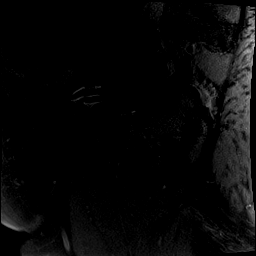
[im 30/30]
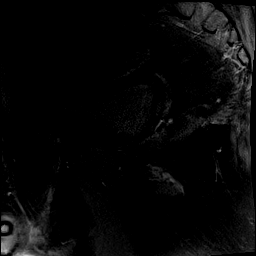

[Series 7: PD fat-sat · coronal · 4.0mm · 0.78mm/px · 7 of 24 slices shown (2 of 2)]
[im 1/24]
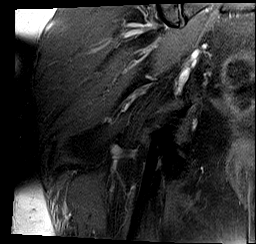
[im 4/24]
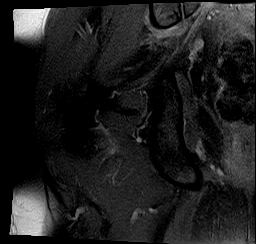
[im 8/24]
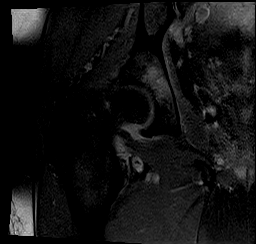
[im 12/24]
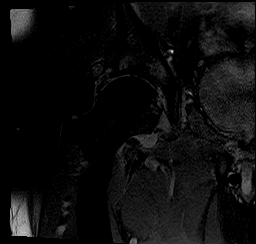
[im 16/24]
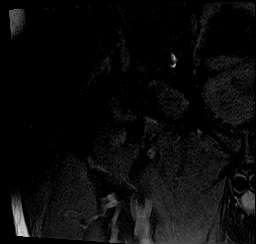
[im 20/24]
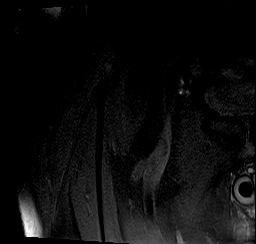
[im 24/24]
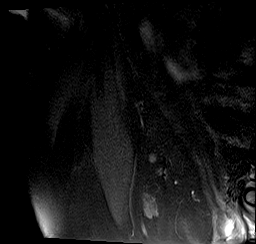

[Series 8: STIR · axial · 4.0mm · 0.70mm/px · z∈[-47,+82]mm · 5 of 46 slices shown (2 of 2)]
[im 1/46]
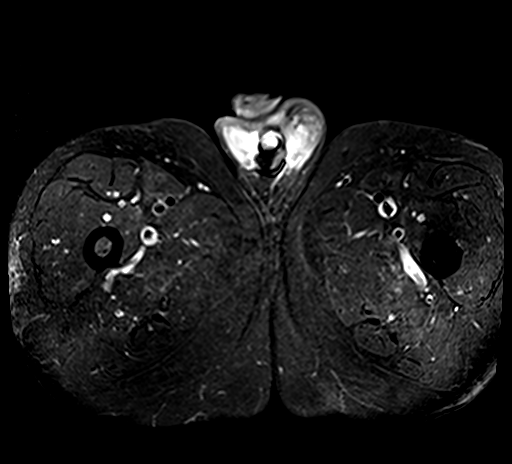
[im 8/46]
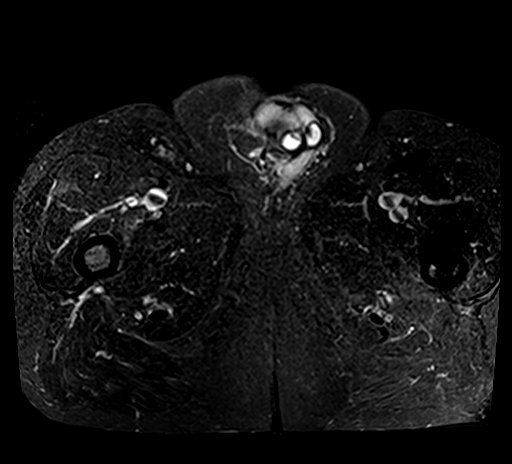
[im 16/46]
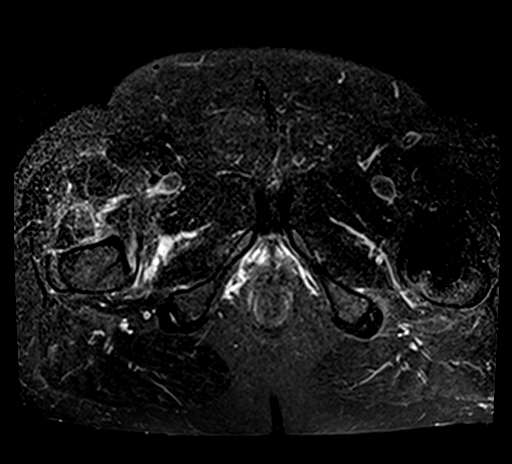
[im 19/46]
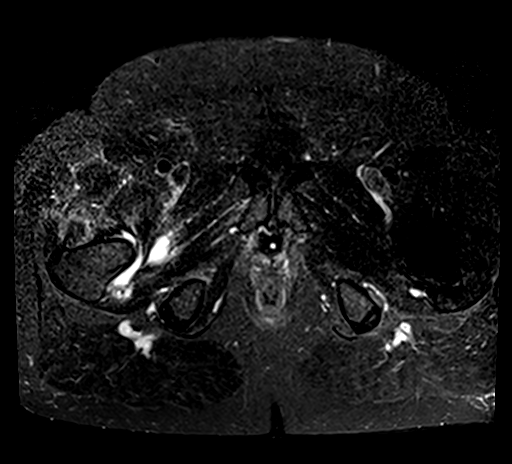
[im 27/46]
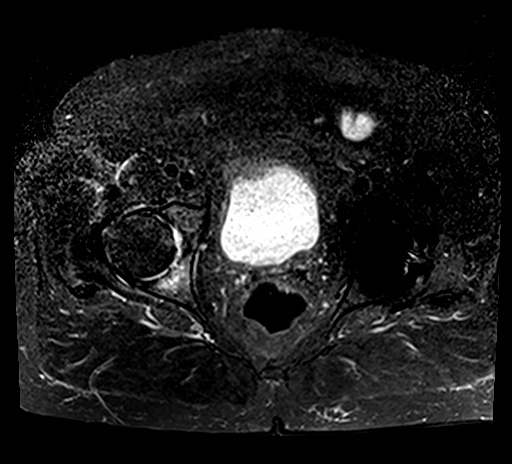

[32 of 40 positions shown; findings below may reference images not displayed]

FINDINGS: Left total hip arthroplasty noted with associated artifact. No
complicating features are identified.

Advanced right hip joint degenerative changes with areas of full or
near full-thickness cartilage loss, joint space narrowing and early
osteophytic spurring. There is a small to moderate-sized right hip
joint effusion. The superior labrum is degenerated and torn and
there is an associated paralabral cyst.

There are areas of moderate marrow edema mainly involving the
acetabulum likely stress related process without discrete stress
fracture. Similar findings along the anterior aspect of the right
femoral head. No findings for AVN.

The other bony structures are unremarkable. No bone lesions are
identified.

Mild edema like signal changes involving the right hip musculature
possibly stress related myositis or muscle strain. The hamstring
tendons are intact.

No significant intrapelvic abnormalities are identified. The
prostate gland and seminal vesicles are surgically absent. No pelvic
mass or adenopathy. A penile prosthesis is noted.
IMPRESSION: 1. Advanced right hip joint degenerative changes as described above.
Associated stress related marrow edema in the acetabulum and femoral
head. No discrete stress fracture.
2. Degenerated and torn superior labrum with an associated
paralabral cyst.
3. Left total hip arthroplasty without complicating features.
4. Mild edema like signal changes involving the right hip
musculature possibly stress related myositis or muscle strain.

## 2021-12-24 ENCOUNTER — Other Ambulatory Visit: Payer: Self-pay | Admitting: Orthopaedic Surgery

## 2021-12-27 ENCOUNTER — Encounter: Payer: Self-pay | Admitting: Orthopaedic Surgery

## 2021-12-27 ENCOUNTER — Ambulatory Visit: Payer: Medicare HMO | Admitting: Orthopaedic Surgery

## 2021-12-27 DIAGNOSIS — M25551 Pain in right hip: Secondary | ICD-10-CM | POA: Diagnosis not present

## 2021-12-27 DIAGNOSIS — M1611 Unilateral primary osteoarthritis, right hip: Secondary | ICD-10-CM

## 2021-12-27 MED ORDER — HYDROCODONE-ACETAMINOPHEN 10-325 MG PO TABS: 1.0000 | ORAL_TABLET | Freq: Four times a day (QID) | ORAL | 0 refills | Status: DC | PRN

## 2021-12-27 NOTE — Progress Notes (Signed)
Manuel Hunter comes in today to go over the MRI of his right hip.  He has severe debilitating pain with the right hip.  An intra-articular steroid injection did not help.  Offloading the hip using a walker and cane has helped a little bit.  He still has a lot of pain in that hip with pivoting activities and getting in and out of a car and other activities as well.  We have replaced his left hip and he has a history of both his knees being replaced.  His right hip shows severe pain with internal and external rotation.  The MRI of his right hip does show advanced degenerative changes in the right hip joint.  There is degenerative tearing of the labrum as well.  There is myositis around the muscles and edema in the femoral head and acetabulum showing the acute on chronic nature of his pain and symptoms.  At this point he is interested in hip replacement surgery and I have recommended this as well based on his clinical exam and MRI findings.  I will send in some more pain medication in the interim.  He is on Eliquis and knows to stop it 3 days prior to surgery.  We will work on getting this scheduled.  In the interim he will still offload his hip is much as he can using a walker.

## 2021-12-28 ENCOUNTER — Other Ambulatory Visit: Payer: Medicare HMO

## 2021-12-30 ENCOUNTER — Ambulatory Visit: Payer: Medicare HMO | Admitting: Physician Assistant

## 2022-01-03 ENCOUNTER — Other Ambulatory Visit: Payer: Self-pay

## 2022-01-04 ENCOUNTER — Other Ambulatory Visit: Payer: Self-pay | Admitting: Physician Assistant

## 2022-01-04 DIAGNOSIS — M1611 Unilateral primary osteoarthritis, right hip: Secondary | ICD-10-CM

## 2022-01-07 ENCOUNTER — Other Ambulatory Visit: Payer: Self-pay

## 2022-01-11 ENCOUNTER — Telehealth: Payer: Self-pay

## 2022-01-11 NOTE — Telephone Encounter (Signed)
Patient left message wanting Manuel Hunter with [facility] health, formerly [place], to do his rehab.  His surgery is scheduled for 01/20/22.

## 2022-01-12 NOTE — Patient Instructions (Signed)
DUE TO COVID-19 ONLY TWO VISITORS  (aged 81 and older)  ARE ALLOWED TO COME WITH YOU AND STAY IN THE WAITING ROOM ONLY DURING PRE OP AND PROCEDURE.   **NO VISITORS ARE ALLOWED IN THE SHORT STAY AREA OR RECOVERY ROOM!!**  IF YOU WILL BE ADMITTED INTO THE HOSPITAL YOU ARE ALLOWED ONLY FOUR SUPPORT PEOPLE DURING VISITATION HOURS ONLY (7 AM -8PM)   The support person(s) must pass our screening, gel in and out, and wear a mask at all times, including in the patient's room. Patients must also wear a mask when staff or their support person are in the room. Visitors GUEST BADGE MUST BE WORN VISIBLY  One adult visitor may remain with you overnight and MUST be in the room by 8 P.M.     Your procedure is scheduled on: 01/20/22   Report to [facility] Main Entrance    Report to admitting at : 7:30 AM   Call this number if you have problems the morning of surgery [phone]   Do not eat food :After Midnight.   After Midnight you may have the following liquids until : 6:30 AM DAY OF SURGERY  Water Black Coffee (sugar ok, NO MILK/CREAM OR CREAMERS)  Tea (sugar ok, NO MILK/CREAM OR CREAMERS) regular and decaf                             Plain Jell-O (NO RED)                                           Fruit ices (not with fruit pulp, NO RED)                                     Popsicles (NO RED)                                                                  Juice: apple, WHITE grape, WHITE cranberry Sports drinks like Gatorade (NO RED) Clear broth(vegetable,chicken,beef)  Drink G2 drink AT : 6:30 AM the day of surgery.      The day of surgery:  Drink ONE (1) Pre-Surgery Clear Ensure or G2 at AM the morning of surgery. Drink in one sitting. Do not sip.  This drink was given to you during your hospital  pre-op appointment visit. Nothing else to drink after completing the  Pre-Surgery Clear Ensure or G2.          If you have questions, please contact your surgeon's office.    Oral  Hygiene is also important to reduce your risk of infection.                                    Remember - BRUSH YOUR TEETH THE MORNING OF SURGERY WITH YOUR REGULAR TOOTHPASTE   Do NOT smoke after Midnight   Take these medicines the morning of surgery with A SIP OF WATER: ropinirole,pantoprazole. How to Manage Your Diabetes  Before and After Surgery  Why is it important to control my blood sugar before and after surgery? Improving blood sugar levels before and after surgery helps healing and can limit problems. A way of improving blood sugar control is eating a healthy diet by:  Eating less sugar and carbohydrates  Increasing activity/exercise  Talking with your doctor about reaching your blood sugar goals High blood sugars (greater than 180 mg/dL) can raise your risk of infections and slow your recovery, so you will need to focus on controlling your diabetes during the weeks before surgery. Make sure that the doctor who takes care of your diabetes knows about your planned surgery including the date and location.  How do I manage my blood sugar before surgery? Check your blood sugar at least 4 times a day, starting 2 days before surgery, to make sure that the level is not too high or low. Check your blood sugar the morning of your surgery when you wake up and every 2 hours until you get to the Short Stay unit. If your blood sugar is less than 70 mg/dL, you will need to treat for low blood sugar: Do not take insulin. Treat a low blood sugar (less than 70 mg/dL) with  cup of clear juice (cranberry or apple), 4 glucose tablets, OR glucose gel. Recheck blood sugar in 15 minutes after treatment (to make sure it is greater than 70 mg/dL). If your blood sugar is not greater than 70 mg/dL on recheck, call [phone] for further instructions. Report your blood sugar to the short stay nurse when you get to Short Stay.  If you are admitted to the hospital after surgery: Your blood sugar will be  checked by the staff and you will probably be given insulin after surgery (instead of oral diabetes medicines) to make sure you have good blood sugar levels. The goal for blood sugar control after surgery is 80-180 mg/dL.   WHAT DO I DO ABOUT MY DIABETES MEDICATION?  Do not take oral diabetes medicines (pills) the morning of surgery.  THE DAY BEFORE SURGERY, take metformin as usual.      THE MORNING OF SURGERY, DO NOT TAKE ANY ORAL DIABETIC MEDICATIONS DAY OF YOUR SURGERY  Bring CPAP mask and tubing day of surgery.                              You may not have any metal on your body including hair pins, jewelry, and body piercing             Do not wear lotions, powders, perfumes/cologne, or deodorant              Men may shave face and neck.   Do not bring valuables to the hospital. [place].   Contacts, dentures or bridgework may not be worn into surgery.   Bring small overnight bag day of surgery.   DO NOT [place]. PHARMACY WILL DISPENSE MEDICATIONS LISTED ON YOUR MEDICATION LIST TO YOU DURING YOUR ADMISSION [place]!    Patients discharged on the day of surgery will not be allowed to drive home.  Someone NEEDS to stay with you for the first 24 hours after anesthesia.   Special Instructions: Bring a copy of your healthcare power of attorney and living will documents  the day of surgery if you haven't scanned them before.              Please read over the following fact sheets you were given: IF YOU HAVE QUESTIONS ABOUT YOUR PRE-OP INSTRUCTIONS PLEASE CALL [phone]     [facility] - Preparing for Surgery Before surgery, you can play an important role.  Because skin is not sterile, your skin needs to be as free of germs as possible.  You can reduce the number of germs on your skin by washing with CHG (chlorahexidine gluconate) soap before surgery.  CHG is an antiseptic cleaner  which kills germs and bonds with the skin to continue killing germs even after washing. Please DO NOT use if you have an allergy to CHG or antibacterial soaps.  If your skin becomes reddened/irritated stop using the CHG and inform your nurse when you arrive at Short Stay. Do not shave (including legs and underarms) for at least 48 hours prior to the first CHG shower.  You may shave your face/neck. Please follow these instructions carefully:  1.  Shower with CHG Soap the night before surgery and the  morning of Surgery.  2.  If you choose to wash your hair, wash your hair first as usual with your  normal  shampoo.  3.  After you shampoo, rinse your hair and body thoroughly to remove the  shampoo.                           4.  Use CHG as you would any other liquid soap.  You can apply chg directly  to the skin and wash                       Gently with a scrungie or clean washcloth.  5.  Apply the CHG Soap to your body ONLY FROM THE NECK DOWN.   Do not use on face/ open                           Wound or open sores. Avoid contact with eyes, ears mouth and genitals (private parts).                       Wash face,  Genitals (private parts) with your normal soap.             6.  Wash thoroughly, paying special attention to the area where your surgery  will be performed.  7.  Thoroughly rinse your body with warm water from the neck down.  8.  DO NOT shower/wash with your normal soap after using and rinsing off  the CHG Soap.                9.  Pat yourself dry with a clean towel.            10.  Wear clean pajamas.            11.  Place clean sheets on your bed the night of your first shower and do not  sleep with pets. Day of Surgery : Do not apply any lotions/deodorants the morning of surgery.  Please wear clean clothes to the hospital/surgery center.  FAILURE TO FOLLOW THESE INSTRUCTIONS MAY RESULT IN THE CANCELLATION OF YOUR SURGERY PATIENT SIGNATURE_________________________________  NURSE  SIGNATURE__________________________________  ________________________________________________________________________   Adam Phenix  An incentive spirometer is  a tool that can help keep your lungs clear and active. This tool measures how well you are filling your lungs with each breath. Taking long deep breaths may help reverse or decrease the chance of developing breathing (pulmonary) problems (especially infection) following: A long period of time when you are unable to move or be active. BEFORE THE PROCEDURE  If the spirometer includes an indicator to show your best effort, your nurse or respiratory therapist will set it to a desired goal. If possible, sit up straight or lean slightly forward. Try not to slouch. Hold the incentive spirometer in an upright position. INSTRUCTIONS FOR USE  Sit on the edge of your bed if possible, or sit up as far as you can in bed or on a chair. Hold the incentive spirometer in an upright position. Breathe out normally. Place the mouthpiece in your mouth and seal your lips tightly around it. Breathe in slowly and as deeply as possible, raising the piston or the ball toward the top of the column. Hold your breath for 3-5 seconds or for as long as possible. Allow the piston or ball to fall to the bottom of the column. Remove the mouthpiece from your mouth and breathe out normally. Rest for a few seconds and repeat Steps 1 through 7 at least 10 times every 1-2 hours when you are awake. Take your time and take a few normal breaths between deep breaths. The spirometer may include an indicator to show your best effort. Use the indicator as a goal to work toward during each repetition. After each set of 10 deep breaths, practice coughing to be sure your lungs are clear. If you have an incision (the cut made at the time of surgery), support your incision when coughing by placing a pillow or rolled up towels firmly against it. Once you are able to get out of  bed, walk around indoors and cough well. You may stop using the incentive spirometer when instructed by your caregiver.  RISKS AND COMPLICATIONS Take your time so you do not get dizzy or light-headed. If you are in pain, you may need to take or ask for pain medication before doing incentive spirometry. It is harder to take a deep breath if you are having pain. AFTER USE Rest and breathe slowly and easily. It can be helpful to keep track of a log of your progress. Your caregiver can provide you with a simple table to help with this. If you are using the spirometer at home, follow these instructions: [place] IF:  You are having difficultly using the spirometer. You have trouble using the spirometer as often as instructed. Your pain medication is not giving enough relief while using the spirometer. You develop fever of 100.5 F (38.1 C) or higher. SEEK IMMEDIATE MEDICAL CARE IF:  You cough up bloody sputum that had not been present before. You develop fever of 102 F (38.9 C) or greater. You develop worsening pain at or near the incision site. MAKE SURE YOU:  Understand these instructions. Will watch your condition. Will get help right away if you are not doing well or get worse. Document Released: 11/14/2006 Document Revised: 09/26/2011 Document Reviewed: 01/15/2007 [facility] Patient Information 2014 [place], [place].   ________________________________________________________________________

## 2022-01-12 NOTE — Telephone Encounter (Signed)
Faxed order to [facility]

## 2022-01-14 ENCOUNTER — Encounter (HOSPITAL_COMMUNITY): Payer: Self-pay

## 2022-01-14 ENCOUNTER — Encounter (HOSPITAL_COMMUNITY)
Admission: RE | Admit: 2022-01-14 | Discharge: 2022-01-14 | Disposition: A | Discharge status: Home / Self Care | Payer: Medicare HMO | Source: Ambulatory Visit | Attending: Orthopaedic Surgery | Admitting: Orthopaedic Surgery

## 2022-01-14 ENCOUNTER — Other Ambulatory Visit: Payer: Self-pay

## 2022-01-14 VITALS — BP 142/109 | HR 57 | Temp 97.7°F | Ht 66.0 in | Wt 245.0 lb

## 2022-01-14 DIAGNOSIS — I2510 Atherosclerotic heart disease of native coronary artery without angina pectoris: Secondary | ICD-10-CM | POA: Insufficient documentation

## 2022-01-14 DIAGNOSIS — Z01818 Encounter for other preprocedural examination: Secondary | ICD-10-CM

## 2022-01-14 DIAGNOSIS — Z01812 Encounter for preprocedural laboratory examination: Secondary | ICD-10-CM | POA: Insufficient documentation

## 2022-01-14 DIAGNOSIS — E119 Type 2 diabetes mellitus without complications: Secondary | ICD-10-CM | POA: Diagnosis not present

## 2022-01-14 DIAGNOSIS — M1611 Unilateral primary osteoarthritis, right hip: Secondary | ICD-10-CM | POA: Insufficient documentation

## 2022-01-14 HISTORY — DX: Type 2 diabetes mellitus without complications: E11.9

## 2022-01-14 HISTORY — DX: Cardiac arrhythmia, unspecified: I49.9

## 2022-01-14 HISTORY — DX: Essential (primary) hypertension: I10

## 2022-01-14 LAB — SURGICAL PCR SCREEN
MRSA, PCR: NEGATIVE
Staphylococcus aureus: NEGATIVE

## 2022-01-14 LAB — BASIC METABOLIC PANEL
Anion gap: 12 (ref 5–15)
BUN: 22 mg/dL (ref 8–23)
CO2: 24 mmol/L (ref 22–32)
Calcium: 8.7 mg/dL — ABNORMAL LOW (ref 8.9–10.3)
Chloride: 100 mmol/L (ref 98–111)
Creatinine, Ser: 1.35 mg/dL — ABNORMAL HIGH (ref 0.61–1.24)
GFR, Estimated: 53 mL/min — ABNORMAL LOW (ref >60)
Glucose, Bld: 169 mg/dL — ABNORMAL HIGH (ref 70–99)
Potassium: 4.5 mmol/L (ref 3.5–5.1)
Sodium: 136 mmol/L (ref 135–145)

## 2022-01-14 LAB — CBC
HCT: 43.2 % (ref 39.0–52.0)
Hemoglobin: 14.5 g/dL (ref 13.0–17.0)
MCH: 31.8 pg (ref 26.0–34.0)
MCHC: 33.6 g/dL (ref 30.0–36.0)
MCV: 94.7 fL (ref 80.0–100.0)
Platelets: 282 10*3/uL (ref 150–400)
RBC: 4.56 MIL/uL (ref 4.22–5.81)
RDW: 13.8 % (ref 11.5–15.5)
WBC: 9.9 10*3/uL (ref 4.0–10.5)
nRBC: 0 % (ref 0.0–0.2)

## 2022-01-14 LAB — GLUCOSE, CAPILLARY: Glucose-Capillary: 208 mg/dL — ABNORMAL HIGH (ref 70–99)

## 2022-01-14 NOTE — Progress Notes (Addendum)
For Short Stay: [place] appointment date: Date of COVID positive in last 40 days:  Bowel Prep reminder:   For Anesthesia: PCP - [facility] Cardiologist - Dr. Claudie Leach  Chest x-ray -  EKG - requested Stress Test -  ECHO -  Cardiac Cath -  Pacemaker/ICD device last checked: Pacemaker orders received: Device Rep notified:  Spinal Cord Stimulator:  Sleep Study - Yes CPAP - Yes  Fasting Blood Sugar - 150 - 200 Checks Blood Sugar ___2__ times a week Date and result of last Hgb A1c-  Blood Thinner Instructions: Eliquis will be hold 3 days before surgery>: Dr. Ninfa Linden. Aspirin Instructions: Last Dose:  Activity level: Can go up a flight of stairs and activities of daily living without stopping and without chest pain and/or shortness of breath   Able to exercise without chest pain and/or shortness of breath   Unable to go up a flight of stairs without chest pain and/or shortness of breath     Anesthesia review: Hx: HTN,DIA,OSA(CPAP),Afib  Patient denies shortness of breath, fever, cough and chest pain at PAT appointment   Patient verbalized understanding of instructions that were given to them at the PAT appointment. Patient was also instructed that they will need to review over the PAT instructions again at home before surgery.

## 2022-01-15 LAB — HEMOGLOBIN A1C
Hgb A1c MFr Bld: 9 % — ABNORMAL HIGH (ref 4.8–5.6)
Mean Plasma Glucose: 212 mg/dL

## 2022-01-19 ENCOUNTER — Encounter (HOSPITAL_COMMUNITY): Payer: Self-pay | Admitting: Physician Assistant

## 2022-01-19 NOTE — Progress Notes (Signed)
Anesthesia Chart Review   Case: [id number] Date/Time: 01/20/22 0917   Procedure: RIGHT TOTAL HIP ARTHROPLASTY ANTERIOR APPROACH (Right: Hip)   Anesthesia type: Spinal   Pre-op diagnosis: osteoarthritis right hip   Location: [facility] ROOM 07 / [facility]   Surgeons: [name], MD       DISCUSSION:81 y.o. never smoker with h/o HTN, DM II, sleep apnea w/CPAP, prostate cancer, a-fib (on Eliquis), right hip OA scheduled for above procedure 01/20/2022 with Dr. Jean Rosenthal.   Advised to hold Eliquis 3 days prior to surgery.   A1C 9.0, discussed with Dr. Trevor Mace office.  VS: BP (!) 142/109   Pulse (!) 57   Temp 36.5 C (Oral)   Ht '5\' 6"'$  (1.676 m)   Wt 111.1 kg   SpO2 95%   BMI 39.54 kg/m   PROVIDERS: [facility], [place]:  forwarded to surgeon (all labs ordered are listed, but only abnormal results are displayed)  Labs Reviewed  HEMOGLOBIN A1C - Abnormal; Notable for the following components:      Result Value   Hgb A1c MFr Bld 9.0 (*)    All other components within normal limits  BASIC METABOLIC PANEL - Abnormal; Notable for the following components:   Glucose, Bld 169 (*)    Creatinine, Ser 1.35 (*)    Calcium 8.7 (*)    GFR, Estimated 53 (*)    All other components within normal limits  GLUCOSE, CAPILLARY - Abnormal; Notable for the following components:   Glucose-Capillary 208 (*)    All other components within normal limits  SURGICAL PCR SCREEN  CBC  TYPE AND SCREEN     IMAGES:   EKG:   CV:  Past Medical History:  Diagnosis Date   Arthritis    Complication of anesthesia    States "I woke up to soon one time" with rotator cuff repair   Diabetes mellitus without complication (HCC)    Dysrhythmia    GERD (gastroesophageal reflux disease)    Headache    History of hiatal hernia    Hypertension    Lower extremity edema    occasionally takes furosemide prn   Prostate cancer ([place]) 2007   tx with surgery   RLS (restless legs  syndrome)    Rotator cuff tear    right   Sleep apnea    uses CPAP nightly    Past Surgical History:  Procedure Laterality Date   FINGER ARTHROPLASTY Left 03/10/2017   Procedure: Left index finger metacarpal phalangeal arthroplasty;  Surgeon: Roseanne Kaufman, MD;  Location: [facility];  Service: Orthopedics;  Laterality: Left;  90 mins   JOINT REPLACEMENT     KNEE SURGERY Bilateral    left knee replacment  2009   MANDIBLE FRACTURE SURGERY     left 2 titanium plates and 10 scres   NECK SURGERY     cervical with bone graft   PROSTATE SURGERY     prostatctomy   REVERSE SHOULDER ARTHROPLASTY Left 02/14/2020   Procedure: REVERSE SHOULDER ARTHROPLASTY;  Surgeon: Netta Cedars, MD;  [facility];  Service: Orthopedics;  Laterality: Left;  Interscalene block   ROTATOR CUFF REPAIR     x 2 right x 2 left   TOTAL HIP ARTHROPLASTY Left 09/13/2016   Procedure: LEFT TOTAL HIP ARTHROPLASTY ANTERIOR APPROACH;  Surgeon: [name], MD;  Location: [facility] [facility];  Service: Orthopedics;  Laterality: Left;   TOTAL KNEE ARTHROPLASTY Right 03/23/2018   Procedure: RIGHT TOTAL KNEE  ARTHROPLASTY;  Surgeon: [name], MD;  [facility];  Service: Orthopedics;  Laterality: Right;    MEDICATIONS:  apixaban (ELIQUIS) 5 MG TABS tablet   atorvastatin (LIPITOR) 10 MG tablet   baclofen (LIORESAL) 10 MG tablet   furosemide (LASIX) 20 MG tablet   gabapentin (NEURONTIN) 100 MG capsule   HYDROcodone-acetaminophen (NORCO) 10-325 MG tablet   lisinopril (ZESTRIL) 2.5 MG tablet   metFORMIN (GLUCOPHAGE) 1000 MG tablet   methocarbamol (ROBAXIN) 500 MG tablet   pantoprazole (PROTONIX) 40 MG tablet   phenytoin (DILANTIN) 100 MG ER capsule   rOPINIRole (REQUIP) 1 MG tablet   rOPINIRole (REQUIP) 3 MG tablet   zolpidem (AMBIEN) 5 MG tablet   No current facility-administered medications for this encounter.     Manuel Felix Ward, PA-C [facility]Surgical [facility] [phone]

## 2022-01-20 ENCOUNTER — Ambulatory Visit (HOSPITAL_COMMUNITY): Admission: RE | Admit: 2022-01-20 | Payer: Medicare HMO | Source: Ambulatory Visit | Admitting: Orthopaedic Surgery

## 2022-01-20 ENCOUNTER — Encounter (HOSPITAL_COMMUNITY): Admission: RE | Payer: Self-pay | Source: Ambulatory Visit

## 2022-01-20 LAB — TYPE AND SCREEN
ABO/RH(D): A POS
Antibody Screen: NEGATIVE

## 2022-01-20 SURGERY — ARTHROPLASTY, HIP, TOTAL, ANTERIOR APPROACH
Anesthesia: Spinal | Site: Hip | Laterality: Right

## 2022-01-27 ENCOUNTER — Other Ambulatory Visit: Payer: Self-pay | Admitting: Orthopaedic Surgery

## 2022-01-27 ENCOUNTER — Telehealth: Payer: Self-pay

## 2022-01-27 MED ORDER — HYDROCODONE-ACETAMINOPHEN 10-325 MG PO TABS: 1.0000 | ORAL_TABLET | Freq: Four times a day (QID) | ORAL | 0 refills | Status: DC | PRN

## 2022-01-27 NOTE — Telephone Encounter (Signed)
Patient left voice mail requesting refill of Hydrocodone.

## 2022-02-03 ENCOUNTER — Encounter: Payer: Medicare HMO | Admitting: Orthopaedic Surgery

## 2022-02-07 ENCOUNTER — Telehealth: Payer: Self-pay

## 2022-02-07 NOTE — Telephone Encounter (Signed)
Patient left voice mail requesting refill of Gabapentin sent to [facility] in [facility] : [facility].

## 2022-02-08 ENCOUNTER — Other Ambulatory Visit: Payer: Self-pay | Admitting: Orthopaedic Surgery

## 2022-02-08 MED ORDER — GABAPENTIN 100 MG PO CAPS
200.0000 mg | ORAL_CAPSULE | Freq: Every day | ORAL | 1 refills | Status: DC
Start: 2022-02-08 — End: 2022-12-13

## 2022-02-10 ENCOUNTER — Encounter: Payer: Medicare HMO | Admitting: Orthopaedic Surgery

## 2022-02-25 ENCOUNTER — Other Ambulatory Visit: Payer: Self-pay

## 2022-03-10 NOTE — Progress Notes (Addendum)
COVID Vaccine received:  '[]'$  No '[x]'$  Yes Date of any COVID positive Test in last 90 days:  none  PCP - [facility] Cardiologist - Dr Claudie Leach  Chest x-ray - 03-09-2020  [facility] EKG -  08-26-2019  Epic Stress Test - n/a ECHO - n/a Cardiac Cath - n/a  Pacemaker/ICD device     '[x]'$  N/A Spinal Cord Stimulator:'[x]'$  No '[]'$  Yes   Other Implants: Penile implant  History of Sleep Apnea? '[]'$  No '[x]'$  Yes   Sleep Study Date:   CPAP used?- '[]'$  No '[x]'$  Yes  (Instruct to bring their mask & Tubing)  Does the patient monitor blood sugar? '[]'$  No '[x]'$  Yes  '[]'$  N/A Does patient have a [employer] or Dexacom? '[]'$  No '[]'$  Yes   Fasting Blood Sugar Ranges- 112-150 Checks Blood Sugar _3_ times a day   ELIQUIS- HOLD x 3 DAYS  Aspirin Instructions: none Last Dose:  ERAS Protocol Ordered: '[]'$  No  '[x]'$  Yes PRE-SURGERY '[]'$  ENSURE  '[x]'$  G2   Comments:   Activity level: Patient can not climb a flight of stairs without difficulty;  '[x]'$  No CP   but would have __SOB,  back / leg pain____   Anesthesia review: OSA (CPAP), A. Fib, Hx ACDF. Per patient , Problems with waking up too soon in previous surgery (rotator cuff repair)  Patient denies shortness of breath, fever, cough and chest pain at PAT appointment.  Patient verbalized understanding and agreement to the Pre-Surgical Instructions that were given to them at this PAT appointment. Patient was also educated of the need to review these PAT instructions again prior to his/her surgery.I reviewed the appropriate phone numbers to call if they have any and questions or concerns.

## 2022-03-11 ENCOUNTER — Ambulatory Visit (INDEPENDENT_AMBULATORY_CARE_PROVIDER_SITE_OTHER): Payer: Medicare HMO

## 2022-03-11 ENCOUNTER — Encounter: Payer: Self-pay | Admitting: Sports Medicine

## 2022-03-11 ENCOUNTER — Ambulatory Visit: Payer: Medicare HMO | Admitting: Sports Medicine

## 2022-03-11 DIAGNOSIS — M5489 Other dorsalgia: Secondary | ICD-10-CM | POA: Diagnosis not present

## 2022-03-11 DIAGNOSIS — M549 Dorsalgia, unspecified: Secondary | ICD-10-CM

## 2022-03-11 DIAGNOSIS — M25512 Pain in left shoulder: Secondary | ICD-10-CM | POA: Diagnosis not present

## 2022-03-11 NOTE — Progress Notes (Signed)
Office Visit Note   Patient: Manuel CUNDARI Sr.           Date of Birth: May 01, 1941           MRN: [id number] Visit Date: 03/11/2022              Requested by: [facility], [address] [place],  [place] [address] PCP: [place]: Visit Diagnoses:  1. Mid back pain   2. Trigger point of shoulder region, left    Plan: Discussed with Manuel Hunter and his wife the likely etiology of his pain, which seems most indicative of a muscular strain with associated spasm.  We did discuss other possible etiologies of left shoulder pain such as radicular pain from the gallbladder and/or aorta, however he has no abdominal pain or chest pain or shortness of breath.  Did provide strict precautions to ED if any of these were to present.  He was looking for immediate pain relief, so I did perform 2 trigger point injections which he tolerated well.  IM Toradol 30 mg per mL was also provided into the gluteus region.  Recommended heat and as pain improves within the next few days I did give him some scapular retraction exercises to perform.  He is to call or return if his pain does not improve over the next few days.  Otherwise, continue follow-up for planned hip replacement with Dr. Ninfa Linden.  Follow-Up Instructions: Follow-up as needed   Orders:  Orders Placed This Encounter  Procedures   XR Thoracic Spine 2 View   No orders of the defined types were placed in this encounter.    Procedures:  Procedure: Trigger point injection (2), musculature of left medial scapular border After discussion on R/B/I and informed verbal consent was obtained, a timeout was conducted. The patient was placed in a seated position on the examination table and the areas of maximal tenderness was identified over the medial aspect of the knee scapular border.  This area was cleansed with Betadine and multiple alcohol swabs. Ethyl chloride was used for local anesthesia. Using a 25-gauge 1.5  inch needle the trigger point(s) was subsequently injected with a mixture of 1 cc of methylprednisolone 40 mg/mL and 2 cc of 1% lidocaine without epinephrine, with a total of 1.5cc of injectate into each trigger point. A band-aid was applied following. Patient tolerated procedure well, there were no post-injection complications. Post-procedure instructions were given.   Clinical Data: No additional findings.   Subjective: CC: right mid-back and left shoulder pain  HPI Manuel Hunter is a very pleasant and charismatic 81 year old male who presents today for onset of left mid back and shoulder pain since yesterday.  He states yesterday he was reaching down to pick something up off the floor when he felt a twinge in the mid back.  As the day went on this pain became much worse it feels like a sharp aching and sometimes stabbing pain.  He denies any radicular pain.  He denies any chest pain, shortness of breath or abdominal pain.  Denies any wrapping pain from around the chest.  Pain is localized mostly to the inner border of the medial scapula.  Worse with certain reaching motions.  He is planning on having his hip replaced in 1 week and would like this to be improved by that time.  Review of Systems -No chest pain, shortness of breath -No abdominal pain, nausea; no dizziness  Objective: Vital Signs: HR: 83;  RR: 14  Physical Exam Gen: Well-appearing, in no acute distress; non-toxic CV: Regular Rate. Well-perfused. Warm.  Resp: Breathing unlabored on room air; no wheezing. Psych: Fluid speech in conversation; appropriate affect; normal thought process Neuro: Sensation intact throughout. No gross coordination deficits.   Ortho Exam  - Mid back and left shoulder: No midline spinous process TTP nor TTP of the left shoulder.  There is tenderness to palpation within the mid level of the medial scapular border musculature.  Pain is slightly radiate with palpation and hypertonicity noted.  No specific  scapular dyskinesis.  Has equivocal range of motion of bilateral shoulders, although some positions do cause pain in that area.  No overlying redness or swelling.  Specialty Comments:  No specialty comments available.  Imaging: XR Thoracic Spine 2 View  Result Date: 03/11/2022 3 views of the thoracic spine including AP, lateral and swimmer's views were ordered and reviewed by myself.  X-rays demonstrate no significant scoliosis.  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  Evidence of right shoulder arthroplasty as well as surgical hardware from likely prior C5-C7 ACDF.    PMFS History: Patient Active Problem List   Diagnosis Date Noted   Unilateral primary osteoarthritis, right hip 12/27/2021   H/O total shoulder replacement, left 02/14/2020   Unilateral primary osteoarthritis, right knee 03/23/2018   Status post total knee replacement, right 03/23/2018   Unilateral primary osteoarthritis, left hip 09/13/2016   Status post left hip replacement 09/13/2016   Past Medical History:  Diagnosis Date   Arthritis    Complication of anesthesia    States "I woke up to soon one time" with rotator cuff repair   Diabetes mellitus without complication ([place])    Dysrhythmia    GERD (gastroesophageal reflux disease)    Headache    History of hiatal hernia    Hypertension    Lower extremity edema    occasionally takes furosemide prn   Prostate cancer ([place]) 2007   tx with surgery   RLS (restless legs syndrome)    Rotator cuff tear    right   Sleep apnea    uses CPAP nightly    History reviewed. No pertinent family history.  Past Surgical History:  Procedure Laterality Date   FINGER ARTHROPLASTY Left 03/10/2017   Procedure: Left index finger metacarpal phalangeal arthroplasty;  Surgeon: Roseanne Kaufman, MD;  Location: [facility];  Service: Orthopedics;  Laterality: Left;  90 mins   JOINT REPLACEMENT     KNEE  SURGERY Bilateral    left knee replacment  2009   MANDIBLE FRACTURE SURGERY     left 2 titanium plates and 10 scres   NECK SURGERY     cervical with bone graft   PROSTATE SURGERY     prostatctomy   REVERSE SHOULDER ARTHROPLASTY Left 02/14/2020   Procedure: REVERSE SHOULDER ARTHROPLASTY;  Surgeon: Netta Cedars, MD;  Location: [facility];  Service: Orthopedics;  Laterality: Left;  Interscalene block   ROTATOR CUFF REPAIR     x 2 right x 2 left   TOTAL HIP ARTHROPLASTY Left 09/13/2016   Procedure: LEFT TOTAL HIP ARTHROPLASTY ANTERIOR APPROACH;  Surgeon: [name], MD;  Location: [facility];  Service: Orthopedics;  Laterality: Left;   TOTAL KNEE ARTHROPLASTY Right 03/23/2018   Procedure: RIGHT TOTAL KNEE ARTHROPLASTY;  Surgeon: [name], MD;  Location: [facility];  Service: Orthopedics;  Laterality: Right;   Social History  Occupational History   Not on file  Tobacco Use   Smoking status: Never   Smokeless tobacco: Never  Vaping Use   Vaping Use: Never used  Substance and Sexual Activity   Alcohol use: Never   Drug use: Never   Sexual activity: Not on file

## 2022-03-14 ENCOUNTER — Other Ambulatory Visit: Payer: Self-pay | Admitting: Physician Assistant

## 2022-03-14 ENCOUNTER — Encounter (HOSPITAL_COMMUNITY): Payer: Self-pay

## 2022-03-14 ENCOUNTER — Other Ambulatory Visit: Payer: Self-pay

## 2022-03-14 ENCOUNTER — Emergency Department (HOSPITAL_COMMUNITY)
Admission: EM | Admit: 2022-03-14 | Discharge: 2022-03-14 | Disposition: A | Discharge status: Home / Self Care | Payer: Medicare HMO | Attending: Emergency Medicine | Admitting: Emergency Medicine

## 2022-03-14 ENCOUNTER — Emergency Department (HOSPITAL_COMMUNITY): Payer: Medicare HMO

## 2022-03-14 ENCOUNTER — Encounter (HOSPITAL_COMMUNITY)
Admission: RE | Admit: 2022-03-14 | Discharge: 2022-03-14 | Disposition: A | Discharge status: Home / Self Care | Payer: Medicare HMO | Source: Ambulatory Visit | Attending: Orthopaedic Surgery | Admitting: Orthopaedic Surgery

## 2022-03-14 ENCOUNTER — Telehealth: Payer: Self-pay | Admitting: Sports Medicine

## 2022-03-14 ENCOUNTER — Telehealth: Payer: Self-pay

## 2022-03-14 VITALS — BP 123/86 | HR 110 | Temp 98.1°F | Resp 22 | Ht 66.0 in | Wt 237.0 lb

## 2022-03-14 DIAGNOSIS — I4891 Unspecified atrial fibrillation: Secondary | ICD-10-CM | POA: Diagnosis not present

## 2022-03-14 DIAGNOSIS — Z01818 Encounter for other preprocedural examination: Secondary | ICD-10-CM

## 2022-03-14 DIAGNOSIS — E119 Type 2 diabetes mellitus without complications: Secondary | ICD-10-CM | POA: Insufficient documentation

## 2022-03-14 DIAGNOSIS — I10 Essential (primary) hypertension: Secondary | ICD-10-CM | POA: Insufficient documentation

## 2022-03-14 DIAGNOSIS — Z7901 Long term (current) use of anticoagulants: Secondary | ICD-10-CM | POA: Insufficient documentation

## 2022-03-14 DIAGNOSIS — M1611 Unilateral primary osteoarthritis, right hip: Secondary | ICD-10-CM | POA: Insufficient documentation

## 2022-03-14 HISTORY — DX: Unspecified atrial fibrillation: I48.91

## 2022-03-14 LAB — BASIC METABOLIC PANEL
Anion gap: 10 (ref 5–15)
Anion gap: 9 (ref 5–15)
BUN: 16 mg/dL (ref 8–23)
BUN: 16 mg/dL (ref 8–23)
CO2: 24 mmol/L (ref 22–32)
CO2: 25 mmol/L (ref 22–32)
Calcium: 8.9 mg/dL (ref 8.9–10.3)
Calcium: 9.2 mg/dL (ref 8.9–10.3)
Chloride: 103 mmol/L (ref 98–111)
Chloride: 103 mmol/L (ref 98–111)
Creatinine, Ser: 1.01 mg/dL (ref 0.61–1.24)
Creatinine, Ser: 1.01 mg/dL (ref 0.61–1.24)
GFR, Estimated: >60 mL/min (ref >60)
GFR, Estimated: >60 mL/min (ref >60)
Glucose, Bld: 124 mg/dL — ABNORMAL HIGH (ref 70–99)
Glucose, Bld: 217 mg/dL — ABNORMAL HIGH (ref 70–99)
Potassium: 4.2 mmol/L (ref 3.5–5.1)
Potassium: 4.7 mmol/L (ref 3.5–5.1)
Sodium: 137 mmol/L (ref 135–145)
Sodium: 137 mmol/L (ref 135–145)

## 2022-03-14 LAB — CBC
HCT: 38.4 % — ABNORMAL LOW (ref 39.0–52.0)
Hemoglobin: 12.7 g/dL — ABNORMAL LOW (ref 13.0–17.0)
MCH: 32.4 pg (ref 26.0–34.0)
MCHC: 33.1 g/dL (ref 30.0–36.0)
MCV: 98 fL (ref 80.0–100.0)
Platelets: 263 10*3/uL (ref 150–400)
RBC: 3.92 MIL/uL — ABNORMAL LOW (ref 4.22–5.81)
RDW: 13.8 % (ref 11.5–15.5)
WBC: 10 10*3/uL (ref 4.0–10.5)
nRBC: 0 % (ref 0.0–0.2)

## 2022-03-14 LAB — SURGICAL PCR SCREEN
MRSA, PCR: NEGATIVE
Staphylococcus aureus: NEGATIVE

## 2022-03-14 LAB — CBC WITH DIFFERENTIAL/PLATELET
Abs Immature Granulocytes: 0.07 10*3/uL (ref 0.00–0.07)
Basophils Absolute: 0 10*3/uL (ref 0.0–0.1)
Basophils Relative: 0 %
Eosinophils Absolute: 0.2 10*3/uL (ref 0.0–0.5)
Eosinophils Relative: 2 %
HCT: 41.3 % (ref 39.0–52.0)
Hemoglobin: 13.9 g/dL (ref 13.0–17.0)
Immature Granulocytes: 1 %
Lymphocytes Relative: 13 %
Lymphs Abs: 1.4 10*3/uL (ref 0.7–4.0)
MCH: 32.9 pg (ref 26.0–34.0)
MCHC: 33.7 g/dL (ref 30.0–36.0)
MCV: 97.6 fL (ref 80.0–100.0)
Monocytes Absolute: 1 10*3/uL (ref 0.1–1.0)
Monocytes Relative: 10 %
Neutro Abs: 8 10*3/uL — ABNORMAL HIGH (ref 1.7–7.7)
Neutrophils Relative %: 74 %
Platelets: 278 10*3/uL (ref 150–400)
RBC: 4.23 MIL/uL (ref 4.22–5.81)
RDW: 13.8 % (ref 11.5–15.5)
WBC: 10.8 10*3/uL — ABNORMAL HIGH (ref 4.0–10.5)
nRBC: 0 % (ref 0.0–0.2)

## 2022-03-14 LAB — TSH: TSH: 4.468 u[IU]/mL (ref 0.350–4.500)

## 2022-03-14 LAB — TROPONIN I (HIGH SENSITIVITY): Troponin I (High Sensitivity): 12 ng/L (ref <18)

## 2022-03-14 LAB — GLUCOSE, CAPILLARY: Glucose-Capillary: 124 mg/dL — ABNORMAL HIGH (ref 70–99)

## 2022-03-14 MED ORDER — DILTIAZEM HCL 25 MG/5ML IV SOLN
20.0000 mg | Freq: Once | INTRAVENOUS | Status: AC
  Administered 2022-03-14: 20 mg via INTRAVENOUS
  Filled 2022-03-14: qty 5

## 2022-03-14 MED ORDER — DILTIAZEM HCL ER COATED BEADS 120 MG PO CP24: 120.0000 mg | ORAL_CAPSULE | Freq: Every day | ORAL | 0 refills | Status: AC

## 2022-03-14 MED ORDER — DILTIAZEM HCL ER COATED BEADS 120 MG PO CP24
120.0000 mg | ORAL_CAPSULE | Freq: Once | ORAL | Status: AC
  Administered 2022-03-14: 120 mg via ORAL
  Filled 2022-03-14: qty 1

## 2022-03-14 MED ORDER — DILTIAZEM HCL 25 MG/5ML IV SOLN
10.0000 mg | Freq: Once | INTRAVENOUS | Status: AC
  Administered 2022-03-14: 10 mg via INTRAVENOUS
  Filled 2022-03-14: qty 5

## 2022-03-14 NOTE — Discharge Instructions (Addendum)
Follow-up with your cardiologist this week.  Call them tomorrow if they do not get in touch with you for an appointment this week.  They can clear you for surgery if you orthopedic still wants to do surgery

## 2022-03-14 NOTE — Progress Notes (Signed)
Mr. Snellings' EKG reading at PST appt, showed A.fib w/ RVR, Incomplete RBBB. Jessica Ward PA examined the patient and recommended that he be seen in the [facility] ED for IV rate control medications. I took the patient and his wife to the ED and checked him in with the front desk.

## 2022-03-14 NOTE — ED Provider Notes (Signed)
[facility] DEPT Provider Note   CSN: [id number] Arrival date & time: 03/14/22  1532     History {Add pertinent medical, surgical, social history, OB history to HPI:1} Chief Complaint  Patient presents with   Atrial Fibrillation    Manuel Grippe Sr. is a 81 y.o. male.  Patient presents with rapid atrial fibs.   Atrial Fibrillation       Home Medications Prior to Admission medications   Medication Sig Start Date End Date Taking? Authorizing Provider  acetaminophen (TYLENOL) 500 MG tablet Take 1,000 mg by mouth every 8 (eight) hours as needed for moderate pain.   Yes [provider]  apixaban (ELIQUIS) 2.5 MG TABS tablet Take 2.5 mg by mouth 2 (two) times daily. 11/29/21  Yes [provider]  atorvastatin (LIPITOR) 10 MG tablet Take 10 mg by mouth at bedtime. 12/04/19  Yes [provider]  diltiazem (CARDIZEM CD) 120 MG 24 hr capsule Take 1 capsule (120 mg total) by mouth daily. 03/14/22  Yes Milton Ferguson, MD  furosemide (LASIX) 20 MG tablet Take 20 mg by mouth daily as needed for edema.   Yes [provider]  gabapentin (NEURONTIN) 100 MG capsule Take 2 capsules (200 mg total) by mouth at bedtime. 02/08/22  Yes [name], MD  HYDROcodone-acetaminophen [facility]) 10-325 MG tablet Take 1 tablet by mouth every 6 (six) hours as needed for moderate pain or severe pain. 01/27/22  Yes [name], MD  lisinopril (ZESTRIL) 2.5 MG tablet Take 2.5 mg by mouth daily. 12/14/21  Yes [provider]  OZEMPIC, 0.25 OR 0.5 MG/DOSE, 2 MG/3ML SOPN Inject 0.25 mg into the skin every Thursday. 01/26/22  Yes [provider]  pantoprazole (PROTONIX) 40 MG tablet Take 40 mg by mouth every morning.  09/10/14  Yes [provider]  phenytoin (DILANTIN) 100 MG ER capsule Take 200 mg by mouth at bedtime. 09/01/15  Yes [provider]  rOPINIRole (REQUIP) 1 MG tablet Take 1 mg by mouth 3  (three) times daily with meals.    Yes [provider]  rOPINIRole (REQUIP) 3 MG tablet Take 3 mg by mouth at bedtime.   Yes [provider]  SitaGLIPtin-MetFORMIN HCl (JANUMET XR) 50-1000 MG TB24 Take 1 tablet by mouth at bedtime.   Yes [provider]  Vitamin D, Ergocalciferol, (DRISDOL) 1.25 MG (50000 UNIT) CAPS capsule Take 50,000 Units by mouth every Monday. 02/17/22  Yes [provider]  baclofen (LIORESAL) 10 MG tablet Take 1 tablet (10 mg total) by mouth 3 (three) times daily as needed for muscle spasms. Patient not taking: Reported on 03/14/2022 04/29/21   [name], MD  methocarbamol (ROBAXIN) 500 MG tablet Take 1 tablet (500 mg total) by mouth every 6 (six) hours as needed for muscle spasms. Patient not taking: Reported on 03/14/2022 06/06/18   [name], MD      Allergies    Morphine    Review of Systems   Review of Systems  Physical Exam Updated Vital Signs BP 104/75   Pulse 85   Temp 97.6 F (36.4 C) (Oral)   Resp (!) 23   SpO2 99%  Physical Exam  ED Results / Procedures / Treatments   Labs (all labs ordered are listed, but only abnormal results are displayed) Labs Reviewed  CBC WITH DIFFERENTIAL/PLATELET - Abnormal; Notable for the following components:      Result Value   WBC 10.8 (*)    Neutro  Abs 8.0 (*)    All other components within normal limits  BASIC METABOLIC PANEL - Abnormal; Notable for the following components:   Glucose, Bld 217 (*)    All other components within normal limits  TSH  TROPONIN I (HIGH SENSITIVITY)  TROPONIN I (HIGH SENSITIVITY)    EKG None  Radiology DG Chest Port 1 View  Result Date: 03/14/2022 CLINICAL DATA:  AFib, elevated heart rate. EXAM: PORTABLE CHEST 1 VIEW COMPARISON:  08/26/2019 FINDINGS: The heart size and mediastinal contours are stable. Lung volumes are low with atelectasis at the lung bases. No consolidation, effusion, or pneumothorax. No acute osseous  abnormality. IMPRESSION: Low lung volumes with atelectasis or infiltrate at the lung bases. Electronically Signed   By: Brett Fairy M.D.   On: 03/14/2022 21:56    Procedures Procedures  {Document cardiac monitor, telemetry assessment procedure when appropriate:1}  Medications Ordered in ED Medications  diltiazem (CARDIZEM) injection 20 mg (20 mg Intravenous Given 03/14/22 2139)  diltiazem (CARDIZEM) injection 10 mg (10 mg Intravenous Given 03/14/22 2252)  diltiazem (CARDIZEM CD) 24 hr capsule 120 mg (120 mg Oral Given 03/14/22 2256)    ED Course/ Medical Decision Making/ A&P                           Medical Decision Making Amount and/or Complexity of Data Reviewed Radiology: ordered.  Risk Prescription drug management.   Patient with rapid atrial fibs.  He is sent home with cardiac exam will follow-up with his cardiologist {Document critical care time when appropriate:1} {Document review of labs and clinical decision tools ie heart score, Chads2Vasc2 etc:1}  {Document your independent review of radiology images, and any outside records:1} {Document your discussion with family members, caretakers, and with consultants:1} {Document social determinants of health affecting pt's care:1} {Document your decision making why or why not admission, treatments were needed:1} Final Clinical Impression(s) / ED Diagnoses Final diagnoses:  Atrial fibrillation with RVR ([place])    Rx / DC Orders ED Discharge Orders          Ordered    diltiazem (CARDIZEM CD) 120 MG 24 hr capsule  Daily        03/14/22 2312

## 2022-03-14 NOTE — Patient Instructions (Addendum)
DUE TO SPACE LIMITATIONS, ONLY TWO VISITORS  (aged 81 and older) ARE ALLOWED TO COME WITH YOU AND STAY IN THE WAITING ROOM DURING YOUR PRE OP AND PROCEDURE.   **NO VISITORS ARE ALLOWED IN THE SHORT STAY AREA OR RECOVERY ROOM!!**  IF YOU WILL BE ADMITTED INTO THE HOSPITAL YOU ARE ALLOWED ONLY FOUR SUPPORT PEOPLE DURING VISITATION HOURS (7 AM -8PM)   The support person(s) must pass our screening, and use Hand sanitizing gel. Visitors GUEST BADGE MUST BE WORN VISIBLY  One adult visitor may remain with you overnight and MUST be in the room by 8 P.M.   You are not required to quarantine at this time prior to your surgery. However, you must do this: Hand Hygiene often Do NOT share personal items Notify your provider if you are in close contact with someone who has COVID or you develop fever 100.4 or greater, new onset of sneezing, cough, sore throat, shortness of breath or body aches.       Your procedure is scheduled on:  Friday March 18, 2022  Report to [facility] Main Main Entrance.  Report to admitting at: 07:15     AM  +++++Call this number if you have any questions or problems the morning of surgery [phone]  Do not eat food :After Midnight the night prior to your surgery/procedure.  After Midnight you may have the following liquids until     06:45    AM DAY OF SURGERY  Clear Liquid Diet Water Black Coffee (sugar ok, NO MILK/CREAM OR CREAMERS)  Tea (sugar ok, NO MILK/CREAM OR CREAMERS) regular and decaf                             Plain Jell-O (NO RED)                                           Fruit ices (not with fruit pulp, NO RED)                                     Popsicles (NO RED)                                                                  Juice: apple, WHITE grape, WHITE cranberry Sports drinks like Gatorade (NO RED)                    The day of surgery:  Drink ONE (1) Pre-Surgery G2 at  06:45      AM the morning of surgery. Drink in one sitting. Do  not sip.  This drink was given to you during your hospital pre-op appointment visit. Nothing else to drink after completing the Pre-Surgery G2.    FOLLOW ANY ADDITIONAL PRE OP INSTRUCTIONS YOU RECEIVED FROM YOUR SURGEON'S OFFICE!!!   Oral Hygiene is also important to reduce your risk of infection.        Remember - BRUSH YOUR TEETH THE MORNING OF SURGERY WITH YOUR REGULAR TOOTHPASTE  Do NOT smoke after  Midnight the night before surgery.  Hold your ELIQUIS for 3 days prior to your surgery  Ozempic- don't take on Thursday prior to surgery. Janumet- take as usual the night before surgery (none the morning of surgery)  Take ONLY these medicines the morning of surgery with A SIP OF WATER: Ropinirole (Requip), Pantoprazole (Protonix), if needed hydrocodone and Tylenol   Bring CPAP mask and tubing day of surgery.                   You may not have any metal on your body including  jewelry, and body piercing  Do not wear lotions, powders, cologne, or deodorant  Men may shave face and neck.  Contacts, Hearing Aids, dentures or bridgework may not be worn into surgery.   You may bring a small overnight bag with you on the day of surgery, only pack items that are not valuable .[facility] IS NOT RESPONSIBLE   FOR VALUABLES THAT ARE LOST OR STOLEN.   DO NOT [place]. PHARMACY WILL DISPENSE MEDICATIONS LISTED ON YOUR MEDICATION LIST TO YOU DURING YOUR ADMISSION [facility]!    Special Instructions: Bring a copy of your healthcare power of attorney and living will documents the day of surgery, if you wish to have them scanned into your [facility] Medical Records- EPIC  Please read over the following fact sheets you were given: IF YOU HAVE QUESTIONS ABOUT YOUR PRE-OP INSTRUCTIONS, PLEASE CALL [phone]  ([place])   [facility] - Preparing for Surgery Before surgery, you can play an important role.  Because skin is not sterile, your skin needs to be as  free of germs as possible.  You can reduce the number of germs on your skin by washing with CHG (chlorahexidine gluconate) soap before surgery.  CHG is an antiseptic cleaner which kills germs and bonds with the skin to continue killing germs even after washing. Please DO NOT use if you have an allergy to CHG or antibacterial soaps.  If your skin becomes reddened/irritated stop using the CHG and inform your nurse when you arrive at Short Stay. Do not shave (including legs and underarms) for at least 48 hours prior to the first CHG shower.  You may shave your face/neck.  Please follow these instructions carefully:  1.  Shower with CHG Soap the night before surgery and the  morning of surgery.  2.  If you choose to wash your hair, wash your hair first as usual with your normal  shampoo.  3.  After you shampoo, rinse your hair and body thoroughly to remove the shampoo.                             4.  Use CHG as you would any other liquid soap.  You can apply chg directly to the skin and wash.  Gently with a scrungie or clean washcloth.  5.  Apply the CHG Soap to your body ONLY FROM THE NECK DOWN.   Do not use on face/ open                           Wound or open sores. Avoid contact with eyes, ears mouth and genitals (private parts).                       Wash face,  Genitals (private parts) with  your normal soap.             6.  Wash thoroughly, paying special attention to the area where your  surgery  will be performed.  7.  Thoroughly rinse your body with warm water from the neck down.  8.  DO NOT shower/wash with your normal soap after using and rinsing off the CHG Soap.            9.  Pat yourself dry with a clean towel.            10.  Wear clean pajamas.            11.  Place clean sheets on your bed the night of your first shower and do not  sleep with pets.  ON THE DAY OF SURGERY : Do not apply any lotions/deodorants the morning of surgery.  Please wear clean clothes to the hospital/surgery  center.    FAILURE TO FOLLOW THESE INSTRUCTIONS MAY RESULT IN THE CANCELLATION OF YOUR SURGERY  PATIENT SIGNATURE_________________________________  NURSE SIGNATURE__________________________________  ________________________________________________________________________    Adam Phenix    An incentive spirometer is a tool that can help keep your lungs clear and active. This tool measures how well you are filling your lungs with each breath. Taking long deep breaths may help reverse or decrease the chance of developing breathing (pulmonary) problems (especially infection) following: A long period of time when you are unable to move or be active. BEFORE THE PROCEDURE  If the spirometer includes an indicator to show your best effort, your nurse or respiratory therapist will set it to a desired goal. If possible, sit up straight or lean slightly forward. Try not to slouch. Hold the incentive spirometer in an upright position. INSTRUCTIONS FOR USE  Sit on the edge of your bed if possible, or sit up as far as you can in bed or on a chair. Hold the incentive spirometer in an upright position. Breathe out normally. Place the mouthpiece in your mouth and seal your lips tightly around it. Breathe in slowly and as deeply as possible, raising the piston or the ball toward the top of the column. Hold your breath for 3-5 seconds or for as long as possible. Allow the piston or ball to fall to the bottom of the column. Remove the mouthpiece from your mouth and breathe out normally. Rest for a few seconds and repeat Steps 1 through 7 at least 10 times every 1-2 hours when you are awake. Take your time and take a few normal breaths between deep breaths. The spirometer may include an indicator to show your best effort. Use the indicator as a goal to work toward during each repetition. After each set of 10 deep breaths, practice coughing to be sure your lungs are clear. If you have an incision  (the cut made at the time of surgery), support your incision when coughing by placing a pillow or rolled up towels firmly against it. Once you are able to get out of bed, walk around indoors and cough well. You may stop using the incentive spirometer when instructed by your caregiver.  RISKS AND COMPLICATIONS Take your time so you do not get dizzy or light-headed. If you are in pain, you may need to take or ask for pain medication before doing incentive spirometry. It is harder to take a deep breath if you are having pain. AFTER USE Rest and breathe slowly and easily. It can be helpful to keep track of a  log of your progress. Your caregiver can provide you with a simple table to help with this. If you are using the spirometer at home, follow these instructions: [place] IF:  You are having difficultly using the spirometer. You have trouble using the spirometer as often as instructed. Your pain medication is not giving enough relief while using the spirometer. You develop fever of 100.5 F (38.1 C) or higher.                                                                                                    SEEK IMMEDIATE MEDICAL CARE IF:  You cough up bloody sputum that had not been present before. You develop fever of 102 F (38.9 C) or greater. You develop worsening pain at or near the incision site. MAKE SURE YOU:  Understand these instructions. Will watch your condition. Will get help right away if you are not doing well or get worse. Document Released: 11/14/2006 Document Revised: 09/26/2011 Document Reviewed: 01/15/2007 [facility] Patient Information 2014 [place], [place].

## 2022-03-14 NOTE — ED Provider Triage Note (Signed)
Emergency Medicine Provider Triage Evaluation Note  Manuel Grippe Sr. , a 81 y.o. male  was evaluated in triage.  Pt complains of uncontrolled atrial fibrillation.  This was noted during preoperative assessment for upcoming hip surgery today.  He has a history of A-fib.  He is never symptomatic from it.  No current symptoms including chest pain or shortness of breath.  He is on apixaban.  He is not on anything for rate control.  Sees cardiology with [facility].  Review of Systems  Positive:  Negative: Fever, chest pain, shortness of breath  Physical Exam  BP (!) 140/88 (BP Location: Left Arm)   Pulse (!) 122   Temp 98 F (36.7 C) (Oral)   Resp 18   SpO2 95%  Gen:   Awake, no distress   Resp:  Normal effort  MSK:   Moves extremities without difficulty  Other:  Irregular rhythm, tachycardia  Medical Decision Making  Medically screening exam initiated at 5:23 PM.  Appropriate orders placed.  Manuel Grippe Sr. was informed that the remainder of the evaluation will be completed by another provider, this initial triage assessment does not replace that evaluation, and the importance of remaining in the ED until their evaluation is complete.     Carlisle Cater, PA-C 03/14/22 1730

## 2022-03-14 NOTE — Telephone Encounter (Signed)
Pt called asking for a call back. Pt states he got an injection in back and pains are worse. Pt also states he has surgery this this Friday and need advice. Please call pt at [phone].

## 2022-03-14 NOTE — ED Triage Notes (Signed)
Patient states that he came for pre-op to have a hip replacement on Friday. Patient was found to have atrial fib and his heart rate was bouncing from low to high a rate.  Patient denies any chest pain or SOB.

## 2022-03-14 NOTE — Telephone Encounter (Signed)
Patient said unfortunately the injections Friday didn't help him at all, wondering what else we could do? He is very anxious since he is supposed to get TKA Friday

## 2022-03-14 NOTE — ED Notes (Signed)
Pt c/o back pain from muscle strain

## 2022-03-15 ENCOUNTER — Telehealth: Payer: Self-pay

## 2022-03-15 ENCOUNTER — Telehealth (HOSPITAL_COMMUNITY): Payer: Self-pay

## 2022-03-15 ENCOUNTER — Other Ambulatory Visit: Payer: Self-pay | Admitting: Orthopaedic Surgery

## 2022-03-15 MED ORDER — HYDROCODONE-ACETAMINOPHEN 10-325 MG PO TABS: 1.0000 | ORAL_TABLET | Freq: Four times a day (QID) | ORAL | 0 refills | Status: DC | PRN

## 2022-03-15 NOTE — Telephone Encounter (Signed)
Patient asking if he can have some Hydrocodone

## 2022-03-15 NOTE — Telephone Encounter (Signed)
Reached out to patient to schedule ED f/u appointment. No answer left voicemail. 

## 2022-03-16 NOTE — Telephone Encounter (Signed)
Spoke with patient and advised that anesthesia is requiring cardiac clearance due to ED visit for AFib.  Will postpone surgery to 04/01/22 to allow time for this.

## 2022-03-18 LAB — TYPE AND SCREEN
ABO/RH(D): A POS
Antibody Screen: NEGATIVE

## 2022-03-30 ENCOUNTER — Other Ambulatory Visit: Payer: Self-pay

## 2022-03-30 ENCOUNTER — Other Ambulatory Visit: Payer: Self-pay | Admitting: Physician Assistant

## 2022-03-30 NOTE — Progress Notes (Signed)
Received Cardiac Clearance from Dr. Lawson Radar on 03/29/22 placed in patient's chart.

## 2022-03-31 ENCOUNTER — Encounter: Payer: Medicare HMO | Admitting: Orthopaedic Surgery

## 2022-03-31 NOTE — Anesthesia Preprocedure Evaluation (Addendum)
Anesthesia Evaluation  Patient identified by MRN, date of birth, ID band Patient awake    Reviewed: Allergy & Precautions, NPO status , Patient's Chart, lab work & pertinent test results  History of Anesthesia Complications Negative for: history of anesthetic complications  Airway Mallampati: II  TM Distance: >3 FB Neck ROM: Full    Dental no notable dental hx. (+) Dental Advisory Given   Pulmonary sleep apnea and Continuous Positive Airway Pressure Ventilation ,    Pulmonary exam normal        Cardiovascular hypertension, Pt. on medications + dysrhythmias Atrial Fibrillation  Rhythm:Irregular Rate:Normal     Neuro/Psych negative neurological ROS  negative psych ROS   GI/Hepatic Neg liver ROS, GERD  Medicated,  Endo/Other  diabetesMorbid obesity  Renal/GU negative Renal ROS  negative genitourinary   Musculoskeletal negative musculoskeletal ROS (+)   Abdominal   Peds negative pediatric ROS (+)  Hematology negative hematology ROS (+)   Anesthesia Other Findings   Reproductive/Obstetrics negative OB ROS                            Anesthesia Physical  Anesthesia Plan  ASA: 3  Anesthesia Plan: Spinal and MAC   Post-op Pain Management:  Regional for Post-op pain and Celebrex PO (pre-op)* and Tylenol PO (pre-op)*   Induction:   PONV Risk Score and Plan: 2 and Ondansetron and Propofol infusion  Airway Management Planned: Natural Airway and Simple Face Mask  Additional Equipment:   Intra-op Plan:   Post-operative Plan:   Informed Consent: I have reviewed the patients History and Physical, chart, labs and discussed the procedure including the risks, benefits and alternatives for the proposed anesthesia with the patient or authorized representative who has indicated his/her understanding and acceptance.     Dental advisory given  Plan Discussed with: Anesthesiologist, CRNA and  Surgeon  Anesthesia Plan Comments:       Anesthesia Quick Evaluation

## 2022-03-31 NOTE — H&P (Signed)
TOTAL HIP ADMISSION H&P  Patient is admitted for right total hip arthroplasty.  Subjective:  Chief Complaint: right hip pain  HPI: Manuel Grippe Sr., 81 y.o. male, has a history of pain and functional disability in the right hip(s) due to arthritis and patient has failed non-surgical conservative treatments for greater than 12 weeks to include NSAID's and/or analgesics, corticosteriod injections, use of assistive devices, and activity modification.  Onset of symptoms was abrupt starting just several months ago with rapidlly worsening course since that time.The patient noted no past surgery on the right hip(s).  Patient currently rates pain in the right hip at 10 out of 10 with activity. Patient has night pain, worsening of pain with activity and weight bearing, trendelenberg gait, pain that interfers with activities of daily living, and pain with passive range of motion. Patient has evidence of subchondral sclerosis, periarticular osteophytes, and joint space narrowing by imaging studies. This condition presents safety issues increasing the risk of falls.  There is no current active infection.  Patient Active Problem List   Diagnosis Date Noted   Unilateral primary osteoarthritis, right hip 12/27/2021   H/O total shoulder replacement, left 02/14/2020   Unilateral primary osteoarthritis, right knee 03/23/2018   Status post total knee replacement, right 03/23/2018   Unilateral primary osteoarthritis, left hip 09/13/2016   Status post left hip replacement 09/13/2016   Past Medical History:  Diagnosis Date   Arthritis    Atrial fibrillation ([place])    Complication of anesthesia    States "I woke up to soon one time" with rotator cuff repair   Diabetes mellitus without complication ([place])    Dysrhythmia    GERD (gastroesophageal reflux disease)    History of hiatal hernia    Hypertension    Lower extremity edema    occasionally takes furosemide prn   Prostate cancer ([place]) 2007   tx with  surgery   RLS (restless legs syndrome)    Rotator cuff tear    right   Sleep apnea    uses CPAP nightly    Past Surgical History:  Procedure Laterality Date   FINGER ARTHROPLASTY Left 03/10/2017   Procedure: Left index finger metacarpal phalangeal arthroplasty;  Surgeon: Roseanne Kaufman, MD;  Location: [facility];  Service: Orthopedics;  Laterality: Left;  90 mins   JOINT REPLACEMENT     KNEE SURGERY Bilateral    left knee replacment  2009   MANDIBLE FRACTURE SURGERY     left 2 titanium plates and 10 scres   NECK SURGERY     cervical with bone graft   PROSTATE SURGERY     prostatctomy   REVERSE SHOULDER ARTHROPLASTY Left 02/14/2020   Procedure: REVERSE SHOULDER ARTHROPLASTY;  Surgeon: Netta Cedars, MD;  Location: [facility];  Service: Orthopedics;  Laterality: Left;  Interscalene block   ROTATOR CUFF REPAIR     x 2 right x 2 left   TOTAL HIP ARTHROPLASTY Left 09/13/2016   Procedure: LEFT TOTAL HIP ARTHROPLASTY ANTERIOR APPROACH;  Surgeon: [name], MD;  Location: [facility];  Service: Orthopedics;  Laterality: Left;   TOTAL KNEE ARTHROPLASTY Right 03/23/2018   Procedure: RIGHT TOTAL KNEE ARTHROPLASTY;  Surgeon: [name], MD;  Location: [facility];  Service: Orthopedics;  Laterality: Right;    No current facility-administered medications for this encounter.   Current Outpatient Medications  Medication Sig Dispense Refill Last Dose   acetaminophen (TYLENOL) 500 MG tablet Take 1,000 mg by mouth every 8 (eight) hours as  needed for moderate pain.      apixaban (ELIQUIS) 2.5 MG TABS tablet Take 2.5 mg by mouth 2 (two) times daily.      atorvastatin (LIPITOR) 10 MG tablet Take 10 mg by mouth at bedtime.      baclofen (LIORESAL) 10 MG tablet Take 1 tablet (10 mg total) by mouth 3 (three) times daily as needed for muscle spasms. (Patient not taking: Reported on 03/14/2022) 40 each 1    furosemide (LASIX) 20 MG tablet Take 20 mg by mouth daily as needed for edema.       gabapentin (NEURONTIN) 100 MG capsule Take 2 capsules (200 mg total) by mouth at bedtime. 60 capsule 1    lisinopril (ZESTRIL) 2.5 MG tablet Take 2.5 mg by mouth daily.      methocarbamol (ROBAXIN) 500 MG tablet Take 1 tablet (500 mg total) by mouth every 6 (six) hours as needed for muscle spasms. (Patient not taking: Reported on 03/14/2022) 50 tablet 0    pantoprazole (PROTONIX) 40 MG tablet Take 40 mg by mouth every morning.       phenytoin (DILANTIN) 100 MG ER capsule Take 200 mg by mouth at bedtime.      rOPINIRole (REQUIP) 1 MG tablet Take 1 mg by mouth 3 (three) times daily with meals.       rOPINIRole (REQUIP) 3 MG tablet Take 3 mg by mouth at bedtime.      SitaGLIPtin-MetFORMIN HCl (JANUMET XR) 50-1000 MG TB24 Take 1 tablet by mouth at bedtime.      Vitamin D, Ergocalciferol, (DRISDOL) 1.25 MG (50000 UNIT) CAPS capsule Take 50,000 Units by mouth every Monday.      diltiazem (CARDIZEM CD) 120 MG 24 hr capsule Take 1 capsule (120 mg total) by mouth daily. 30 capsule 0    HYDROcodone-acetaminophen (NORCO) 10-325 MG tablet Take 1 tablet by mouth every 6 (six) hours as needed for moderate pain or severe pain. 30 tablet 0    OZEMPIC, 0.25 OR 0.5 MG/DOSE, 2 MG/3ML SOPN Inject 0.25 mg into the skin every Thursday.      Allergies  Allergen Reactions   Morphine Nausea And Vomiting    Social History   Tobacco Use   Smoking status: Never   Smokeless tobacco: Never  Substance Use Topics   Alcohol use: Never    No family history on file.   Review of Systems  Objective:  Physical Exam Vitals reviewed.  Constitutional:      Appearance: Normal appearance.  HENT:     Head: Normocephalic and atraumatic.  Eyes:     Extraocular Movements: Extraocular movements intact.     Pupils: Pupils are equal, round, and reactive to light.  Cardiovascular:     Rate and Rhythm: Normal rate. Rhythm irregular.  Pulmonary:     Effort: Pulmonary effort is normal.     Breath sounds: Normal breath  sounds.  Abdominal:     Palpations: Abdomen is soft.  Musculoskeletal:     Cervical back: Normal range of motion and neck supple.     Right hip: Tenderness and bony tenderness present. Decreased range of motion. Decreased strength.  Neurological:     Mental Status: He is alert and oriented to person, place, and time.  Psychiatric:        Behavior: Behavior normal.     Vital signs in last 24 hours:    Labs:   Estimated body mass index is 38.25 kg/m as calculated from the following:   Height as  of 03/14/22: '5\' 6"'$  (1.676 m).   Weight as of 03/14/22: 107.5 kg.   Imaging Review Plain radiographs demonstrate severe degenerative joint disease of the right hip(s). The bone quality appears to be good for age and reported activity level.      Assessment/Plan:  End stage arthritis, right hip(s)  The patient history, physical examination, clinical judgement of the provider and imaging studies are consistent with end stage degenerative joint disease of the right hip(s) and total hip arthroplasty is deemed medically necessary. The treatment options including medical management, injection therapy, arthroscopy and arthroplasty were discussed at length. The risks and benefits of total hip arthroplasty were presented and reviewed. The risks due to aseptic loosening, infection, stiffness, dislocation/subluxation,  thromboembolic complications and other imponderables were discussed.  The patient acknowledged the explanation, agreed to proceed with the plan and consent was signed. Patient is being admitted for inpatient treatment for surgery, pain control, PT, OT, prophylactic antibiotics, VTE prophylaxis, progressive ambulation and ADL's and discharge planning.The patient is planning to be discharged home with home health services

## 2022-04-01 ENCOUNTER — Ambulatory Visit (HOSPITAL_BASED_OUTPATIENT_CLINIC_OR_DEPARTMENT_OTHER): Payer: Medicare HMO | Admitting: Anesthesiology

## 2022-04-01 ENCOUNTER — Encounter (HOSPITAL_COMMUNITY): Payer: Self-pay | Admitting: Orthopaedic Surgery

## 2022-04-01 ENCOUNTER — Ambulatory Visit (HOSPITAL_COMMUNITY): Payer: Medicare HMO

## 2022-04-01 ENCOUNTER — Other Ambulatory Visit: Payer: Self-pay

## 2022-04-01 ENCOUNTER — Inpatient Hospital Stay (HOSPITAL_COMMUNITY)
Admission: RE | Admit: 2022-04-01 | Discharge: 2022-04-08 | DRG: 470 | Disposition: A | Discharge status: Home Health | Payer: Medicare HMO | Attending: Orthopaedic Surgery | Admitting: Orthopaedic Surgery

## 2022-04-01 ENCOUNTER — Ambulatory Visit (HOSPITAL_COMMUNITY): Payer: Medicare HMO | Admitting: Physician Assistant

## 2022-04-01 ENCOUNTER — Encounter (HOSPITAL_COMMUNITY)
Admission: RE | Disposition: A | Discharge status: Home Health | Payer: Self-pay | Source: Home / Self Care | Attending: Orthopaedic Surgery

## 2022-04-01 ENCOUNTER — Observation Stay (HOSPITAL_COMMUNITY): Payer: Medicare HMO

## 2022-04-01 DIAGNOSIS — I4821 Permanent atrial fibrillation: Secondary | ICD-10-CM | POA: Diagnosis present

## 2022-04-01 DIAGNOSIS — Z7985 Long-term (current) use of injectable non-insulin antidiabetic drugs: Secondary | ICD-10-CM

## 2022-04-01 DIAGNOSIS — Z79899 Other long term (current) drug therapy: Secondary | ICD-10-CM

## 2022-04-01 DIAGNOSIS — Z8546 Personal history of malignant neoplasm of prostate: Secondary | ICD-10-CM

## 2022-04-01 DIAGNOSIS — I4891 Unspecified atrial fibrillation: Secondary | ICD-10-CM | POA: Diagnosis not present

## 2022-04-01 DIAGNOSIS — E119 Type 2 diabetes mellitus without complications: Secondary | ICD-10-CM

## 2022-04-01 DIAGNOSIS — Z96651 Presence of right artificial knee joint: Secondary | ICD-10-CM | POA: Diagnosis present

## 2022-04-01 DIAGNOSIS — M1611 Unilateral primary osteoarthritis, right hip: Principal | ICD-10-CM | POA: Diagnosis present

## 2022-04-01 DIAGNOSIS — I10 Essential (primary) hypertension: Secondary | ICD-10-CM | POA: Diagnosis not present

## 2022-04-01 DIAGNOSIS — K219 Gastro-esophageal reflux disease without esophagitis: Secondary | ICD-10-CM | POA: Diagnosis present

## 2022-04-01 DIAGNOSIS — E669 Obesity, unspecified: Secondary | ICD-10-CM | POA: Diagnosis present

## 2022-04-01 DIAGNOSIS — Z01818 Encounter for other preprocedural examination: Principal | ICD-10-CM

## 2022-04-01 DIAGNOSIS — Z96642 Presence of left artificial hip joint: Secondary | ICD-10-CM | POA: Diagnosis present

## 2022-04-01 DIAGNOSIS — Z96612 Presence of left artificial shoulder joint: Secondary | ICD-10-CM | POA: Diagnosis present

## 2022-04-01 DIAGNOSIS — Z96641 Presence of right artificial hip joint: Secondary | ICD-10-CM

## 2022-04-01 DIAGNOSIS — Z7901 Long term (current) use of anticoagulants: Secondary | ICD-10-CM

## 2022-04-01 DIAGNOSIS — Z8249 Family history of ischemic heart disease and other diseases of the circulatory system: Secondary | ICD-10-CM

## 2022-04-01 DIAGNOSIS — G2581 Restless legs syndrome: Secondary | ICD-10-CM | POA: Diagnosis present

## 2022-04-01 DIAGNOSIS — E782 Mixed hyperlipidemia: Secondary | ICD-10-CM | POA: Diagnosis present

## 2022-04-01 HISTORY — DX: Type 2 diabetes mellitus without complications: E11.9

## 2022-04-01 HISTORY — PX: TOTAL HIP ARTHROPLASTY: SHX124

## 2022-04-01 LAB — GLUCOSE, CAPILLARY
Glucose-Capillary: 152 mg/dL — ABNORMAL HIGH (ref 70–99)
Glucose-Capillary: 137 mg/dL — ABNORMAL HIGH (ref 70–99)
Glucose-Capillary: 199 mg/dL — ABNORMAL HIGH (ref 70–99)
Glucose-Capillary: 285 mg/dL — ABNORMAL HIGH (ref 70–99)
Glucose-Capillary: 191 mg/dL — ABNORMAL HIGH (ref 70–99)

## 2022-04-01 LAB — TYPE AND SCREEN
ABO/RH(D): A POS
Antibody Screen: NEGATIVE

## 2022-04-01 SURGERY — ARTHROPLASTY, HIP, TOTAL, ANTERIOR APPROACH
Anesthesia: Monitor Anesthesia Care | Site: Hip | Laterality: Right

## 2022-04-01 MED ORDER — ACETAMINOPHEN 500 MG PO TABS
1000.0000 mg | ORAL_TABLET | Freq: Once | ORAL | Status: AC
  Administered 2022-04-01: 1000 mg via ORAL
  Filled 2022-04-01: qty 2

## 2022-04-01 MED ORDER — SITAGLIP PHOS-METFORMIN HCL ER 50-1000 MG PO TB24: 1.0000 | ORAL_TABLET | Freq: Every day | ORAL | Status: DC

## 2022-04-01 MED ORDER — ALBUMIN HUMAN 5 % IV SOLN
INTRAVENOUS | Status: AC
  Filled 2022-04-01: qty 250

## 2022-04-01 MED ORDER — BUPIVACAINE IN DEXTROSE 0.75-8.25 % IT SOLN
INTRATHECAL | Status: DC | PRN
  Administered 2022-04-01: 1.8 mL via INTRATHECAL

## 2022-04-01 MED ORDER — METOCLOPRAMIDE HCL 5 MG PO TABS: 5.0000 mg | ORAL_TABLET | Freq: Three times a day (TID) | ORAL | Status: DC | PRN

## 2022-04-01 MED ORDER — ALUM & MAG HYDROXIDE-SIMETH 200-200-20 MG/5ML PO SUSP: 30.0000 mL | ORAL | Status: DC | PRN

## 2022-04-01 MED ORDER — AMISULPRIDE (ANTIEMETIC) 5 MG/2ML IV SOLN: 10.0000 mg | Freq: Once | INTRAVENOUS | Status: DC | PRN

## 2022-04-01 MED ORDER — STERILE WATER FOR IRRIGATION IR SOLN
Status: DC | PRN
  Administered 2022-04-01: 2000 mL

## 2022-04-01 MED ORDER — HYDROMORPHONE HCL 1 MG/ML IJ SOLN
0.5000 mg | INTRAMUSCULAR | Status: DC | PRN
  Administered 2022-04-01 – 2022-04-03 (×2): 1 mg via INTRAVENOUS
  Filled 2022-04-01 (×2): qty 1

## 2022-04-01 MED ORDER — DEXAMETHASONE SODIUM PHOSPHATE 10 MG/ML IJ SOLN
INTRAMUSCULAR | Status: AC
  Filled 2022-04-01: qty 1

## 2022-04-01 MED ORDER — PROPOFOL 1000 MG/100ML IV EMUL
INTRAVENOUS | Status: AC
  Filled 2022-04-01: qty 100

## 2022-04-01 MED ORDER — PHENYLEPHRINE HCL (PRESSORS) 10 MG/ML IV SOLN
INTRAVENOUS | Status: AC
  Filled 2022-04-01: qty 1

## 2022-04-01 MED ORDER — METFORMIN HCL ER 500 MG PO TB24
1000.0000 mg | ORAL_TABLET | Freq: Every day | ORAL | Status: DC
  Administered 2022-04-02 – 2022-04-08 (×7): 1000 mg via ORAL
  Filled 2022-04-01 (×7): qty 2

## 2022-04-01 MED ORDER — INSULIN ASPART 100 UNIT/ML IJ SOLN
0.0000 [IU] | Freq: Three times a day (TID) | INTRAMUSCULAR | Status: DC
  Administered 2022-04-01: 8 [IU] via SUBCUTANEOUS
  Administered 2022-04-02 – 2022-04-05 (×11): 3 [IU] via SUBCUTANEOUS
  Administered 2022-04-05 – 2022-04-06 (×3): 2 [IU] via SUBCUTANEOUS
  Administered 2022-04-06 – 2022-04-07 (×2): 3 [IU] via SUBCUTANEOUS
  Administered 2022-04-07 (×2): 2 [IU] via SUBCUTANEOUS
  Administered 2022-04-08: 3 [IU] via SUBCUTANEOUS

## 2022-04-01 MED ORDER — ROPINIROLE HCL 1 MG PO TABS
1.0000 mg | ORAL_TABLET | Freq: Three times a day (TID) | ORAL | Status: DC
  Administered 2022-04-01 – 2022-04-08 (×20): 1 mg via ORAL
  Filled 2022-04-01 (×21): qty 1

## 2022-04-01 MED ORDER — CEFAZOLIN SODIUM-DEXTROSE 1-4 GM/50ML-% IV SOLN
1.0000 g | Freq: Four times a day (QID) | INTRAVENOUS | Status: AC
  Administered 2022-04-01 (×2): 1 g via INTRAVENOUS
  Filled 2022-04-01 (×2): qty 50

## 2022-04-01 MED ORDER — ONDANSETRON HCL 4 MG/2ML IJ SOLN: 4.0000 mg | Freq: Four times a day (QID) | INTRAMUSCULAR | Status: DC | PRN

## 2022-04-01 MED ORDER — METHOCARBAMOL 500 MG PO TABS
500.0000 mg | ORAL_TABLET | Freq: Four times a day (QID) | ORAL | Status: DC | PRN
  Administered 2022-04-01 – 2022-04-05 (×8): 500 mg via ORAL
  Filled 2022-04-01 (×9): qty 1

## 2022-04-01 MED ORDER — ATORVASTATIN CALCIUM 10 MG PO TABS
10.0000 mg | ORAL_TABLET | Freq: Every day | ORAL | Status: DC
  Administered 2022-04-01 – 2022-04-07 (×7): 10 mg via ORAL
  Filled 2022-04-01 (×7): qty 1

## 2022-04-01 MED ORDER — PHENYTOIN SODIUM EXTENDED 100 MG PO CAPS
200.0000 mg | ORAL_CAPSULE | Freq: Every day | ORAL | Status: DC
  Administered 2022-04-01: 200 mg via ORAL
  Filled 2022-04-01: qty 2

## 2022-04-01 MED ORDER — DOCUSATE SODIUM 100 MG PO CAPS
100.0000 mg | ORAL_CAPSULE | Freq: Two times a day (BID) | ORAL | Status: DC
  Administered 2022-04-01 – 2022-04-08 (×15): 100 mg via ORAL
  Filled 2022-04-01 (×16): qty 1

## 2022-04-01 MED ORDER — DILTIAZEM HCL ER COATED BEADS 120 MG PO CP24
120.0000 mg | ORAL_CAPSULE | Freq: Every day | ORAL | Status: DC
  Administered 2022-04-02: 120 mg via ORAL
  Filled 2022-04-01: qty 1

## 2022-04-01 MED ORDER — CELECOXIB 200 MG PO CAPS
200.0000 mg | ORAL_CAPSULE | Freq: Once | ORAL | Status: AC
  Administered 2022-04-01: 200 mg via ORAL
  Filled 2022-04-01: qty 1

## 2022-04-01 MED ORDER — ACETAMINOPHEN 325 MG PO TABS
325.0000 mg | ORAL_TABLET | Freq: Four times a day (QID) | ORAL | Status: DC | PRN
  Administered 2022-04-02 – 2022-04-07 (×4): 650 mg via ORAL
  Filled 2022-04-01 (×4): qty 2

## 2022-04-01 MED ORDER — ALBUMIN HUMAN 5 % IV SOLN: INTRAVENOUS | Status: DC | PRN

## 2022-04-01 MED ORDER — METOCLOPRAMIDE HCL 5 MG/ML IJ SOLN: 5.0000 mg | Freq: Three times a day (TID) | INTRAMUSCULAR | Status: DC | PRN

## 2022-04-01 MED ORDER — PHENYLEPHRINE 80 MCG/ML (10ML) SYRINGE FOR IV PUSH (FOR BLOOD PRESSURE SUPPORT)
PREFILLED_SYRINGE | INTRAVENOUS | Status: DC | PRN
  Administered 2022-04-01 (×6): 160 ug via INTRAVENOUS

## 2022-04-01 MED ORDER — INSULIN ASPART 100 UNIT/ML IJ SOLN
0.0000 [IU] | Freq: Every day | INTRAMUSCULAR | Status: DC
  Administered 2022-04-03: 2 [IU] via SUBCUTANEOUS

## 2022-04-01 MED ORDER — ONDANSETRON HCL 4 MG/2ML IJ SOLN
INTRAMUSCULAR | Status: DC | PRN
  Administered 2022-04-01: 4 mg via INTRAVENOUS

## 2022-04-01 MED ORDER — APIXABAN 2.5 MG PO TABS
2.5000 mg | ORAL_TABLET | Freq: Two times a day (BID) | ORAL | Status: DC
  Administered 2022-04-02: 2.5 mg via ORAL
  Filled 2022-04-01: qty 1

## 2022-04-01 MED ORDER — ORAL CARE MOUTH RINSE: 15.0000 mL | Freq: Once | OROMUCOSAL | Status: AC

## 2022-04-01 MED ORDER — 0.9 % SODIUM CHLORIDE (POUR BTL) OPTIME
TOPICAL | Status: DC | PRN
  Administered 2022-04-01: 1000 mL

## 2022-04-01 MED ORDER — DEXAMETHASONE SODIUM PHOSPHATE 10 MG/ML IJ SOLN
INTRAMUSCULAR | Status: DC | PRN
  Administered 2022-04-01: 8 mg via INTRAVENOUS

## 2022-04-01 MED ORDER — DIPHENHYDRAMINE HCL 12.5 MG/5ML PO ELIX
12.5000 mg | ORAL_SOLUTION | ORAL | Status: DC | PRN
  Administered 2022-04-02: 25 mg via ORAL
  Filled 2022-04-01: qty 10

## 2022-04-01 MED ORDER — GABAPENTIN 100 MG PO CAPS
200.0000 mg | ORAL_CAPSULE | Freq: Every day | ORAL | Status: DC
  Administered 2022-04-01 – 2022-04-07 (×7): 200 mg via ORAL
  Filled 2022-04-01 (×7): qty 2

## 2022-04-01 MED ORDER — SODIUM CHLORIDE 0.9 % IV SOLN: INTRAVENOUS | Status: DC

## 2022-04-01 MED ORDER — PHENYLEPHRINE HCL-NACL 20-0.9 MG/250ML-% IV SOLN
INTRAVENOUS | Status: DC | PRN
  Administered 2022-04-01: 50 ug/min via INTRAVENOUS

## 2022-04-01 MED ORDER — PANTOPRAZOLE SODIUM 40 MG PO TBEC
40.0000 mg | DELAYED_RELEASE_TABLET | Freq: Every day | ORAL | Status: DC
  Administered 2022-04-02 – 2022-04-08 (×7): 40 mg via ORAL
  Filled 2022-04-01 (×7): qty 1

## 2022-04-01 MED ORDER — POLYETHYLENE GLYCOL 3350 17 G PO PACK
17.0000 g | PACK | Freq: Every day | ORAL | Status: DC | PRN
  Administered 2022-04-05 – 2022-04-07 (×3): 17 g via ORAL
  Filled 2022-04-01 (×3): qty 1

## 2022-04-01 MED ORDER — ROPINIROLE HCL 1 MG PO TABS
3.0000 mg | ORAL_TABLET | Freq: Every day | ORAL | Status: DC
  Administered 2022-04-01 – 2022-04-07 (×7): 3 mg via ORAL
  Filled 2022-04-01 (×7): qty 3

## 2022-04-01 MED ORDER — FENTANYL CITRATE PF 50 MCG/ML IJ SOSY: 25.0000 ug | PREFILLED_SYRINGE | INTRAMUSCULAR | Status: DC | PRN

## 2022-04-01 MED ORDER — METHOCARBAMOL 1000 MG/10ML IJ SOLN: 500.0000 mg | Freq: Four times a day (QID) | INTRAVENOUS | Status: DC | PRN

## 2022-04-01 MED ORDER — PROPOFOL 10 MG/ML IV BOLUS
INTRAVENOUS | Status: AC
  Filled 2022-04-01: qty 20

## 2022-04-01 MED ORDER — ONDANSETRON HCL 4 MG PO TABS: 4.0000 mg | ORAL_TABLET | Freq: Four times a day (QID) | ORAL | Status: DC | PRN

## 2022-04-01 MED ORDER — LACTATED RINGERS IV SOLN: INTRAVENOUS | Status: DC

## 2022-04-01 MED ORDER — TRANEXAMIC ACID-NACL 1000-0.7 MG/100ML-% IV SOLN
1000.0000 mg | INTRAVENOUS | Status: AC
  Administered 2022-04-01: 1000 mg via INTRAVENOUS
  Filled 2022-04-01: qty 100

## 2022-04-01 MED ORDER — CHLORHEXIDINE GLUCONATE 0.12 % MT SOLN
15.0000 mL | Freq: Once | OROMUCOSAL | Status: AC
  Administered 2022-04-01: 15 mL via OROMUCOSAL

## 2022-04-01 MED ORDER — LINAGLIPTIN 5 MG PO TABS
5.0000 mg | ORAL_TABLET | Freq: Every day | ORAL | Status: DC
  Administered 2022-04-02 – 2022-04-08 (×7): 5 mg via ORAL
  Filled 2022-04-01 (×7): qty 1

## 2022-04-01 MED ORDER — HYDROMORPHONE HCL 2 MG PO TABS
2.0000 mg | ORAL_TABLET | ORAL | Status: DC | PRN
  Administered 2022-04-01 – 2022-04-02 (×3): 2 mg via ORAL
  Filled 2022-04-01 (×3): qty 1

## 2022-04-01 MED ORDER — PHENOL 1.4 % MT LIQD: 1.0000 | OROMUCOSAL | Status: DC | PRN

## 2022-04-01 MED ORDER — OXYCODONE HCL 5 MG PO TABS
5.0000 mg | ORAL_TABLET | ORAL | Status: DC | PRN
  Administered 2022-04-01 – 2022-04-08 (×18): 10 mg via ORAL
  Filled 2022-04-01 (×21): qty 2

## 2022-04-01 MED ORDER — SODIUM CHLORIDE 0.9 % IR SOLN
Status: DC | PRN
  Administered 2022-04-01: 1000 mL via INTRAVESICAL

## 2022-04-01 MED ORDER — PROPOFOL 500 MG/50ML IV EMUL
INTRAVENOUS | Status: DC | PRN
  Administered 2022-04-01: 80 ug/kg/min via INTRAVENOUS

## 2022-04-01 MED ORDER — POVIDONE-IODINE 10 % EX SWAB
2.0000 | Freq: Once | CUTANEOUS | Status: AC
  Administered 2022-04-01: 2 via TOPICAL

## 2022-04-01 MED ORDER — ASPIRIN 81 MG PO CHEW
81.0000 mg | CHEWABLE_TABLET | Freq: Every day | ORAL | Status: DC
  Administered 2022-04-01 – 2022-04-02 (×2): 81 mg via ORAL
  Filled 2022-04-01 (×2): qty 1

## 2022-04-01 MED ORDER — MENTHOL 3 MG MT LOZG: 1.0000 | LOZENGE | OROMUCOSAL | Status: DC | PRN

## 2022-04-01 MED ORDER — ONDANSETRON HCL 4 MG/2ML IJ SOLN
INTRAMUSCULAR | Status: AC
  Filled 2022-04-01: qty 2

## 2022-04-01 MED ORDER — PHENYLEPHRINE 80 MCG/ML (10ML) SYRINGE FOR IV PUSH (FOR BLOOD PRESSURE SUPPORT)
PREFILLED_SYRINGE | INTRAVENOUS | Status: AC
  Filled 2022-04-01: qty 10

## 2022-04-01 MED ORDER — VITAMIN D (ERGOCALCIFEROL) 1.25 MG (50000 UNIT) PO CAPS
50000.0000 [IU] | ORAL_CAPSULE | ORAL | Status: DC
  Administered 2022-04-04: 50000 [IU] via ORAL
  Filled 2022-04-01: qty 1

## 2022-04-01 MED ORDER — CEFAZOLIN SODIUM-DEXTROSE 2-4 GM/100ML-% IV SOLN
2.0000 g | INTRAVENOUS | Status: AC
  Administered 2022-04-01: 2 g via INTRAVENOUS
  Filled 2022-04-01: qty 100

## 2022-04-01 MED ORDER — PROMETHAZINE HCL 25 MG/ML IJ SOLN: 6.2500 mg | INTRAMUSCULAR | Status: DC | PRN

## 2022-04-01 SURGICAL SUPPLY — 45 items
APL SKNCLS STERI-STRIP NONHPOA (GAUZE/BANDAGES/DRESSINGS)
BAG COUNTER SPONGE SURGICOUNT (BAG) ×2 IMPLANT
BAG SPEC THK2 15X12 ZIP CLS (MISCELLANEOUS)
BAG SPNG CNTER NS LX DISP (BAG) ×1
BAG ZIPLOCK 12X15 (MISCELLANEOUS) IMPLANT
BENZOIN TINCTURE PRP APPL 2/3 (GAUZE/BANDAGES/DRESSINGS) IMPLANT
BLADE SAW SGTL 18X1.27X75 (BLADE) ×2 IMPLANT
COLLAR OFFSET CORAIL SZ 11 HIP (Stem) IMPLANT
CORAIL OFFSET COLLAR SZ 11 HIP (Stem) ×1 IMPLANT
COVER PERINEAL POST (MISCELLANEOUS) ×2 IMPLANT
COVER SURGICAL LIGHT HANDLE (MISCELLANEOUS) ×2 IMPLANT
CUP ACET PNNCL SECTR W/GRIP 56 (Hips) IMPLANT
DRAPE FOOT SWITCH (DRAPES) ×2 IMPLANT
DRAPE STERI IOBAN 125X83 (DRAPES) ×2 IMPLANT
DRAPE U-SHAPE 47X51 STRL (DRAPES) ×4 IMPLANT
DRSG AQUACEL AG ADV 3.5X10 (GAUZE/BANDAGES/DRESSINGS) ×2 IMPLANT
DRSG XEROFORM 1X8 (GAUZE/BANDAGES/DRESSINGS) IMPLANT
DURAPREP 26ML APPLICATOR (WOUND CARE) ×2 IMPLANT
ELECT REM PT RETURN 15FT ADLT (MISCELLANEOUS) ×2 IMPLANT
GAUZE XEROFORM 1X8 LF (GAUZE/BANDAGES/DRESSINGS) ×2 IMPLANT
GLOVE BIO SURGEON STRL SZ7.5 (GLOVE) ×2 IMPLANT
GLOVE BIOGEL PI IND STRL 8 (GLOVE) ×4 IMPLANT
GLOVE ECLIPSE 8.0 STRL XLNG CF (GLOVE) ×2 IMPLANT
GOWN STRL REUS W/ TWL XL LVL3 (GOWN DISPOSABLE) ×4 IMPLANT
GOWN STRL REUS W/TWL XL LVL3 (GOWN DISPOSABLE) ×2
HANDPIECE INTERPULSE COAX TIP (DISPOSABLE) ×1
HEAD M SROM 36MM PLUS 1.5 (Hips) IMPLANT
HOLDER FOLEY CATH W/STRAP (MISCELLANEOUS) ×2 IMPLANT
KIT TURNOVER KIT A (KITS) IMPLANT
LINER NEUTRAL 52MMX36MMX56N (Liner) IMPLANT
PACK ANTERIOR HIP CUSTOM (KITS) ×2 IMPLANT
PINN SECTOR W/GRIP ACE CUP 56 (Hips) ×1 IMPLANT
SET HNDPC FAN SPRY TIP SCT (DISPOSABLE) ×2 IMPLANT
SROM M HEAD 36MM PLUS 1.5 (Hips) ×1 IMPLANT
STAPLER VISISTAT 35W (STAPLE) IMPLANT
STRIP CLOSURE SKIN 1/2X4 (GAUZE/BANDAGES/DRESSINGS) IMPLANT
SUT ETHIBOND NAB CT1 #1 30IN (SUTURE) ×2 IMPLANT
SUT ETHILON 2 0 PS N (SUTURE) IMPLANT
SUT MNCRL AB 4-0 PS2 18 (SUTURE) IMPLANT
SUT VIC AB 0 CT1 36 (SUTURE) ×2 IMPLANT
SUT VIC AB 1 CT1 36 (SUTURE) ×2 IMPLANT
SUT VIC AB 2-0 CT1 27 (SUTURE) ×2
SUT VIC AB 2-0 CT1 TAPERPNT 27 (SUTURE) ×4 IMPLANT
TRAY FOLEY MTR SLVR 16FR STAT (SET/KITS/TRAYS/PACK) IMPLANT
YANKAUER SUCT BULB TIP NO VENT (SUCTIONS) ×2 IMPLANT

## 2022-04-01 NOTE — Evaluation (Signed)
Physical Therapy Evaluation Patient Details Name: Manuel CORMAN Sr. MRN: [id number] DOB: 06-18-1941 Today's Date: 04/01/2022  History of Present Illness  Pt is an 81yo male presenting s/p R-THa, AA on 04/01/22. PMH: afib, DM, GERD, HTN, hx of prostate cancer s/p prostatectomy, OSA on CPAP, L-TKA 2009, L-THA 2018, L-reverse total shoulder 2021, R-TKA 2019.   Clinical Impression  Manuel CASSATT Sr. is a 81 y.o. male POD 0 s/p R-THA,AA. Patient reports modified independence using SPC for mobility at baseline. Patient is now limited by functional impairments (see PT problem list below) and requires max assist for bed mobility, scooting up in supine. During initial portion of evaluation, HR range 37-147 and pt has history of afib, MD in room and requesting hold additional PT until tomorrow, further mobility deferred. Patient instructed in exercises to facilitate ROM and circulation to manage edema, pt completed safely with minimal cuing. Provided incentive spirometer and with Vcs pt able to achieve 1586m. Patient will benefit from continued skilled PT interventions to address impairments and progress towards PLOF. Acute PT will follow to progress mobility and stair training in preparation for safe discharge home.       Recommendations for follow up therapy are one component of a multi-disciplinary discharge planning process, led by the attending physician.  Recommendations may be updated based on patient status, additional functional criteria and insurance authorization.  Follow Up Recommendations Follow physician's recommendations for discharge plan and follow up therapies      Assistance Recommended at Discharge Frequent or constant Supervision/Assistance  Patient can return home with the following  A little help with walking and/or transfers;A little help with bathing/dressing/bathroom;Assistance with cooking/housework;Assist for transportation;Help with stairs or ramp for entrance     Equipment Recommendations None recommended by PT  Recommendations for Other Services       Functional Status Assessment Patient has had a recent decline in their functional status and demonstrates the ability to make significant improvements in function in a reasonable and predictable amount of time.     Precautions / Restrictions Precautions Precautions: Fall Restrictions Weight Bearing Restrictions: No Other Position/Activity Restrictions: wbat      Mobility  Bed Mobility Overal bed mobility: Needs Assistance             General bed mobility comments: Pt max-total assist to scoot up in bed. Hr 39-147, Dr. BNinfa Lindenin room and requesting hold PT until tomorrow, Further mobility deferred    Transfers                        Ambulation/Gait                  Stairs            Wheelchair Mobility    Modified Rankin (Stroke Patients Only)       Balance                                             Pertinent Vitals/Pain      Home Living Family/patient expects to be discharged to:: Private residence Living Arrangements: Spouse/significant other Available Help at Discharge: Family;Available 24 hours/day Type of Home: House Home Access: Stairs to enter Entrance Stairs-Rails: None Entrance Stairs-Number of Steps: 2   Home Layout: One level Home Equipment: RConservation officer, nature(2 wheels);Cane - single point;BSC/3in1  Prior Function Prior Level of Function : Independent/Modified Independent             Mobility Comments: SPC ADLs Comments: ind     Hand Dominance   Dominant Hand: Right    Extremity/Trunk Assessment   Upper Extremity Assessment Upper Extremity Assessment: Overall WFL for tasks assessed    Lower Extremity Assessment Lower Extremity Assessment: RLE deficits/detail;LLE deficits/detail RLE Deficits / Details: MMT ank DF/PF 5/5 RLE Sensation: WNL LLE Deficits / Details: MMT ank DF/PF 5/5 LLE  Sensation: WNL    Cervical / Trunk Assessment Cervical / Trunk Assessment: Kyphotic  Communication   Communication: No difficulties  Cognition Arousal/Alertness: Awake/alert Behavior During Therapy: WFL for tasks assessed/performed Overall Cognitive Status: Within Functional Limits for tasks assessed                                          General Comments General comments (skin integrity, edema, etc.): Wife Manuel Hunter Present    Exercises Total Joint Exercises Ankle Circles/Pumps: AROM, Both, 20 reps, Supine Quad Sets: AROM, Both, 5 reps, Supine Gluteal Sets: AROM, Both, 5 reps, Supine   Assessment/Plan    PT Assessment Patient needs continued PT services  PT Problem List Decreased strength;Decreased range of motion;Decreased activity tolerance;Decreased balance;Decreased mobility;Decreased coordination;Pain       PT Treatment Interventions DME instruction;Gait training;Stair training;Functional mobility training;Therapeutic activities;Therapeutic exercise;Balance training;Neuromuscular re-education;Patient/family education;Wheelchair mobility training    PT Goals (Current goals can be found in the Care Plan section)  Acute Rehab PT Goals Patient Stated Goal: To walk without pain PT Goal Formulation: With patient Time For Goal Achievement: 04/08/22 Potential to Achieve Goals: Good    Frequency 7X/week     Co-evaluation               AM-PAC PT "6 Clicks" Mobility  Outcome Measure Help needed turning from your back to your side while in a flat bed without using bedrails?: None Help needed moving from lying on your back to sitting on the side of a flat bed without using bedrails?: A Little Help needed moving to and from a bed to a chair (including a wheelchair)?: A Little Help needed standing up from a chair using your arms (e.g., wheelchair or bedside chair)?: A Little Help needed to walk in hospital room?: A Little Help needed climbing 3-5 steps  with a railing? : A Lot 6 Click Score: 18    End of Session   Activity Tolerance: Treatment limited secondary to medical complications (Comment) (HR afib) Patient left: in bed;with nursing/sitter in room;with family/visitor present;with SCD's reapplied;with bed alarm set;with call bell/phone within reach Nurse Communication: Mobility status;Other (comment) (HR) PT Visit Diagnosis: Pain;Difficulty in walking, not elsewhere classified (R26.2) Pain - Right/Left: Right Pain - part of body: Hip    Time: 6606-3016 PT Time Calculation (min) (ACUTE ONLY): 26 min   Charges:   PT Evaluation $PT Eval Low Complexity: Fletcher, PT, DPT [facility] Office: [phone]  Manuel Hunter 04/01/2022, 4:55 PM

## 2022-04-01 NOTE — Op Note (Signed)
Operative Note  Date of operation: 04/01/2022 Preoperative diagnosis: Right hip osteoarthritis Postoperative diagnosis: Same  Procedure: Right direct anterior total hip arthroplasty  Implants: DePuy Sectra GRIPTION acetabular opponent size 56, size 36+0 neutral polythene liner, size 11 Corail femoral component with high offset, size 36+1.5 metal head ball  Surgeon: Lind Guest. Ninfa Linden, MD Assistant: Benjiman Core, PA-C  Anesthesia: Spinal Antibiotics: 2 g IV Ancef Blood loss: 378 cc Complications: None  Indications: Manuel Hunter is a 81 year old gentleman well-known to me.  We have actually replaced his left hip before and one of his knees.  He has both his knees replaced and a shoulder replacement.  He has developed debilitating right hip pain and x-rays are consistent with osteoarthritis.  He has tried and failed conservative treatment including activity modification, offloading the hip with a walker as well as intra-articular steroid injections.  At this point his hip pain is daily and it is 10 out of 10 with the right hip.  It is detrimentally affecting his mobility, his quality of life, and his actives day living.  At this point we recommended a total hip arthroplasty and he does wish to proceed with this as well given the severe pain he is in.  Having had this before he is fully aware of the risk of acute blood loss anemia, nerve or vessel injury, fracture, infection, DVT, implant failure, dislocation, leg length differences and soft tissue healing issues.  All of these are heightened given his diabetes.  He understands her goals are decreased pain, improve mobility and hopefully improve quality of life.  Procedure description: After informed consent was obtained and appropriate right hip was marked, the patient was brought to the operating room and set up on the operating table where spinal anesthesia was obtained.  He was then laid in supine position a Foley cath was placed in both  feet had traction boots applied to them and a perineal post was then placed with him laid supine on the Hana fracture table.  We then assessed his right hip radiographically.  The right hip was then prepped and draped with DuraPrep and sterile drapes.  A timeout was called and he identified correct patient correct right hip.  I then made incision just inferior and posterior to the anterior superior iliac spine and carried this slightly obliquely down the leg.  We dissect down tensor fascia lata muscle tensor fascia was then divided longitudinally to proceed with directed approach to the hip.  Circumflex vessels were identified and cauterized.  The joint capsule was identified and we opened it up in an L-type format finding a moderate to large joint effusion.  Cobra retractors were placed around the medial lateral femoral neck and a femoral neck cut was made with an oscillating saw just proximal to the lesser trochanter and completed with an osteotome.  A corkscrew guide was placed in the femoral head and the femoral head removed in its entirety and found a wide area devoid of cartilage.  I then placed a bent Hohmann over the medial acetabular rim and remove remnants of the acetabular labrum and other debris.  We then began reaming under direct visualization from a size 43 reamer and stepwise increments going up to a size 55 reamer with all reamers placed under direct visitation in the last reamer was placed under direct fluoroscopy so we can obtain a depth reaming, inclination and anteversion.  We then placed the real DePuy sector GRIPTION acetabular and a size 56 and went with a  36+0 neutral polythene liner for that size acetabular component.  Attention was then turned to the femur.  With the leg externally rotated to 120 degrees, extended and add ducted, the leg was brought over and under.  We then placed a Mueller retractor medially and a Hohmann retractor behind the greater trochanter.  A box cutting osteotome  was used to enter the femoral canal.  We then began broaching using the Corail broaching system going from a size 8 up to a size 11.  With a size 11 in place we trialed I standard offset femoral neck and a 36+1.5 trial hip ball.  This was reduced into the pelvis and we are pleased with his leg length but felt like we needed a little more offset.  It was stable on exam.  We then dislocated the hip and remove the trial components.  We placed the real size 11 femoral component with high offset and went with the real 36+1.5 metal hip ball and again reduce this in the acetabulum and we are pleased with range of motion, offset, leg length and stability assessed both radiographically and mechanically.  The soft tissue was then irrigated with normal saline solution.  The joint capsule was closed with interrupted #1 Ethibond suture followed by #1 Vicryl close the tensor fascia.  0 Vicryl used to close the deep tissue and 2-0 Vicryl used to close subcutaneous tissue.  The skin was closed with staples.  An Aquacel dressing was applied.  The patient was taken off of the Hana table and taken to recovery room in stable condition with all final counts being correct and no complications noted.  Of note Benjiman Core, PA-C assisted during the entire case from beginning to end and his assistance was medically necessary and crucial for soft tissue management and retraction as well as helping guide implant placement and a layered closure of the wound.

## 2022-04-01 NOTE — Anesthesia Postprocedure Evaluation (Signed)
Anesthesia Post Note  Patient: Carlye Grippe Sr.  Procedure(s) Performed: RIGHT TOTAL HIP ARTHROPLASTY ANTERIOR APPROACH (Right: Hip)     Patient location during evaluation: PACU Anesthesia Type: MAC and Spinal Level of consciousness: awake and alert Pain management: pain level controlled Vital Signs Assessment: post-procedure vital signs reviewed and stable Respiratory status: spontaneous breathing and respiratory function stable Cardiovascular status: blood pressure returned to baseline and stable Postop Assessment: spinal receding Anesthetic complications: no   No notable events documented.  Last Vitals:  Vitals:   04/01/22 1100 04/01/22 1115  BP: (!) 103/59 (!) 82/70  Pulse: 90 (!) 105  Resp: (!) 22 (!) 22  Temp:    SpO2:  100%    Last Pain:  Vitals:   04/01/22 1045  PainSc: 0-No pain                 Aeron Lheureux DANIEL

## 2022-04-01 NOTE — Transfer of Care (Signed)
Immediate Anesthesia Transfer of Care Note  Patient: Manuel Grippe Sr.  Procedure(s) Performed: RIGHT TOTAL HIP ARTHROPLASTY ANTERIOR APPROACH (Right: Hip)  Patient Location: PACU  Anesthesia Type:Spinal  Level of Consciousness: drowsy  Airway & Oxygen Therapy: Patient Spontanous Breathing and Patient connected to face mask oxygen  Post-op Assessment: Report given to RN and Post -op Vital signs reviewed and stable  Post vital signs: Reviewed and stable  Last Vitals:  Vitals Value Taken Time  BP 92/55 04/01/22 1026  Temp 36.8 C 04/01/22 1026  Pulse 99 04/01/22 1028  Resp 18 04/01/22 1028  SpO2 97 % 04/01/22 1028  Vitals shown include unvalidated device data.  Last Pain:  Vitals:   04/01/22 0643  PainSc: 4       Patients Stated Pain Goal: 4 (16/10/96 0454)  Complications: No notable events documented.

## 2022-04-01 NOTE — Anesthesia Procedure Notes (Signed)
Procedure Name: MAC Date/Time: 04/01/2022 8:57 AM  Performed by: Niel Hummer, CRNAPre-anesthesia Checklist: Emergency Drugs available, Patient identified, Suction available and Patient being monitored Oxygen Delivery Method: Simple face mask

## 2022-04-01 NOTE — Anesthesia Procedure Notes (Signed)
Spinal  Patient location during procedure: OR Start time: 04/01/2022 8:52 AM End time: 04/01/2022 9:02 AM Reason for block: surgical anesthesia Staffing Performed: anesthesiologist  Anesthesiologist: Duane Boston, MD Performed by: Duane Boston, MD Authorized by: Duane Boston, MD   Preanesthetic Checklist Completed: patient identified, IV checked, risks and benefits discussed, surgical consent, monitors and equipment checked, pre-op evaluation and timeout performed Spinal Block Patient position: sitting Prep: DuraPrep Patient monitoring: cardiac monitor, continuous pulse ox and blood pressure Approach: midline Location: L2-3 Injection technique: single-shot Needle Needle type: Pencan  Needle gauge: 24 G Needle length: 9 cm Assessment Events: CSF return Additional Notes Functioning IV was confirmed and monitors were applied. Sterile prep and drape, including hand hygiene and sterile gloves were used. The patient was positioned and the spine was prepped. The skin was anesthetized with lidocaine.  Free flow of clear CSF was obtained prior to injecting local anesthetic into the CSF.  The spinal needle aspirated freely following injection.  The needle was carefully withdrawn.  The patient tolerated the procedure well.

## 2022-04-01 NOTE — Interval H&P Note (Signed)
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  The risks and benefits of surgery been explained in detail and informed consent is obtained.  The right operative hip has been marked.  04/01/2022 7:05 AM  Carlye Grippe Sr.  has presented today for surgery, with the diagnosis of osteoarthritis right hip.  The various methods of treatment have been discussed with the patient and family. After consideration of risks, benefits and other options for treatment, the patient has consented to  Procedure(s): RIGHT TOTAL HIP ARTHROPLASTY ANTERIOR APPROACH (Right) as a surgical intervention.  The patient's history has been reviewed, patient examined, no change in status, stable for surgery.  I have reviewed the patient's chart and labs.  Questions were answered to the patient's satisfaction.     [name]

## 2022-04-02 ENCOUNTER — Encounter (HOSPITAL_COMMUNITY): Payer: Self-pay | Admitting: Orthopaedic Surgery

## 2022-04-02 DIAGNOSIS — Z8249 Family history of ischemic heart disease and other diseases of the circulatory system: Secondary | ICD-10-CM | POA: Diagnosis not present

## 2022-04-02 DIAGNOSIS — M1611 Unilateral primary osteoarthritis, right hip: Secondary | ICD-10-CM | POA: Diagnosis present

## 2022-04-02 DIAGNOSIS — I10 Essential (primary) hypertension: Secondary | ICD-10-CM | POA: Diagnosis present

## 2022-04-02 DIAGNOSIS — Z8546 Personal history of malignant neoplasm of prostate: Secondary | ICD-10-CM | POA: Diagnosis not present

## 2022-04-02 DIAGNOSIS — Z96651 Presence of right artificial knee joint: Secondary | ICD-10-CM | POA: Diagnosis present

## 2022-04-02 DIAGNOSIS — E669 Obesity, unspecified: Secondary | ICD-10-CM | POA: Diagnosis present

## 2022-04-02 DIAGNOSIS — E119 Type 2 diabetes mellitus without complications: Secondary | ICD-10-CM | POA: Diagnosis present

## 2022-04-02 DIAGNOSIS — I480 Paroxysmal atrial fibrillation: Secondary | ICD-10-CM | POA: Diagnosis not present

## 2022-04-02 DIAGNOSIS — G2581 Restless legs syndrome: Secondary | ICD-10-CM | POA: Diagnosis present

## 2022-04-02 DIAGNOSIS — Z7901 Long term (current) use of anticoagulants: Secondary | ICD-10-CM | POA: Diagnosis not present

## 2022-04-02 DIAGNOSIS — I4891 Unspecified atrial fibrillation: Secondary | ICD-10-CM

## 2022-04-02 DIAGNOSIS — K219 Gastro-esophageal reflux disease without esophagitis: Secondary | ICD-10-CM | POA: Diagnosis present

## 2022-04-02 DIAGNOSIS — I4821 Permanent atrial fibrillation: Secondary | ICD-10-CM | POA: Diagnosis present

## 2022-04-02 DIAGNOSIS — Z96612 Presence of left artificial shoulder joint: Secondary | ICD-10-CM | POA: Diagnosis present

## 2022-04-02 DIAGNOSIS — Z96642 Presence of left artificial hip joint: Secondary | ICD-10-CM | POA: Diagnosis present

## 2022-04-02 DIAGNOSIS — E782 Mixed hyperlipidemia: Secondary | ICD-10-CM | POA: Diagnosis present

## 2022-04-02 DIAGNOSIS — Z79899 Other long term (current) drug therapy: Secondary | ICD-10-CM | POA: Diagnosis not present

## 2022-04-02 LAB — CBC
HCT: 37.4 % — ABNORMAL LOW (ref 39.0–52.0)
Hemoglobin: 12.6 g/dL — ABNORMAL LOW (ref 13.0–17.0)
MCH: 33.2 pg (ref 26.0–34.0)
MCHC: 33.7 g/dL (ref 30.0–36.0)
MCV: 98.7 fL (ref 80.0–100.0)
Platelets: 272 10*3/uL (ref 150–400)
RBC: 3.79 MIL/uL — ABNORMAL LOW (ref 4.22–5.81)
RDW: 13.4 % (ref 11.5–15.5)
WBC: 18.8 10*3/uL — ABNORMAL HIGH (ref 4.0–10.5)
nRBC: 0 % (ref 0.0–0.2)

## 2022-04-02 LAB — BASIC METABOLIC PANEL
Anion gap: 7 (ref 5–15)
BUN: 19 mg/dL (ref 8–23)
CO2: 27 mmol/L (ref 22–32)
Calcium: 8.6 mg/dL — ABNORMAL LOW (ref 8.9–10.3)
Chloride: 105 mmol/L (ref 98–111)
Creatinine, Ser: 0.88 mg/dL (ref 0.61–1.24)
GFR, Estimated: >60 mL/min (ref >60)
Glucose, Bld: 168 mg/dL — ABNORMAL HIGH (ref 70–99)
Potassium: 4.4 mmol/L (ref 3.5–5.1)
Sodium: 139 mmol/L (ref 135–145)

## 2022-04-02 LAB — GLUCOSE, CAPILLARY
Glucose-Capillary: 172 mg/dL — ABNORMAL HIGH (ref 70–99)
Glucose-Capillary: 164 mg/dL — ABNORMAL HIGH (ref 70–99)
Glucose-Capillary: 175 mg/dL — ABNORMAL HIGH (ref 70–99)
Glucose-Capillary: 170 mg/dL — ABNORMAL HIGH (ref 70–99)

## 2022-04-02 MED ORDER — APIXABAN 5 MG PO TABS: 5.0000 mg | ORAL_TABLET | Freq: Two times a day (BID) | ORAL | 1 refills | Status: DC

## 2022-04-02 MED ORDER — APIXABAN 5 MG PO TABS
5.0000 mg | ORAL_TABLET | Freq: Two times a day (BID) | ORAL | Status: DC
  Administered 2022-04-02 – 2022-04-08 (×12): 5 mg via ORAL
  Filled 2022-04-02 (×12): qty 1

## 2022-04-02 MED ORDER — DILTIAZEM HCL 25 MG/5ML IV SOLN
15.0000 mg | Freq: Once | INTRAVENOUS | Status: AC
  Administered 2022-04-02: 15 mg via INTRAVENOUS
  Filled 2022-04-02: qty 5

## 2022-04-02 MED ORDER — DILTIAZEM HCL 30 MG PO TABS
30.0000 mg | ORAL_TABLET | Freq: Once | ORAL | Status: AC
  Administered 2022-04-02: 30 mg via ORAL
  Filled 2022-04-02: qty 1

## 2022-04-02 MED ORDER — DILTIAZEM HCL ER COATED BEADS 180 MG PO CP24
180.0000 mg | ORAL_CAPSULE | Freq: Every day | ORAL | Status: DC
  Filled 2022-04-02: qty 1

## 2022-04-02 MED ORDER — OXYCODONE HCL 5 MG PO TABS: 5.0000 mg | ORAL_TABLET | ORAL | 0 refills | Status: DC | PRN

## 2022-04-02 NOTE — Progress Notes (Signed)
04/02/22 1500  PT Visit Information  Last PT Received On 04/02/22  Assistance Needed +1  Assisted pt back to bed. Limited to transfers only d/t elevted HR although improved from am. HR 109-143 max with mobility, remains in afib. Cardiology consult pending. Will continue to follow and progress as able per ortho/cards recommendations   History of Present Illness Pt is an 81yo male presenting s/p R-THa, AA on 04/01/22. PMH: afib, DM, GERD, HTN, hx of prostate cancer s/p prostatectomy, OSA on CPAP, L-TKA 2009, L-THA 2018, L-reverse total shoulder 2021, R-TKA 2019.  Subjective Data  Patient Stated Goal To walk without pain  Precautions  Precautions Fall  Restrictions  Weight Bearing Restrictions No  Other Position/Activity Restrictions wbat  Pain Assessment  Pain Assessment Faces  Faces Pain Scale 6  Pain Location right hip  Pain Descriptors / Indicators Grimacing;Guarding;Burning;Tightness  Pain Intervention(s) Limited activity within patient's tolerance;Monitored during session;Premedicated before session;Repositioned  Cognition  Arousal/Alertness Awake/alert  Behavior During Therapy WFL for tasks assessed/performed  Overall Cognitive Status Within Functional Limits for tasks assessed  Bed Mobility  Overal bed mobility Needs Assistance  Bed Mobility Sit to Supine  Sit to supine Mod assist;Max assist  General bed mobility comments incr time, cues for breathing, sequence and self assist. assist needed to progress RLE on to  bed  Transfers  Overall transfer level Needs assistance  Equipment used Rolling walker (2 wheels)  Transfers Sit to/from Stand;Bed to chair/wheelchair/BSC  Sit to Stand Min assist;From elevated surface  Bed to/from chair/wheelchair/BSC transfer type: Step pivot  Step pivot transfers Min assist;Mod assist  General transfer comment cues for hand placement, assist with anterior superior wt tranfer and to control descent as well as to extend RLE for improved pain  control.  Ambulation/Gait  General Gait Details transfers only d/t incr HR  Total Joint Exercises  Ankle Circles/Pumps AROM;Both;10 reps  PT - End of Session  Equipment Utilized During Treatment Gait belt  Activity Tolerance Patient tolerated treatment well;Treatment limited secondary to medical complications (Comment)  Patient left in chair;with call bell/phone within reach;with chair alarm set;with family/visitor present   PT - Assessment/Plan  PT Plan Current plan remains appropriate  PT Visit Diagnosis Pain;Difficulty in walking, not elsewhere classified (R26.2)  Pain - Right/Left Right  Pain - part of body Hip  PT Frequency (ACUTE ONLY) 7X/week  Follow Up Recommendations Follow physician's recommendations for discharge plan and follow up therapies  Assistance recommended at discharge Frequent or constant Supervision/Assistance  Patient can return home with the following A little help with walking and/or transfers;A little help with bathing/dressing/bathroom;Assistance with cooking/housework;Assist for transportation;Help with stairs or ramp for entrance  PT equipment None recommended by PT  AM-PAC PT "6 Clicks" Mobility Outcome Measure (Version 2)  Help needed turning from your back to your side while in a flat bed without using bedrails? 2  Help needed moving from lying on your back to sitting on the side of a flat bed without using bedrails? 2  Help needed moving to and from a bed to a chair (including a wheelchair)? 2  Help needed standing up from a chair using your arms (e.g., wheelchair or bedside chair)? 2  Help needed to walk in hospital room? 2  Help needed climbing 3-5 steps with a railing?  2  6 Click Score 12  Consider Recommendation of Discharge To: CIR/SNF/LTACH  Progressive Mobility  What is the highest level of mobility based on the progressive mobility assessment? Level 3 (Stands with assist) -  Balance while standing  and cannot march in place  Activity Transferred  from chair to bed  PT Goal Progression  Progress towards PT goals Progressing toward goals  Acute Rehab PT Goals  PT Goal Formulation With patient  Time For Goal Achievement 04/08/22  Potential to Achieve Goals Good  PT Time Calculation  PT Start Time (ACUTE ONLY) 1353  PT Stop Time (ACUTE ONLY) 1418  PT Time Calculation (min) (ACUTE ONLY) 25 min  PT General Charges  $$ ACUTE PT VISIT 1 Visit  PT Treatments  $Therapeutic Activity 23-37 mins

## 2022-04-02 NOTE — Progress Notes (Signed)
Patient ID: Manuel Grippe Sr., male   DOB: 1941/02/07, 81 y.o.   MRN: [id number] Manuel Hunter is awake and alert this morning.  He denies any chest pain or shortness of breath.  However he is in A-fib with RVR.  He does have a history of A-fib.  He is on Cardizem.  2 weeks ago he did have an episode of RVR and then this was controlled.  He sees cardiologist in [facility].  Surgery was uneventful yesterday.  He was not in A-fib with RVR in the preoperative area and anesthesia was comfortable with surgery under spinal anesthesia.  Again, his heart rate seem to be controlled.  However, postoperatively, his heart rate continues to remain high and an EKG today shows A-fib with RVR.  I have consulted Dr. Debara Pickett with cardiology here for further evaluation and treatment.  I did explain to Manuel Hunter that we need to delay therapy until his heart rate is under control.  He is on Eliquis.  His blood pressure is stable.  His postoperative hemoglobin is 12.6.  Once his heart rate is controlled, therapy can start working on his mobility.  I certainly appreciate Dr. Debara Pickett agreed to come by and seeing my patient.

## 2022-04-02 NOTE — Progress Notes (Signed)
EKG obtained. Results: A.Fib. Results put in patient's chart.

## 2022-04-02 NOTE — TOC Transition Note (Signed)
Transition of Care [facility]) - CM/SW Discharge Note   Patient Details  Name: Manuel STUCK Sr. MRN: [id number] Date of Birth: 03/12/1941  [facility]) CM/SW Contact:  Ross Ludwig, LCSW Phone Number: 04/02/2022, 10:36 AM   Clinical Narrative:     Patient has home health PT prearranged through [place].  Plan to return back home with home health.  Patient has a rolling walker already at home.  CSW signing off, please reconsult if TOC needs arise.   Final next level of care: [place] Barriers to Discharge: Continued Medical Work up   Patient Goals and CMS Choice Patient states their goals for this hospitalization and ongoing recovery are:: To go home with home health. CMS Medicare.gov Compare Post Acute Care list provided to:: Patient Represenative (must comment) Choice offered to / list presented to : Spouse  Discharge Placement                       Discharge Plan and Services                          HH Arranged: PT [place] Agency: [place]        Social Determinants of Health (SDOH) Interventions     Readmission Risk Interventions     No data to display

## 2022-04-02 NOTE — Progress Notes (Signed)
Paged Dr. Debara Pickett, MD to get an update on when cardiology is coming to see the patient. Was told Dr. Viviann Spare, MD would be coming to see the patient. Both Dr. Debara Pickett, MD and Dr. Domenic Polite, MD were made aware of patient's heart rate being in the 130's-140's range. Waiting for further orders. Will continue to monitor patient.

## 2022-04-02 NOTE — Plan of Care (Signed)
  Problem: Education: Goal: Knowledge of the prescribed therapeutic regimen will improve Outcome: Progressing   Problem: Activity: Goal: Ability to tolerate increased activity will improve Outcome: Progressing   Problem: Pain Management: Goal: Pain level will decrease with appropriate interventions Outcome: Progressing   Problem: Safety: Goal: Ability to remain free from injury will improve Outcome: Progressing   

## 2022-04-02 NOTE — Discharge Instructions (Signed)

## 2022-04-02 NOTE — Progress Notes (Signed)
Physical Therapy Treatment Patient Details Name: Manuel CHAIRES Sr. MRN: [id number] DOB: 02/27/1941 Today's Date: 04/02/2022   History of Present Illness Pt is an 81yo male presenting s/p R-THa, AA on 04/01/22. PMH: afib, DM, GERD, HTN, hx of prostate cancer s/p prostatectomy, OSA on CPAP, L-TKA 2009, L-THA 2018, L-reverse total shoulder 2021, R-TKA 2019.    PT Comments    Pt remains tachy/afib. Discussed activity with MD, worked on transfers only, mobility limited d/t elevated HR.  Pt tends to hold his breath and talk excessively which further increases HR  HR  in the 100s-110s at rest with max HR of 178 during stand-step bed to chair transfer.  Deferred exercise or further activity d/t elevated HR    Recommendations for follow up therapy are one component of a multi-disciplinary discharge planning process, led by the attending physician.  Recommendations may be updated based on patient status, additional functional criteria and insurance authorization.  Follow Up Recommendations  Follow physician's recommendations for discharge plan and follow up therapies     Assistance Recommended at Discharge Frequent or constant Supervision/Assistance  Patient can return home with the following A little help with walking and/or transfers;A little help with bathing/dressing/bathroom;Assistance with cooking/housework;Assist for transportation;Help with stairs or ramp for entrance   Equipment Recommendations  None recommended by PT    Recommendations for Other Services       Precautions / Restrictions Precautions Precautions: Fall Restrictions Weight Bearing Restrictions: No Other Position/Activity Restrictions: wbat     Mobility  Bed Mobility Overal bed mobility: Needs Assistance Bed Mobility: Supine to Sit     Supine to sit: Mod assist, HOB elevated     General bed mobility comments: incr time, cues for breathing, sequence and self assist. assist needed to progress RLE off bed and  elevate trunk; rest breaks given d/t elevated HR ( pt sleeps in recliner at home    Transfers Overall transfer level: Needs assistance Equipment used: Rolling walker (2 wheels) Transfers: Sit to/from Stand Sit to Stand: Min assist, From elevated surface Stand step pivot bed to chair, activity limited by PT d/t elevated HR            General transfer comment: bed ht elevated to decr pt effort required. cues for hand placement, assist with anterior superior wt tranfer and to control descent as well as to extend RLE for improved pain control.    Ambulation/Gait                   Stairs             Wheelchair Mobility    Modified Rankin (Stroke Patients Only)       Balance                                            Cognition Arousal/Alertness: Awake/alert Behavior During Therapy: WFL for tasks assessed/performed Overall Cognitive Status: Within Functional Limits for tasks assessed                                          Exercises Total Joint Exercises Ankle Circles/Pumps: AROM, Both, 10 reps    General Comments        Pertinent Vitals/Pain Pain Assessment Pain Assessment: Faces Faces Pain Scale: Hurts whole lot  Pain Location: right hip Pain Descriptors / Indicators: Grimacing, Guarding, Burning, Tightness Pain Intervention(s): Limited activity within patient's tolerance, Monitored during session, Premedicated before session, Repositioned    Home Living                          Prior Function            PT Goals (current goals can now be found in the care plan section) Acute Rehab PT Goals Patient Stated Goal: To walk without pain PT Goal Formulation: With patient Time For Goal Achievement: 04/08/22 Potential to Achieve Goals: Good Progress towards PT goals: Progressing toward goals    Frequency    7X/week      PT Plan Current plan remains appropriate    Co-evaluation               AM-PAC PT "6 Clicks" Mobility   Outcome Measure  Help needed turning from your back to your side while in a flat bed without using bedrails?: A Lot Help needed moving from lying on your back to sitting on the side of a flat bed without using bedrails?: A Lot Help needed moving to and from a bed to a chair (including a wheelchair)?: A Lot Help needed standing up from a chair using your arms (e.g., wheelchair or bedside chair)?: A Lot Help needed to walk in hospital room?: A Lot Help needed climbing 3-5 steps with a railing? : A Lot 6 Click Score: 12    End of Session Equipment Utilized During Treatment: Gait belt Activity Tolerance: Patient tolerated treatment well;Treatment limited secondary to medical complications (Comment) Patient left: in chair;with call bell/phone within reach;with chair alarm set;with family/visitor present   PT Visit Diagnosis: Pain;Difficulty in walking, not elsewhere classified (R26.2) Pain - Right/Left: Right Pain - part of body: Hip     Time: 3790-2409 PT Time Calculation (min) (ACUTE ONLY): 23 min  Charges:  $Therapeutic Activity: 23-37 mins                     Baxter Flattery, PT  Acute Rehab Dept [facility]) [phone]  [facility] Weekend Pager (Saturday/Sunday only)  [phone]  04/02/2022    [facility] 04/02/2022, 1:30 PM

## 2022-04-02 NOTE — Progress Notes (Deleted)
   04/02/22 0800  Assess: MEWS Score  Temp 97.8 F (36.6 C)  BP (!) 147/101  Pulse Rate (!) 131  Resp 18  Level of Consciousness Alert  SpO2 99 %  Assess: MEWS Score  MEWS Temp 0  MEWS Systolic 0  MEWS Pulse 3  MEWS RR 0  MEWS LOC 0  MEWS Score 3  MEWS Score Color Yellow  Assess: if the MEWS score is Yellow or Red  Were vital signs taken at a resting state? Yes  Focused Assessment Change from prior assessment (see assessment flowsheet)  Does the patient meet 2 or more of the SIRS criteria? Yes  Does the patient have a confirmed or suspected source of infection? No  MEWS guidelines implemented *See Row Information* Yes  Take Vital Signs  Increase Vital Sign Frequency  Yellow: Q 2hr X 2 then Q 4hr X 2, if remains yellow, continue Q 4hrs  Escalate  MEWS: Escalate Yellow: discuss with charge nurse/RN and consider discussing with provider and RRT  Notify: Charge Nurse/RN  Name of Charge Nurse/RN Notified Geoffry Paradise, RN  Date Charge Nurse/RN Notified 04/02/22  Time Charge Nurse/RN Notified [phone]  Notify: Provider  Provider Name/Title Dr. Jean Rosenthal, MD  Date Provider Notified 04/02/22  Time Provider Notified [phone]  Method of Notification Call  Notification Reason Change in status (Patient in A.Fib and sinus tach)  Provider response Other (Comment) (Obtain EKG and Dr. Ninfa Linden, MD stated he would consult cardiology.)  Date of Provider Response 04/02/22  Time of Provider Response [phone]  Notify: Rapid Response  Name of Rapid Response RN Notified  (Patient alert and oriented, not complaining of chest pain, and not in any distress. Rapid response not needed at this time.)  Date Rapid Response Notified  (Patient alert and oriented, not complaining of chest pain, and not in any distress. Rapid response not needed at this time.)  Time Rapid Response Notified  (Patient alert and oriented, not complaining of chest pain, and not in any distress. Rapid response not needed at  this time.)  Assess: SIRS CRITERIA  SIRS Temperature  0  SIRS Pulse 1  SIRS Respirations  0  SIRS WBC 1  SIRS Score Sum  2

## 2022-04-02 NOTE — Progress Notes (Signed)
Pt continues with Afib RVR ranging 130s-160s; scheduled po cardizem '30mg'$  given. Pt continue to deny chest pain or palpitations. Cardiology paged with return call, no new orders and will reassess in the morning.

## 2022-04-02 NOTE — Plan of Care (Signed)
  Problem: Education: Goal: Knowledge of General Education information will improve Description Including pain rating scale, medication(s)/side effects and non-pharmacologic comfort measures Outcome: Progressing   

## 2022-04-02 NOTE — Consult Note (Addendum)
Cardiology Consultation:   Patient ID: Manuel Manuel Sr.; [id number]; 1940-12-13   Admit date: 04/01/2022 Date of Consult: 04/02/2022  Primary Care Provider: [facility] Primary Cardiologist: Franchot Mimes, MD Primary Electrophysiologist: None   Patient Profile:  2 Manuel Manuel Sr. is an 81 y.o. male with a history of permanent atrial fibrillation present over the last several months and followed by Dr. Claudie Leach, type 2 diabetes mellitus, hypertension, and GERD who is being seen today for the evaluation of atrial fibrillation with RVR at the request of Dr. Ninfa Linden.  History of Present Illness:   Mr. Manuel Hunter is currently postop day 2 status post right total hip arthroplasty under spinal anesthesia per Dr. Ninfa Linden.  Cardiology requested to evaluate due to rapid atrial fibrillation.  I reviewed his telemetry and discussed his history with patient and family members present including his wife and his daughter.  He sees Dr. Claudie Leach for diagnosis of what looks to be permanent atrial fibrillation present over the last several months.  CHA2DS2-VASc score is at least 4 and he has been on Eliquis for stroke prophylaxis as an outpatient.  My understanding is that Cardizem CD 120 mg daily was a more recent addition to his therapy within the last few weeks for heart rate control.  He held Eliquis 72 hours prior to operation yesterday, it also looks like he did not receive a dose of Cardizem CD yesterday.  Heart rate has been elevated since September 15, today up into the 140s this afternoon although he did get Cardizem CD 120 mg earlier today.  He does not report any sense of palpitations, no breathlessness or chest pain.  Otherwise hemodynamically stable and somewhat hypertensive.  Currently in no unusual degree of pain following surgery, afebrile and just mildly anemic at 12.6.  Past Medical History:  Diagnosis Date   Arthritis    Atrial fibrillation (HCC)    GERD  (gastroesophageal reflux disease)    History of hiatal hernia    Hypertension    Lower extremity edema    Occasionally takes furosemide prn   Prostate cancer ([place]) 2007   tx with surgery   RLS (restless legs syndrome)    Rotator cuff tear    Right   Sleep apnea    CPAP nightly   Type 2 diabetes mellitus ([place])     Past Surgical History:  Procedure Laterality Date   FINGER ARTHROPLASTY Left 03/10/2017   Procedure: Left index finger metacarpal phalangeal arthroplasty;  Surgeon: Roseanne Kaufman, MD;  Location: [facility];  Service: Orthopedics;  Laterality: Left;  90 mins   JOINT REPLACEMENT     KNEE SURGERY Bilateral    left knee replacment  2009   MANDIBLE FRACTURE SURGERY     left 2 titanium plates and 10 scres   NECK SURGERY     cervical with bone graft   PROSTATE SURGERY     prostatctomy   REVERSE SHOULDER ARTHROPLASTY Left 02/14/2020   Procedure: REVERSE SHOULDER ARTHROPLASTY;  Surgeon: Netta Cedars, MD;  Location: [facility];  Service: Orthopedics;  Laterality: Left;  Interscalene block   ROTATOR CUFF REPAIR     x 2 right x 2 left   TOTAL HIP ARTHROPLASTY Left 09/13/2016   Procedure: LEFT TOTAL HIP ARTHROPLASTY ANTERIOR APPROACH;  Surgeon: [name], MD;  Location: [facility];  Service: Orthopedics;  Laterality: Left;   TOTAL KNEE ARTHROPLASTY Right 03/23/2018   Procedure: RIGHT TOTAL KNEE ARTHROPLASTY;  Surgeon: [name], MD;  Location: [facility];  Service: Orthopedics;  Laterality: Right;     Inpatient Medications: Scheduled Meds:  apixaban  2.5 mg Oral BID   aspirin  81 mg Oral Daily   atorvastatin  10 mg Oral QHS   diltiazem  120 mg Oral Daily   diltiazem  15 mg Intravenous Once   docusate sodium  100 mg Oral BID   gabapentin  200 mg Oral QHS   insulin aspart  0-15 Units Subcutaneous TID WC   insulin aspart  0-5 Units Subcutaneous QHS   linagliptin  5 mg Oral Daily   And   metFORMIN  1,000 mg Oral Q breakfast   pantoprazole  40 mg  Oral Daily   phenytoin  200 mg Oral QHS   rOPINIRole  1 mg Oral TID WC   rOPINIRole  3 mg Oral QHS   [START ON 04/04/2022] Vitamin D (Ergocalciferol)  50,000 Units Oral Q Mon   Continuous Infusions:  sodium chloride 75 mL/hr at 04/01/22 1501   methocarbamol (ROBAXIN) IV     PRN Meds: acetaminophen, alum & mag hydroxide-simeth, diphenhydrAMINE, HYDROmorphone (DILAUDID) injection, HYDROmorphone, menthol-cetylpyridinium **OR** phenol, methocarbamol **OR** methocarbamol (ROBAXIN) IV, metoCLOPramide **OR** metoCLOPramide (REGLAN) injection, ondansetron **OR** ondansetron (ZOFRAN) IV, oxyCODONE, polyethylene glycol  Allergies:    Allergies  Allergen Reactions   Morphine Nausea And Vomiting    Social History:   Social History   Tobacco Use   Smoking status: Never   Smokeless tobacco: Never  Substance Use Topics   Alcohol use: Never     Family History:   The patient's family history includes Hypertension in his father.  ROS:  Please see the history of present illness.  All other ROS reviewed and negative.     Physical Exam/Data:   Vitals:   04/02/22 0524 04/02/22 0800 04/02/22 1015 04/02/22 1315  BP: 128/86 (!) 147/101 116/65 136/86  Pulse: (!) 109 (!) 131 (!) 104 (!) 127  Resp: '18 18 18 '$ (!) 22  Temp: 97.6 F (36.4 C) 97.8 F (36.6 C) 98.2 F (36.8 C) 99.2 F (37.3 C)  TempSrc: Oral Oral Oral Axillary  SpO2: 99% 99% 95% 93%  Weight:      Height:        Intake/Output Summary (Last 24 hours) at 04/02/2022 1459 Last data filed at 04/02/2022 1415 Gross per 24 hour  Intake 2008.89 ml  Output 3025 ml  Net -1016.11 ml   Filed Weights   04/01/22 0643  Weight: 107.5 kg   Body mass index is 38.25 kg/m.   Gen: Patient appears comfortable at rest. HEENT: Conjunctiva and lids normal, oropharynx clear. Neck: Supple, no elevated JVP or carotid bruits, no thyromegaly. Lungs: Clear to auscultation, nonlabored breathing at rest. Cardiac: Irregularly irregular, no S3. 1/6  systolic murmur, no pericardial rub. Abdomen: Soft, nontender, bowel sounds present. Extremities: Compression stockings bilaterally. Skin: Warm and dry. Musculoskeletal: No kyphosis. Neuropsychiatric: Alert and oriented x3, affect grossly appropriate.  EKG:  An ECG dated 04/02/2022 was personally reviewed today and demonstrated:  Atrial fibrillation with RVR, heart rate in the 120s.  Telemetry:  I personally reviewed telemetry which shows atrial fibrillation with RVR.  Relevant CV Studies:  No outside cardiac testing is available for review today.  Laboratory Data:  Chemistry Recent Labs  Lab 04/02/22 0328  NA 139  K 4.4  CL 105  CO2 27  GLUCOSE 168*  BUN 19  CREATININE 0.88  CALCIUM 8.6*  GFRNONAA >60  ANIONGAP 7     Hematology Recent  Labs  Lab 04/02/22 0328  WBC 18.8*  RBC 3.79*  HGB 12.6*  HCT 37.4*  MCV 98.7  MCH 33.2  MCHC 33.7  RDW 13.4  PLT 272   Cardiac Enzymes Recent Labs  Lab 03/14/22 1729  TROPONINIHS 12   Radiology/Studies:  DG Pelvis Portable  Result Date: 04/01/2022 CLINICAL DATA:  Postop right hip replacement EXAM: PORTABLE PELVIS 1-2 VIEWS COMPARISON:  Pelvis 05/08/2017 FINDINGS: Right hip replacement in satisfactory position and alignment. No fracture or complication. Pre-existing left hip replacement without complication Surgical clips in the pelvis.  Penile implant. IMPRESSION: Satisfactory right hip replacement Electronically Signed   By: Franchot Gallo M.D.   On: 04/01/2022 11:03   DG HIP UNILAT WITH PELVIS 1V RIGHT  Result Date: 04/01/2022 CLINICAL DATA:  Right hip replacement. EXAM: DG HIP (WITH OR WITHOUT PELVIS) 1V RIGHT; DG C-ARM 1-60 MIN-NO REPORT Radiation exposure index: 4.427 mGy. COMPARISON:  None Available. FINDINGS: Three intraoperative fluoroscopic images were obtained of the right hip. The right femoral and acetabular components are well situated. IMPRESSION: Fluoroscopic guidance provided during right total hip  arthroplasty. Electronically Signed   By: Marijo Conception M.D.   On: 04/01/2022 10:16   DG C-Arm 1-60 Min-No Report  Result Date: 04/01/2022 Fluoroscopy was utilized by the requesting physician.  No radiographic interpretation.   DG C-Arm 1-60 Min-No Report  Result Date: 04/01/2022 Fluoroscopy was utilized by the requesting physician.  No radiographic interpretation.    Assessment and Plan:   1.  Permanent atrial fibrillation with CHA2DS2-VASc score of 4, currently with RVR in the postoperative setting.  He did not receive regular dose of Cardizem CD 120 mg daily yesterday which is likely a contributor.  Afebrile and only mildly anemic, no unusual degree of pain.  He was off Eliquis 72 hours prior to surgery, now resumed.  He is asymptomatic in terms of palpitations.  2.  Postop day 2 status post right hip arthroplasty under spinal anesthesia.  3.  Essential hypertension.  4.  Mixed hyperlipidemia, on Lipitor.  5.  Restless leg syndrome, currently on phenytoin.  Discussed with patient, his wife and daughter present.  Also reviewed situation with nursing and the pharmacist.  Plan to give a 15 mg dose of diltiazem IV and follow that with an oral short acting 30 mg dose if necessary with subsequent up titration of long-acting Cardizem tomorrow to 180 mg daily.  I do not feel strongly that he needs to be converted to intravenous diltiazem infusion, he could be given an additional oral dose of diltiazem 30 mg later this evening or early morning if necessary pending up titration of long-acting dose tomorrow.  Of note, pharmacist brought up a good point of patient being on phenytoin and metabolism interaction with DOACs.  Since he is on phenytoin for restless leg syndrome and not seizures, I would suggest stopping this medication and considering a switch to a different agent as an outpatient that would be more appropriate to use in the setting of continuing Eliquis.  He also needs to be on Eliquis 5  mg twice daily which will be adjusted.  Signed, Rozann Lesches, MD  04/02/2022 2:59 PM

## 2022-04-02 NOTE — Progress Notes (Signed)
Patient ID: Manuel Grippe Sr., male   DOB: 11/21/1940, 81 y.o.   MRN: [id number] I really appreciate Dr. Domenic Polite, cardiologist, coming by and seeing my patient as a consult.  The patient is currently eating and comfortable at the bedside in terms of no chest pain or shortness of breath.  He is having obvious postoperative pain from the hip itself.  His heart rate is still little high.  They are working on bringing that down.  This will likely keep him here a day or 2 extra.  I explained to him that discharge would not be reasonable until his A-fib with RVR was under control.  I did see the recommendations about Eliquis as well as the Dilantin.  I talked to the patient about this as well.

## 2022-04-02 NOTE — Progress Notes (Signed)
   04/02/22 0800  Assess: MEWS Score  Temp 97.8 F (36.6 C)  BP (!) 147/101  Pulse Rate (!) 131  Resp 18  Level of Consciousness Alert  SpO2 99 %  Assess: MEWS Score  MEWS Temp 0  MEWS Systolic 0  MEWS Pulse 3  MEWS RR 0  MEWS LOC 0  MEWS Score 3  MEWS Score Color Yellow  Assess: if the MEWS score is Yellow or Red  Were vital signs taken at a resting state? Yes  Focused Assessment Change from prior assessment (see assessment flowsheet)  Does the patient meet 2 or more of the SIRS criteria? Yes  Does the patient have a confirmed or suspected source of infection? No  MEWS guidelines implemented *See Row Information* Yes  Treat  MEWS Interventions Other (Comment) (Called Dr. Jean Rosenthal, MD. Dr. Ninfa Linden then gave verbal order to obtain EKG. Dr. Ninfa Linden, MD also put in a consult to cardiology.)  Take Vital Signs  Increase Vital Sign Frequency  Yellow: Q 2hr X 2 then Q 4hr X 2, if remains yellow, continue Q 4hrs  Escalate  MEWS: Escalate Yellow: discuss with charge nurse/RN and consider discussing with provider and RRT  Notify: Charge Nurse/RN  Name of Charge Nurse/RN Notified Geoffry Paradise, RN  Date Charge Nurse/RN Notified 04/02/22  Time Charge Nurse/RN Notified [phone]  Notify: Provider  Provider Name/Title Dr. Jean Rosenthal, MD  Date Provider Notified 04/02/22  Time Provider Notified [phone]  Method of Notification Call  Notification Reason Change in status (Patient in A.Fib and sinus tach)  Provider response Other (Comment) (Obtain EKG and Dr. Ninfa Linden, MD stated he would consult cardiology.)  Date of Provider Response 04/02/22  Time of Provider Response [phone]  Notify: Rapid Response  Name of Rapid Response RN Notified  (Patient alert and oriented, not complaining of chest pain, and not in any distress. Rapid response not needed at this time.)  Date Rapid Response Notified  (Patient alert and oriented, not complaining of chest pain, and not in any  distress. Rapid response not needed at this time.)  Time Rapid Response Notified  (Patient alert and oriented, not complaining of chest pain, and not in any distress. Rapid response not needed at this time.)  Assess: SIRS CRITERIA  SIRS Temperature  0  SIRS Pulse 1  SIRS Respirations  0  SIRS WBC 1  SIRS Score Sum  2

## 2022-04-02 NOTE — Progress Notes (Signed)
MEWS score now green. Patient in and out of A.Fib. and sinus tach.

## 2022-04-03 ENCOUNTER — Encounter (HOSPITAL_COMMUNITY): Payer: Self-pay | Admitting: Orthopaedic Surgery

## 2022-04-03 DIAGNOSIS — I4891 Unspecified atrial fibrillation: Secondary | ICD-10-CM | POA: Diagnosis not present

## 2022-04-03 LAB — GLUCOSE, CAPILLARY
Glucose-Capillary: 161 mg/dL — ABNORMAL HIGH (ref 70–99)
Glucose-Capillary: 167 mg/dL — ABNORMAL HIGH (ref 70–99)
Glucose-Capillary: 179 mg/dL — ABNORMAL HIGH (ref 70–99)
Glucose-Capillary: 202 mg/dL — ABNORMAL HIGH (ref 70–99)

## 2022-04-03 MED ORDER — APIXABAN 5 MG PO TABS: 5.0000 mg | ORAL_TABLET | Freq: Two times a day (BID) | ORAL | 1 refills | Status: AC

## 2022-04-03 MED ORDER — DILTIAZEM HCL ER COATED BEADS 240 MG PO CP24
240.0000 mg | ORAL_CAPSULE | Freq: Every day | ORAL | Status: DC
  Administered 2022-04-03 – 2022-04-08 (×6): 240 mg via ORAL
  Filled 2022-04-03 (×6): qty 1

## 2022-04-03 MED ORDER — OXYCODONE HCL 5 MG PO TABS: 5.0000 mg | ORAL_TABLET | ORAL | 0 refills | Status: DC | PRN

## 2022-04-03 NOTE — Progress Notes (Signed)
During morning assessment, Dr. Rozann Lesches, MD was rounding on patient. Dr. Rozann Lesches, MD increased patient's Cardizem dose to 240 mg. About 30 minutes after the Cardizem med was given, patient needed to use urinal. While patient was standing up on the side of the bed to use urinal, received call from tele saying that patient's heart rate jumped up to the 160's. They stated that heart rate was in the 180's briefly and then fell back down to the 160's. Dr. Domenic Polite, MD has been made aware of this info. During shift assessment, Dr. Domenic Polite, MD stated to give time for the increased dose of Cardizem med to work and then PT could work with him. PT will be doing some light exercises with patient when Cardizem med has taken more time to take effect. Will continue to monitor patient.

## 2022-04-03 NOTE — Progress Notes (Signed)
   04/03/22 1700  PT Visit Information  Last PT Received On 04/03/22  Assistance Needed Pt tolerated THA HEP with multiple rest breaks. HR max 145 with resting HR 120s-130s/afib.  Will continue efforts to progress as able.     History of Present Illness Pt is an 81yo male presenting s/p R-THA, AA on 04/01/22.  pt with afib post op/rate poorly controlled and cardiology consulted. PMH: afib, DM, GERD, HTN, hx of prostate cancer s/p prostatectomy, OSA on CPAP, L-TKA 2009, L-THA 2018, L-reverse total shoulder 2021, R-TKA 2019.  Subjective Data  Patient Stated Goal To walk without pain  Precautions  Precautions Fall  Restrictions  Weight Bearing Restrictions No  Other Position/Activity Restrictions wbat  Pain Assessment  Pain Assessment Faces  Faces Pain Scale 4  Pain Location right hip  Pain Descriptors / Indicators Grimacing;Guarding;Burning;Tightness  Pain Intervention(s) Limited activity within patient's tolerance;Monitored during session;Repositioned;Premedicated before session  Cognition  Arousal/Alertness Awake/alert  Behavior During Therapy [facility] for tasks assessed/performed  Overall Cognitive Status Within Functional Limits for tasks assessed  Total Joint Exercises  Ankle Circles/Pumps AROM;Both;10 reps  Quad Sets AROM;Both;10 reps  Gluteal Sets AROM;Both;Supine;10 reps  Short [place] [employer];Right;10 reps  Heel Slides AAROM;Right;10 reps  Hip ABduction/ADduction AAROM;Right;10 reps  PT - End of Session  Equipment Utilized During Treatment Gait belt  Activity Tolerance Patient tolerated treatment well  Patient left in bed;with call bell/phone within reach;with family/visitor present   PT - Assessment/Plan  PT Plan Current plan remains appropriate  PT Visit Diagnosis Pain;Difficulty in walking, not elsewhere classified (R26.2)  Pain - Right/Left Right  Pain - part of body Hip  PT Frequency (ACUTE ONLY) 7X/week  Follow Up Recommendations Follow physician's recommendations  for discharge plan and follow up therapies  Assistance recommended at discharge Frequent or constant Supervision/Assistance  Patient can return home with the following A little help with walking and/or transfers;A little help with bathing/dressing/bathroom;Assistance with cooking/housework;Assist for transportation;Help with stairs or ramp for entrance  PT equipment None recommended by PT  AM-PAC PT "6 Clicks" Mobility Outcome Measure (Version 2)  Help needed turning from your back to your side while in a flat bed without using bedrails? 2  Help needed moving from lying on your back to sitting on the side of a flat bed without using bedrails? 2  Help needed moving to and from a bed to a chair (including a wheelchair)? 2  Help needed standing up from a chair using your arms (e.g., wheelchair or bedside chair)? 2  Help needed to walk in hospital room? 2  Help needed climbing 3-5 steps with a railing?  2  6 Click Score 12  Consider Recommendation of Discharge To: CIR/SNF/LTACH  PT Goal Progression  Progress towards PT goals Progressing toward goals  Acute Rehab PT Goals  PT Goal Formulation With patient  Time For Goal Achievement 04/08/22  Potential to Achieve Goals Good  PT Time Calculation  PT Start Time (ACUTE ONLY) 1639  PT Stop Time (ACUTE ONLY) 1659  PT Time Calculation (min) (ACUTE ONLY) 20 min  PT General Charges  $$ ACUTE PT VISIT 1 Visit  PT Treatments  $Therapeutic Exercise 8-22 mins

## 2022-04-03 NOTE — Plan of Care (Signed)
  Problem: Education: Goal: Knowledge of General Education information will improve Description Including pain rating scale, medication(s)/side effects and non-pharmacologic comfort measures Outcome: Progressing   

## 2022-04-03 NOTE — Plan of Care (Signed)
  Problem: Education: Goal: Knowledge of the prescribed therapeutic regimen will improve Outcome: Progressing Goal: Understanding of discharge needs will improve Outcome: Progressing   Problem: Activity: Goal: Ability to avoid complications of mobility impairment will improve Outcome: Progressing   Problem: Clinical Measurements: Goal: Postoperative complications will be avoided or minimized Outcome: Progressing   Problem: Pain Management: Goal: Pain level will decrease with appropriate interventions Outcome: Progressing

## 2022-04-03 NOTE — Progress Notes (Signed)
Progress Note  Patient Name: Manuel WINGERT Sr. Date of Encounter: 04/03/2022  Primary Cardiologist: Franchot Mimes, MD  Subjective   No chest pain or palpitations, comfortable this morning.  Inpatient Medications    Scheduled Meds:  apixaban  5 mg Oral BID   atorvastatin  10 mg Oral QHS   diltiazem  240 mg Oral Daily   docusate sodium  100 mg Oral BID   gabapentin  200 mg Oral QHS   insulin aspart  0-15 Units Subcutaneous TID WC   insulin aspart  0-5 Units Subcutaneous QHS   linagliptin  5 mg Oral Daily   And   metFORMIN  1,000 mg Oral Q breakfast   pantoprazole  40 mg Oral Daily   rOPINIRole  1 mg Oral TID WC   rOPINIRole  3 mg Oral QHS   [START ON 04/04/2022] Vitamin D (Ergocalciferol)  50,000 Units Oral Q Mon   Continuous Infusions:  sodium chloride 75 mL/hr at 04/01/22 1501   methocarbamol (ROBAXIN) IV     PRN Meds: acetaminophen, alum & mag hydroxide-simeth, diphenhydrAMINE, HYDROmorphone (DILAUDID) injection, HYDROmorphone, menthol-cetylpyridinium **OR** phenol, methocarbamol **OR** methocarbamol (ROBAXIN) IV, metoCLOPramide **OR** metoCLOPramide (REGLAN) injection, ondansetron **OR** ondansetron (ZOFRAN) IV, oxyCODONE, polyethylene glycol   Vital Signs    Vitals:   04/02/22 2019 04/02/22 2155 04/03/22 0202 04/03/22 0602  BP:  (!) 128/104 (!) 123/95 (!) 160/68  Pulse: (!) 114 98 (!) 105 (!) 106  Resp:  '18 18 18  '$ Temp:  98.9 F (37.2 C) 98.3 F (36.8 C) 97.9 F (36.6 C)  TempSrc:      SpO2: 97% 95% 95% 94%  Weight:      Height:        Intake/Output Summary (Last 24 hours) at 04/03/2022 0819 Last data filed at 04/03/2022 0311 Gross per 24 hour  Intake 967.1 ml  Output 1425 ml  Net -457.9 ml   Filed Weights   04/01/22 0643  Weight: 107.5 kg    Telemetry    Atrial fibrillation with RVR.  Personally reviewed.  ECG    An ECG dated 04/02/2022 was personally reviewed today and demonstrated:  Atrial fibrillation with RVR.  Physical Exam    GEN: No acute distress.   Neck: No JVD. Cardiac: Irregularly irregular, no S3, 1/6 systolic murmur.  Respiratory: Nonlabored. Clear to auscultation bilaterally. GI: Soft, nontender, bowel sounds present. MS: Compression stockings bilaterally. Neuro:  Nonfocal. Psych: Alert and oriented x 3. Normal affect.  Labs    Chemistry Recent Labs  Lab 04/02/22 0328  NA 139  K 4.4  CL 105  CO2 27  GLUCOSE 168*  BUN 19  CREATININE 0.88  CALCIUM 8.6*  GFRNONAA >60  ANIONGAP 7     Hematology Recent Labs  Lab 04/02/22 0328  WBC 18.8*  RBC 3.79*  HGB 12.6*  HCT 37.4*  MCV 98.7  MCH 33.2  MCHC 33.7  RDW 13.4  PLT 272    Cardiac Enzymes Recent Labs  Lab 03/14/22 1729  TROPONINIHS 12     Radiology    DG Pelvis Portable  Result Date: 04/01/2022 CLINICAL DATA:  Postop right hip replacement EXAM: PORTABLE PELVIS 1-2 VIEWS COMPARISON:  Pelvis 05/08/2017 FINDINGS: Right hip replacement in satisfactory position and alignment. No fracture or complication. Pre-existing left hip replacement without complication Surgical clips in the pelvis.  Penile implant. IMPRESSION: Satisfactory right hip replacement Electronically Signed   By: Franchot Gallo M.D.   On: 04/01/2022 11:03   DG HIP UNILAT  WITH PELVIS 1V RIGHT  Result Date: 04/01/2022 CLINICAL DATA:  Right hip replacement. EXAM: DG HIP (WITH OR WITHOUT PELVIS) 1V RIGHT; DG C-ARM 1-60 MIN-NO REPORT Radiation exposure index: 4.427 mGy. COMPARISON:  None Available. FINDINGS: Three intraoperative fluoroscopic images were obtained of the right hip. The right femoral and acetabular components are well situated. IMPRESSION: Fluoroscopic guidance provided during right total hip arthroplasty. Electronically Signed   By: Marijo Conception M.D.   On: 04/01/2022 10:16   DG C-Arm 1-60 Min-No Report  Result Date: 04/01/2022 Fluoroscopy was utilized by the requesting physician.  No radiographic interpretation.   DG C-Arm 1-60 Min-No  Report  Result Date: 04/01/2022 Fluoroscopy was utilized by the requesting physician.  No radiographic interpretation.     Assessment & Plan    1.  Permanent atrial fibrillation with CHA2DS2-VASc score of 4.  Heart rate still not controlled, he remains with RVR this morning despite adjustments made yesterday.  Now on Eliquis 5 mg twice daily, plan to increase Cardizem CD to 240 mg daily dose this morning.  2.  Postop day 3 status post right hip arthroplasty under spinal anesthesia.  3.  Essential hypertension, blood pressure stable.  4.  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  5.  Restless leg syndrome, phenytoin was stopped to reduce interaction with Eliquis.  Discussed with patient this morning.  Not entirely clear why heart rate control is so difficult at this point, perhaps it was not as well controlled as was thought as an outpatient on Cardizem CD 120 mg daily.  He is afebrile and only mildly anemic, no other acute symptoms.  Plan to increase Cardizem CD to 240 mg daily and further uptitrate if necessary.  Might add beta-blocker to this if not effective.  Signed, Rozann Lesches, MD  04/03/2022, 8:19 AM

## 2022-04-03 NOTE — Progress Notes (Signed)
Pt refused to wear cpap tonight. Machine remained bedside and ready to use if pt changes his mind.

## 2022-04-03 NOTE — Progress Notes (Signed)
Subjective: 2 Days Post-Op Procedure(s) (LRB): RIGHT TOTAL HIP ARTHROPLASTY ANTERIOR APPROACH (Right) Patient reports pain as moderate.    Objective: Vital signs in last 24 hours: Temp:  [97.9 F (36.6 C)-99.2 F (37.3 C)] 97.9 F (36.6 C) (09/17 0602) Pulse Rate:  [98-127] 106 (09/17 0602) Resp:  [17-22] 18 (09/17 0602) BP: (104-160)/(54-104) 160/68 (09/17 0602) SpO2:  [93 %-97 %] 94 % (09/17 0602)  Intake/Output from previous day: 09/16 0701 - 09/17 0700 In: 967.1 [P.O.:960; I.V.:7.1] Out: 1425 [Urine:1425] Intake/Output this shift: Total I/O In: 240 [P.O.:240] Out: -   Recent Labs    04/02/22 0328  HGB 12.6*   Recent Labs    04/02/22 0328  WBC 18.8*  RBC 3.79*  HCT 37.4*  PLT 272   Recent Labs    04/02/22 0328  NA 139  K 4.4  CL 105  CO2 27  BUN 19  CREATININE 0.88  GLUCOSE 168*  CALCIUM 8.6*   No results for input(s): "LABPT", "INR" in the last 72 hours.  Neurologically intact Neurovascular intact Sensation intact distally Intact pulses distally Dorsiflexion/Plantar flexion intact Incision: scant drainage No cellulitis present Compartment soft   Assessment/Plan: 2 Days Post-Op Procedure(s) (LRB): RIGHT TOTAL HIP ARTHROPLASTY ANTERIOR APPROACH (Right) Advance diet D/C IV fluids WBAT RLE Would like to really limit activity as patient has been in a-fib with rvr   Anticipated LOS equal to or greater than 2 midnights due to - Age 27 and older with one or more of the following:  - Obesity  - Expected need for hospital services (PT, OT, Nursing) required for safe  discharge  - Anticipated need for postoperative skilled nursing care or inpatient rehab  - Active co-morbidities: Cardiac Arrhythmia OR   - Unanticipated findings during/Post Surgery: Slow post-op progression: GI, pain control, mobility  - Patient is a high risk of re-admission due to: a-fib with rvr   Aundra Dubin 04/03/2022, 8:50 AM

## 2022-04-03 NOTE — Progress Notes (Signed)
Physical Therapy Treatment Patient Details Name: Manuel GOBERT Sr. MRN: [id number] DOB: [birth year] Today's Date: 04/03/2022   History of Present Illness Pt is an 81yo male presenting s/p R-THA, AA on 04/01/22.  pt with afib post op/rate poorly controlled and cardiology consulted. PMH: afib, DM, GERD, HTN, hx of prostate cancer s/p prostatectomy, OSA on CPAP, L-TKA 2009, L-THA 2018, L-reverse total shoulder 2021, R-TKA 2019.    PT Comments    Pt cleared to work with PT per cardiology (via secure chat) . Pt amb short distance in room, with HR 120s resting, incr to max HR 145 with activity.  Pt returned to supine with HR 120s to 130s.  Pt frustrated he is not able to do more; have reassured pt of the normalcy of his progression given post op issues.    Recommendations for follow up therapy are one component of a multi-disciplinary discharge planning process, led by the attending physician.  Recommendations may be updated based on patient status, additional functional criteria and insurance authorization.  Follow Up Recommendations  Follow physician's recommendations for discharge plan and follow up therapies     Assistance Recommended at Discharge Frequent or constant Supervision/Assistance  Patient can return home with the following A little help with walking and/or transfers;A little help with bathing/dressing/bathroom;Assistance with cooking/housework;Assist for transportation;Help with stairs or ramp for entrance   Equipment Recommendations  None recommended by PT    Recommendations for Other Services       Precautions / Restrictions Precautions Precautions: Fall Restrictions Weight Bearing Restrictions: No Other Position/Activity Restrictions: wbat     Mobility  Bed Mobility Overal bed mobility: Needs Assistance Bed Mobility: Supine to Sit     Supine to sit: Mod assist, HOB elevated Sit to supine: Mod assist   General bed mobility comments: incr time, cues for  breathing, sequence and self assist. assist needed to progress LEs off and on to bed and elevate/control trunk descent; HOB elevated to decr pt effort    Transfers Overall transfer level: Needs assistance Equipment used: Rolling walker (2 wheels) Transfers: Sit to/from Stand Sit to Stand: Min assist, From elevated surface           General transfer comment: cues for hand placement, assist with anterior superior wt transfer and to control descent as well as to extend RLE for improved pain control.    Ambulation/Gait Ambulation/Gait assistance: Min assist Gait Distance (Feet): 25 Feet Assistive device: Rolling walker (2 wheels) Gait Pattern/deviations: Step-to pattern, Decreased step length - right, Decreased step length - left, Decreased dorsiflexion - right, Decreased stance time - right       General Gait Details: gait distance limited by PT d/t afib/elevated HR; cues for sequence and to stay in frame of RW. HR max 145 with amb, 120s at rest   Stairs             Wheelchair Mobility    Modified Rankin (Stroke Patients Only)       Balance                                            Cognition Arousal/Alertness: Awake/alert Behavior During Therapy: WFL for tasks assessed/performed Overall Cognitive Status: Within Functional Limits for tasks assessed  Exercises      General Comments        Pertinent Vitals/Pain Pain Assessment Pain Assessment: Faces Faces Pain Scale: Hurts little more Pain Location: right hip Pain Descriptors / Indicators: Grimacing, Guarding, Burning, Tightness Pain Intervention(s): Limited activity within patient's tolerance, Monitored during session, Premedicated before session, Repositioned    Home Living                          Prior Function            PT Goals (current goals can now be found in the care plan section) Acute Rehab PT  Goals Patient Stated Goal: To walk without pain PT Goal Formulation: With patient Time For Goal Achievement: 04/08/22 Potential to Achieve Goals: Good Progress towards PT goals: Progressing toward goals    Frequency    7X/week      PT Plan Current plan remains appropriate    Co-evaluation              AM-PAC PT "6 Clicks" Mobility   Outcome Measure  Help needed turning from your back to your side while in a flat bed without using bedrails?: A Lot Help needed moving from lying on your back to sitting on the side of a flat bed without using bedrails?: A Lot Help needed moving to and from a bed to a chair (including a wheelchair)?: A Lot Help needed standing up from a chair using your arms (e.g., wheelchair or bedside chair)?: A Lot Help needed to walk in hospital room?: A Lot Help needed climbing 3-5 steps with a railing? : A Lot 6 Click Score: 12    End of Session Equipment Utilized During Treatment: Gait belt Activity Tolerance: Patient tolerated treatment well Patient left: in bed;with call bell/phone within reach;with family/visitor present   PT Visit Diagnosis: Pain;Difficulty in walking, not elsewhere classified (R26.2) Pain - Right/Left: Right Pain - part of body: Hip     Time: 9518-8416 PT Time Calculation (min) (ACUTE ONLY): 24 min  Charges:  $Gait Training: 8-22 mins $Therapeutic Activity: 8-22 mins                     Baxter Flattery, PT  Acute Rehab Dept [facility]) [phone]  [facility] Weekend Pager (Saturday/Sunday only)  [phone]  04/03/2022    [facility] 04/03/2022, 3:54 PM

## 2022-04-04 DIAGNOSIS — I480 Paroxysmal atrial fibrillation: Secondary | ICD-10-CM | POA: Diagnosis not present

## 2022-04-04 LAB — GLUCOSE, CAPILLARY
Glucose-Capillary: 156 mg/dL — ABNORMAL HIGH (ref 70–99)
Glucose-Capillary: 165 mg/dL — ABNORMAL HIGH (ref 70–99)
Glucose-Capillary: 186 mg/dL — ABNORMAL HIGH (ref 70–99)
Glucose-Capillary: 149 mg/dL — ABNORMAL HIGH (ref 70–99)

## 2022-04-04 MED ORDER — METOPROLOL TARTRATE 12.5 MG HALF TABLET
12.5000 mg | ORAL_TABLET | Freq: Four times a day (QID) | ORAL | Status: DC
  Administered 2022-04-04 – 2022-04-05 (×4): 12.5 mg via ORAL
  Filled 2022-04-04 (×4): qty 1

## 2022-04-04 MED ORDER — LEVALBUTEROL HCL 0.63 MG/3ML IN NEBU
0.6300 mg | INHALATION_SOLUTION | Freq: Four times a day (QID) | RESPIRATORY_TRACT | Status: DC | PRN
  Administered 2022-04-04: 0.63 mg via RESPIRATORY_TRACT
  Filled 2022-04-04: qty 3

## 2022-04-04 NOTE — Plan of Care (Signed)
Problem: Clinical Measurements: Goal: Cardiovascular complication will be avoided Outcome: Not Progressing   Problem: Pain Managment: Goal: General experience of comfort will improve Outcome: Progressing   Problem: Education: Goal: Knowledge of General Education information will improve Description: Including pain rating scale, medication(s)/side effects and non-pharmacologic comfort measures Outcome: [place] [facility] Daiton Cowles, RN 04/04/22 9:49 AM

## 2022-04-04 NOTE — Progress Notes (Signed)
Physical Therapy Treatment Patient Details Name: Manuel RAGONE Sr. MRN: [id number] DOB: 18-Jan-1941 Today's Date: 04/04/2022   History of Present Illness Pt is an 81yo male presenting s/p R-THA, AA on 04/01/22.  pt with afib post op/rate poorly controlled and cardiology consulted. PMH: afib, DM, GERD, HTN, hx of prostate cancer s/p prostatectomy, OSA on CPAP, L-TKA 2009, L-THA 2018, L-reverse total shoulder 2021, R-TKA 2019.    PT Comments    Pt assisted up to chair for lunch.  Continue to limit activity d/t elevated HR/afib. HR 127 at rest, 135 with EOB, 148 max HR with transfer (tachy/afib entirety of session).   Recommendations for follow up therapy are one component of a multi-disciplinary discharge planning process, led by the attending physician.  Recommendations may be updated based on patient status, additional functional criteria and insurance authorization.  Follow Up Recommendations  Follow physician's recommendations for discharge plan and follow up therapies     Assistance Recommended at Discharge Frequent or constant Supervision/Assistance  Patient can return home with the following A little help with walking and/or transfers;A little help with bathing/dressing/bathroom;Assistance with cooking/housework;Assist for transportation;Help with stairs or ramp for entrance   Equipment Recommendations  None recommended by PT    Recommendations for Other Services       Precautions / Restrictions Precautions Precautions: Fall Restrictions Weight Bearing Restrictions: No RLE Weight Bearing: Weight bearing as tolerated Other Position/Activity Restrictions: wbat     Mobility  Bed Mobility Overal bed mobility: Needs Assistance Bed Mobility: Supine to Sit     Supine to sit: Mod assist, HOB elevated          Transfers Overall transfer level: Needs assistance Equipment used: Rolling walker (2 wheels) Transfers: Sit to/from Stand Sit to Stand: Min assist, From elevated  surface   Step pivot transfers: Min assist       General transfer comment: cues for hand placement, assist with anterior superior wt transfer and to control descent as well as to extend RLE for improved pain control.    Ambulation/Gait                   Stairs             Wheelchair Mobility    Modified Rankin (Stroke Patients Only)       Balance                                            Cognition Arousal/Alertness: Awake/alert Behavior During Therapy: WFL for tasks assessed/performed Overall Cognitive Status: Within Functional Limits for tasks assessed                                          Exercises      General Comments        Pertinent Vitals/Pain Pain Assessment Pain Assessment: Faces Faces Pain Scale: Hurts even more Pain Location: right hip Pain Descriptors / Indicators: Grimacing, Guarding, Burning, Tightness Pain Intervention(s): Limited activity within patient's tolerance, Monitored during session, Premedicated before session, Repositioned    Home Living                          Prior Function            PT  Goals (current goals can now be found in the care plan section) Acute Rehab PT Goals Patient Stated Goal: To walk without pain PT Goal Formulation: With patient Time For Goal Achievement: 04/08/22 Potential to Achieve Goals: Good Progress towards PT goals: Progressing toward goals    Frequency    7X/week      PT Plan Current plan remains appropriate    Co-evaluation              AM-PAC PT "6 Clicks" Mobility   Outcome Measure  Help needed turning from your back to your side while in a flat bed without using bedrails?: A Lot Help needed moving from lying on your back to sitting on the side of a flat bed without using bedrails?: A Lot Help needed moving to and from a bed to a chair (including a wheelchair)?: A Little Help needed standing up from a chair using  your arms (e.g., wheelchair or bedside chair)?: A Little Help needed to walk in hospital room?: A Lot Help needed climbing 3-5 steps with a railing? : A Lot 6 Click Score: 14    End of Session Equipment Utilized During Treatment: Gait belt Activity Tolerance: Patient tolerated treatment well Patient left: with call bell/phone within reach;with family/visitor present;in chair   PT Visit Diagnosis: Pain;Difficulty in walking, not elsewhere classified (R26.2) Pain - Right/Left: Right Pain - part of body: Hip     Time: 1017-5102 PT Time Calculation (min) (ACUTE ONLY): 21 min  Charges:  $Gait Training: 8-22 mins $Therapeutic Activity: 8-22 mins                     Baxter Flattery, PT  Acute Rehab Dept [facility]) [phone]  [facility] Weekend Pager (Saturday/Sunday only)  [phone]  04/04/2022    [facility] 04/04/2022, 11:50 AM

## 2022-04-04 NOTE — Progress Notes (Signed)
Progress Note  Patient Name: Manuel ROPER Sr. Date of Encounter: 04/04/2022  Primary Cardiologist: Franchot Mimes, MD  Subjective   He is in pain. Otherwise doing ok.   Inpatient Medications    Scheduled Meds:  apixaban  5 mg Oral BID   atorvastatin  10 mg Oral QHS   diltiazem  240 mg Oral Daily   docusate sodium  100 mg Oral BID   gabapentin  200 mg Oral QHS   insulin aspart  0-15 Units Subcutaneous TID WC   insulin aspart  0-5 Units Subcutaneous QHS   linagliptin  5 mg Oral Daily   And   metFORMIN  1,000 mg Oral Q breakfast   pantoprazole  40 mg Oral Daily   rOPINIRole  1 mg Oral TID WC   rOPINIRole  3 mg Oral QHS   Vitamin D (Ergocalciferol)  50,000 Units Oral Q Mon   Continuous Infusions:  sodium chloride 75 mL/hr at 04/01/22 1501   methocarbamol (ROBAXIN) IV     PRN Meds: acetaminophen, alum & mag hydroxide-simeth, diphenhydrAMINE, HYDROmorphone (DILAUDID) injection, HYDROmorphone, menthol-cetylpyridinium **OR** phenol, methocarbamol **OR** methocarbamol (ROBAXIN) IV, metoCLOPramide **OR** metoCLOPramide (REGLAN) injection, ondansetron **OR** ondansetron (ZOFRAN) IV, oxyCODONE, polyethylene glycol   Vital Signs    Vitals:   04/03/22 2012 04/03/22 2350 04/04/22 0400 04/04/22 0910  BP: 125/64 (!) 144/77 (!) 140/60 128/64  Pulse: 95  (!) 120 99  Resp:  '16 16 18  '$ Temp: 98.1 F (36.7 C) 99.1 F (37.3 C) 98.2 F (36.8 C) 98.8 F (37.1 C)  TempSrc: Oral Oral Oral   SpO2: 96% 95%  97%  Weight:      Height:        Intake/Output Summary (Last 24 hours) at 04/04/2022 1123 Last data filed at 04/04/2022 0738 Gross per 24 hour  Intake 840 ml  Output 1900 ml  Net -1060 ml   Filed Weights   04/01/22 0643  Weight: 107.5 kg    Telemetry    Atrial fibrillation with RVR still.  Personally reviewed.  ECG    NA  Physical Exam   GEN: No acute distress.   Neck: No JVD. Cardiac: Irregularly irregular, no S3,   Respiratory: Nonlabored. Clear to  auscultation bilaterally. GI: Soft, nontender, bowel sounds present. MS: Compression stockings bilaterally. Neuro:  Nonfocal. Psych: Alert and oriented x 3. Normal affect.  Labs    Chemistry Recent Labs  Lab 04/02/22 0328  NA 139  K 4.4  CL 105  CO2 27  GLUCOSE 168*  BUN 19  CREATININE 0.88  CALCIUM 8.6*  GFRNONAA >60  ANIONGAP 7     Hematology Recent Labs  Lab 04/02/22 0328  WBC 18.8*  RBC 3.79*  HGB 12.6*  HCT 37.4*  MCV 98.7  MCH 33.2  MCHC 33.7  RDW 13.4  PLT 272    Cardiac Enzymes Recent Labs  Lab 03/14/22 1729  TROPONINIHS 12     Radiology    No results found.   Assessment & Plan    1.  Paroxysmal atrial fibrillation with CHA2DS2-VASc score of 4.  Heart rate still not controlled, he remains with RVR this morning despite adjustments made yesterday.  Now on Eliquis 5 mg twice daily,  Cardizem CD to 240 mg daily dose increased 9/17. He's euvolemic. Will add metop.  2.  Postop day 3 status post right hip arthroplasty under spinal anesthesia.  3.  Essential hypertension, blood pressure stable.  4.  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  5.  Restless leg syndrome, phenytoin was stopped to reduce interaction with Eliquis.   Signed, Janina Mayo, MD  04/04/2022, 11:23 AM

## 2022-04-04 NOTE — Progress Notes (Addendum)
Patient ID: Manuel Grippe Sr., male   DOB: 1940-11-04, 81 y.o.   MRN: [id number] The patient's heart rate continues to remain fluctuating and staying tachycardic.  He is still asymptomatic.  He is definitely having difficulty with mobility from the hip replacement and the pain associated with hip replacement.  I appreciate cardiology following this patient.  Obviously we cannot discharge him until it is A-fib rate is controlled.  We will await cardiology's recommendations today.

## 2022-04-05 DIAGNOSIS — I480 Paroxysmal atrial fibrillation: Secondary | ICD-10-CM | POA: Diagnosis not present

## 2022-04-05 LAB — GLUCOSE, CAPILLARY
Glucose-Capillary: 194 mg/dL — ABNORMAL HIGH (ref 70–99)
Glucose-Capillary: 149 mg/dL — ABNORMAL HIGH (ref 70–99)
Glucose-Capillary: 153 mg/dL — ABNORMAL HIGH (ref 70–99)
Glucose-Capillary: 130 mg/dL — ABNORMAL HIGH (ref 70–99)

## 2022-04-05 MED ORDER — METOPROLOL TARTRATE 25 MG PO TABS
25.0000 mg | ORAL_TABLET | Freq: Four times a day (QID) | ORAL | Status: DC
  Administered 2022-04-05 – 2022-04-06 (×4): 25 mg via ORAL
  Filled 2022-04-05 (×4): qty 1

## 2022-04-05 NOTE — Care Management Important Message (Signed)
Important Message  Patient Details IM Letter given to the Patient. Name: Manuel LOEBER Sr. MRN: [id number] Date of Birth: 03/30/1941   Medicare Important Message Given:  Yes     Kerin Salen 04/05/2022, 12:04 PM

## 2022-04-05 NOTE — Progress Notes (Signed)
   04/05/22 1516  PT Visit Information  Last PT Received On 04/05/22  Assistance Needed  Exercise focused session, HR ,ax 128, low 100s most of session. Pt tol well. Has 2 steps with no rails, will work on this as pt medically  able. Pt/wife motivated to d/c home   History of Present Illness Pt is an 81yo male presenting s/p R-THA, AA on 04/01/22.  pt with afib post op/rate poorly controlled and cardiology consulted. PMH: afib, DM, GERD, HTN, hx of prostate cancer s/p prostatectomy, OSA on CPAP, L-TKA 2009, L-THA 2018, L-reverse total shoulder 2021, R-TKA 2019.  Subjective Data  Patient Stated Goal To walk without pain  Precautions  Precautions Fall  Restrictions  Weight Bearing Restrictions No  Other Position/Activity Restrictions wbat  Pain Assessment  Faces Pain Scale 4  Pain Location right hip  Pain Descriptors / Indicators Grimacing;Guarding;Burning;Tightness  Cognition  Arousal/Alertness Awake/alert  Behavior During Therapy WFL for tasks assessed/performed  Overall Cognitive Status Within Functional Limits for tasks assessed  Total Joint Exercises  Ankle Circles/Pumps AROM;Both;10 reps  Quad Sets AROM;Both;10 reps  Gluteal Sets AROM;Both;Supine;10 reps  Short [place] [employer];Right;10 reps  Heel Slides AAROM;Right;10 reps  Hip ABduction/ADduction AAROM;Right;10 reps  PT - End of Session  Equipment Utilized During Treatment Gait belt  Activity Tolerance Patient tolerated treatment well  Patient left in bed;with call bell/phone within reach;with family/visitor present   PT - Assessment/Plan  PT Plan Current plan remains appropriate  PT Visit Diagnosis Pain;Difficulty in walking, not elsewhere classified (R26.2)  Pain - Right/Left Right  Pain - part of body Hip  PT Frequency (ACUTE ONLY) 7X/week  Follow Up Recommendations Follow physician's recommendations for discharge plan and follow up therapies  Assistance recommended at discharge Frequent or constant  Supervision/Assistance  Patient can return home with the following A little help with walking and/or transfers;A little help with bathing/dressing/bathroom;Assistance with cooking/housework;Assist for transportation;Help with stairs or ramp for entrance  PT equipment None recommended by PT  AM-PAC PT "6 Clicks" Mobility Outcome Measure (Version 2)  Help needed turning from your back to your side while in a flat bed without using bedrails? 2  Help needed moving from lying on your back to sitting on the side of a flat bed without using bedrails? 2  Help needed moving to and from a bed to a chair (including a wheelchair)? 2  Help needed standing up from a chair using your arms (e.g., wheelchair or bedside chair)? 2  Help needed to walk in hospital room? 2  Help needed climbing 3-5 steps with a railing?  2  6 Click Score 12  Consider Recommendation of Discharge To: CIR/SNF/LTACH  PT Goal Progression  Progress towards PT goals Progressing toward goals  Acute Rehab PT Goals  PT Goal Formulation With patient  Time For Goal Achievement 04/08/22  Potential to Achieve Goals Good  PT Time Calculation  PT Start Time (ACUTE ONLY) 1441  PT Stop Time (ACUTE ONLY) 1505  PT Time Calculation (min) (ACUTE ONLY) 24 min  PT General Charges  $$ ACUTE PT VISIT 1 Visit  PT Treatments  $Therapeutic Exercise 23-37 mins

## 2022-04-05 NOTE — Progress Notes (Signed)
Physical Therapy Treatment Patient Details Name: Manuel Hunter. MRN: [id number] DOB: [birth year] Today's Date: 04/05/2022   History of Present Illness Pt is an 81yo male presenting s/p R-THA, AA on 04/01/22.  pt with afib post op/rate poorly controlled and cardiology consulted. PMH: afib, DM, GERD, HTN, hx of prostate cancer s/p prostatectomy, OSA on CPAP, L-TKA 2009, L-THA 2018, L-reverse total shoulder 2021, R-TKA 2019.    PT Comments    Pt progressing with gait today, distance limited by PT. HR in 110s-120s to max 140 during mobility.  Pain better controlled with activity. Continue to mobilize as able. Pt will likely be able to d/c home if continues to progress/HR  and rhythm  controlled  Recommendations for follow up therapy are one component of a multi-disciplinary discharge planning process, led by the attending physician.  Recommendations may be updated based on patient status, additional functional criteria and insurance authorization.  Follow Up Recommendations  Follow physician's recommendations for discharge plan and follow up therapies     Assistance Recommended at Discharge Frequent or constant Supervision/Assistance  Patient can return home with the following A little help with walking and/or transfers;A little help with bathing/dressing/bathroom;Assistance with cooking/housework;Assist for transportation;Help with stairs or ramp for entrance   Equipment Recommendations  None recommended by PT    Recommendations for Other Services       Precautions / Restrictions Precautions Precautions: Fall Restrictions Weight Bearing Restrictions: No Other Position/Activity Restrictions: wbat     Mobility  Bed Mobility               General bed mobility comments: in chair    Transfers Overall transfer level: Needs assistance Equipment used: Rolling walker (2 wheels) Transfers: Sit to/from Stand Sit to Stand: Min guard           General transfer comment:  cues for hand placement  and to control descent as well as to extend RLE for improved pain control. min/guard for safety    Ambulation/Gait Ambulation/Gait assistance: Min guard, Min assist Gait Distance (Feet): 24 Feet Assistive device: Rolling walker (2 wheels) Gait Pattern/deviations: Step-to pattern, Decreased step length - right, Decreased step length - left, Decreased dorsiflexion - right, Decreased stance time - right       General Gait Details: initially requiring assist to advance RLE, after 5' pt able to advance RLE of his own volition. incr time, HR to 140 max, 120s to 100s most of distance   Stairs             Wheelchair Mobility    Modified Rankin (Stroke Patients Only)       Balance                                            Cognition Arousal/Alertness: Awake/alert Behavior During Therapy: WFL for tasks assessed/performed Overall Cognitive Status: Within Functional Limits for tasks assessed                                          Exercises Total Joint Exercises Ankle Circles/Pumps: AROM, Both, 10 reps Quad Sets: AROM, Both, 10 reps Gluteal Sets: AROM, Both, Supine, 10 reps Short Arc Quad: AROM, AAROM, Right, 10 reps Heel Slides: AAROM, Right, 10 reps Hip ABduction/ADduction: AAROM, Right, 10 reps  General Comments        Pertinent Vitals/Pain Pain Assessment Pain Assessment: Faces Faces Pain Scale: Hurts little more Pain Location: right hip Pain Descriptors / Indicators: Grimacing, Guarding, Burning, Tightness Pain Intervention(s): Limited activity within patient's tolerance, Monitored during session, Repositioned    Home Living                          Prior Function            PT Goals (current goals can now be found in the care plan section) Acute Rehab PT Goals Patient Stated Goal: To walk without pain PT Goal Formulation: With patient Time For Goal Achievement:  04/08/22 Potential to Achieve Goals: Good Progress towards PT goals: Progressing toward goals    Frequency    7X/week      PT Plan Current plan remains appropriate    Co-evaluation              AM-PAC PT "6 Clicks" Mobility   Outcome Measure  Help needed turning from your back to your side while in a flat bed without using bedrails?: A Lot Help needed moving from lying on your back to sitting on the side of a flat bed without using bedrails?: A Lot Help needed moving to and from a bed to a chair (including a wheelchair)?: A Lot Help needed standing up from a chair using your arms (e.g., wheelchair or bedside chair)?: A Lot Help needed to walk in hospital room?: A Lot Help needed climbing 3-5 steps with a railing? : A Lot 6 Click Score: 12    End of Session Equipment Utilized During Treatment: Gait belt Activity Tolerance: Patient tolerated treatment well Patient left: in bed;with call bell/phone within reach;with family/visitor present   PT Visit Diagnosis: Pain;Difficulty in walking, not elsewhere classified (R26.2) Pain - Right/Left: Right Pain - part of body: Hip     Time: 1202-1235 PT Time Calculation (min) (ACUTE ONLY): 33 min  Charges:  $Gait Training: 23-37 mins                     Baxter Flattery, PT  Acute Rehab Dept [facility]) [phone]  [facility] Weekend Pager (Saturday/Sunday only)  [phone]  04/05/2022    [facility] 04/05/2022, 3:16 PM

## 2022-04-05 NOTE — Progress Notes (Signed)
Patient ID: Manuel Grippe Sr., male   DOB: May 23, 1941, 81 y.o.   MRN: [id number] The patient is resting this am.  His heart rate on telemetry is still slightly elevated.  I do appreciate Cardiology's efforts with Manuel Hunter.  Hopefull today his pain will be improved enough to mobilize better.  I spoke with him yesterday about the possibility of short-term skilled nursing following this hospitalization if his mobility does not improve.  Will check on him later today.  Am operating at [facility] all day today.

## 2022-04-05 NOTE — Progress Notes (Addendum)
Progress Note  Patient Name: Manuel HARVEL Sr. Date of Encounter: 04/05/2022  Primary Cardiologist: Franchot Mimes, MD  Subjective   He is doing well. Adjusting to his medical conditions is challenging.  Inpatient Medications    Scheduled Meds:  apixaban  5 mg Oral BID   atorvastatin  10 mg Oral QHS   diltiazem  240 mg Oral Daily   docusate sodium  100 mg Oral BID   gabapentin  200 mg Oral QHS   insulin aspart  0-15 Units Subcutaneous TID WC   insulin aspart  0-5 Units Subcutaneous QHS   linagliptin  5 mg Oral Daily   And   metFORMIN  1,000 mg Oral Q breakfast   metoprolol tartrate  12.5 mg Oral Q6H   pantoprazole  40 mg Oral Daily   rOPINIRole  1 mg Oral TID WC   rOPINIRole  3 mg Oral QHS   Vitamin D (Ergocalciferol)  50,000 Units Oral Q Mon   Continuous Infusions:  sodium chloride 75 mL/hr at 04/01/22 1501   methocarbamol (ROBAXIN) IV     PRN Meds: acetaminophen, alum & mag hydroxide-simeth, diphenhydrAMINE, HYDROmorphone (DILAUDID) injection, HYDROmorphone, levalbuterol, menthol-cetylpyridinium **OR** phenol, methocarbamol **OR** methocarbamol (ROBAXIN) IV, metoCLOPramide **OR** metoCLOPramide (REGLAN) injection, ondansetron **OR** ondansetron (ZOFRAN) IV, oxyCODONE, polyethylene glycol   Vital Signs    Vitals:   04/04/22 2157 04/05/22 0210 04/05/22 0625 04/05/22 1040  BP: (!) 112/98 118/85 (!) 120/90 (!) 143/91  Pulse: 99 (!) 109 99 70  Resp: '17 18 18 18  '$ Temp: 98.2 F (36.8 C) 98.2 F (36.8 C) (!) 97 F (36.1 C) 99.4 F (37.4 C)  TempSrc: Oral Oral Axillary   SpO2: 93% 95% 95% 100%  Weight:      Height:        Intake/Output Summary (Last 24 hours) at 04/05/2022 1045 Last data filed at 04/05/2022 1000 Gross per 24 hour  Intake 1140 ml  Output 1500 ml  Net -360 ml   Filed Weights   04/01/22 0643  Weight: 107.5 kg    Telemetry    Atrial fibrillation , rates improved one teens to 120s.  Personally reviewed.  ECG    NA  Physical Exam    GEN: No acute distress.   Neck: No JVD. Cardiac: Irregularly irregular, no S3,   Respiratory: Nonlabored. Clear to auscultation bilaterally. GI: Soft, nontender, bowel sounds present. MS: Compression stockings bilaterally, ankle edema Neuro:  Nonfocal. Psych: Alert and oriented x 3. Normal affect.  Labs    Chemistry Recent Labs  Lab 04/02/22 0328  NA 139  K 4.4  CL 105  CO2 27  GLUCOSE 168*  BUN 19  CREATININE 0.88  CALCIUM 8.6*  GFRNONAA >60  ANIONGAP 7     Hematology Recent Labs  Lab 04/02/22 0328  WBC 18.8*  RBC 3.79*  HGB 12.6*  HCT 37.4*  MCV 98.7  MCH 33.2  MCHC 33.7  RDW 13.4  PLT 272    Cardiac Enzymes Recent Labs  Lab 03/14/22 1729  TROPONINIHS 12     Radiology    No results found.   Assessment & Plan    1.  Paroxysmal atrial fibrillation with CHA2DS2-VASc score of 4.  Heart rate still not controlled, he remains with RVR this morning despite adjustments made yesterday.  Now on Eliquis 5 mg twice daily,  Cardizem CD to 240 mg daily dose increased 9/17. He's euvolemic. Can uptitrate metop tartrate as long as Bps can tolerate for rate control. Plan  to consolidate to XL on discharge. He is otherwise stable and euvolemic.   2.  S/p post right hip arthroplasty under spinal anesthesia.  3.  Essential hypertension, blood pressure stable.  4.  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  5.  Restless leg syndrome, phenytoin was stopped to reduce interaction with Eliquis. Leg edema- compression stockings/ambulation.  Will follow peripherally for BB management and recommendations.   Signed, Janina Mayo, MD  04/05/2022, 10:45 AM

## 2022-04-06 ENCOUNTER — Inpatient Hospital Stay (HOSPITAL_COMMUNITY): Payer: Medicare HMO

## 2022-04-06 DIAGNOSIS — I4891 Unspecified atrial fibrillation: Secondary | ICD-10-CM | POA: Diagnosis not present

## 2022-04-06 DIAGNOSIS — I480 Paroxysmal atrial fibrillation: Secondary | ICD-10-CM | POA: Diagnosis not present

## 2022-04-06 LAB — GLUCOSE, CAPILLARY
Glucose-Capillary: 158 mg/dL — ABNORMAL HIGH (ref 70–99)
Glucose-Capillary: 122 mg/dL — ABNORMAL HIGH (ref 70–99)
Glucose-Capillary: 130 mg/dL — ABNORMAL HIGH (ref 70–99)
Glucose-Capillary: 129 mg/dL — ABNORMAL HIGH (ref 70–99)

## 2022-04-06 LAB — ECHOCARDIOGRAM COMPLETE
Height: 66 in
S' Lateral: 3.9 cm
Weight: 3791.98 oz

## 2022-04-06 MED ORDER — METOPROLOL TARTRATE 50 MG PO TABS
100.0000 mg | ORAL_TABLET | Freq: Two times a day (BID) | ORAL | Status: DC
  Administered 2022-04-06 – 2022-04-08 (×4): 100 mg via ORAL
  Filled 2022-04-06 (×4): qty 2

## 2022-04-06 MED ORDER — FUROSEMIDE 40 MG PO TABS
40.0000 mg | ORAL_TABLET | Freq: Every day | ORAL | Status: DC
  Administered 2022-04-06 – 2022-04-08 (×3): 40 mg via ORAL
  Filled 2022-04-06 (×3): qty 1

## 2022-04-06 NOTE — Progress Notes (Signed)
Pt connected to Pt's CPAP

## 2022-04-06 NOTE — Progress Notes (Signed)
   Patient Details Name: TREMANE SPURGEON Sr. MRN: [id number] DOB: Feb 25, 1941   Pt just got back to bed with help from Spouse.  Pt had an urgency to use the bathroom and neither Pt or Spouse called out for assistance.  "She helped me in there and I was able to stand at toilet".  Educated Pt and Spouse to call out for help.  "There was no waiting when I got to go".  Stated pt.  Pt back in bed wanting to rest so I activated the bed alarm.  Rica Koyanagi  PTA Acute  Rehabilitation Services Office M-F          [phone] Weekend pager [phone]

## 2022-04-06 NOTE — Progress Notes (Signed)
Patient has refused use of CPAP this evening. Patient stated mask didn't fit well and he was unable to rest so he was declining use at this time. Patient made aware a unit is available if needed from [facility].

## 2022-04-06 NOTE — Progress Notes (Signed)
Progress Note  Patient Name: Manuel ZIDEK Sr. Date of Encounter: 04/06/2022  Primary Cardiologist: Franchot Mimes, MD  Subjective   He notes some congestion overnight. He repeats the same questions each day.   Inpatient Medications    Scheduled Meds:  apixaban  5 mg Oral BID   atorvastatin  10 mg Oral QHS   diltiazem  240 mg Oral Daily   docusate sodium  100 mg Oral BID   furosemide  40 mg Oral Daily   gabapentin  200 mg Oral QHS   insulin aspart  0-15 Units Subcutaneous TID WC   insulin aspart  0-5 Units Subcutaneous QHS   linagliptin  5 mg Oral Daily   And   metFORMIN  1,000 mg Oral Q breakfast   metoprolol tartrate  25 mg Oral Q6H   pantoprazole  40 mg Oral Daily   rOPINIRole  1 mg Oral TID WC   rOPINIRole  3 mg Oral QHS   Vitamin D (Ergocalciferol)  50,000 Units Oral Q Mon   Continuous Infusions:  sodium chloride 75 mL/hr at 04/01/22 1501   methocarbamol (ROBAXIN) IV     PRN Meds: acetaminophen, alum & mag hydroxide-simeth, diphenhydrAMINE, HYDROmorphone (DILAUDID) injection, HYDROmorphone, levalbuterol, menthol-cetylpyridinium **OR** phenol, methocarbamol **OR** methocarbamol (ROBAXIN) IV, metoCLOPramide **OR** metoCLOPramide (REGLAN) injection, ondansetron **OR** ondansetron (ZOFRAN) IV, oxyCODONE, polyethylene glycol   Vital Signs    Vitals:   04/06/22 0031 04/06/22 0137 04/06/22 0520 04/06/22 1004  BP: 125/73 121/89 (!) 134/95 125/83  Pulse: 91 (!) 102 (!) 101 93  Resp: '18 17 17   '$ Temp: 97.8 F (36.6 C) 98 F (36.7 C) 97.6 F (36.4 C) 98 F (36.7 C)  TempSrc: Axillary Oral Oral   SpO2: 97% 93% 98% 97%  Weight:      Height:        Intake/Output Summary (Last 24 hours) at 04/06/2022 1038 Last data filed at 04/06/2022 0900 Gross per 24 hour  Intake 270 ml  Output 1725 ml  Net -1455 ml   Filed Weights   04/01/22 0643  Weight: 107.5 kg    Telemetry    Atrial fibrillation , rates improved mean 110  Personally reviewed.  ECG     NA  Physical Exam   GEN: No acute distress.   Neck: No JVD. Cardiac: Irregularly irregular, no S3,   Respiratory: Nonlabored. Clear to auscultation bilaterally. GI: Soft, nontender, bowel sounds present. MS: Compression stockings bilaterally, ankle edema Neuro:  Nonfocal. Psych: Alert and oriented x 3. Normal affect.  Labs    Chemistry Recent Labs  Lab 04/02/22 0328  NA 139  K 4.4  CL 105  CO2 27  GLUCOSE 168*  BUN 19  CREATININE 0.88  CALCIUM 8.6*  GFRNONAA >60  ANIONGAP 7     Hematology Recent Labs  Lab 04/02/22 0328  WBC 18.8*  RBC 3.79*  HGB 12.6*  HCT 37.4*  MCV 98.7  MCH 33.2  MCHC 33.7  RDW 13.4  PLT 272    Cardiac Enzymes Recent Labs  Lab 03/14/22 1729  TROPONINIHS 12     Radiology    No results found.   Assessment & Plan    1.  Paroxysmal atrial fibrillation with CHA2DS2-VASc score of 4.  Heart rate still not controlled, he remains with RVR this morning despite adjustments made yesterday.  Now on Eliquis 5 mg twice daily,  Cardizem CD to 240 mg daily dose increased 9/17. He notes some congestion, along with LE edema will add  a diuretic. Will get an echo today. Will change to metop tartrate 100 mg BID. If BP tolerate and HR remains < 120 predominantly , he can follow-up as an outpatient with this regimen.  2.  S/p post right hip arthroplasty under spinal anesthesia.  3.  Essential hypertension, blood pressure stable.  4.  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  5.  Restless leg syndrome, phenytoin was stopped to reduce interaction with Eliquis. Leg edema- compression stockings/ambulation.   Signed, Janina Mayo, MD  04/06/2022, 10:38 AM

## 2022-04-06 NOTE — Progress Notes (Signed)
Subjective: 5 Days Post-Op Procedure(s) (LRB): RIGHT TOTAL HIP ARTHROPLASTY ANTERIOR APPROACH (Right) Patient reports pain as moderate.    Objective: Vital signs in last 24 hours: Temp:  [97.6 F (36.4 C)-98.4 F (36.9 C)] 98 F (36.7 C) (09/20 1004) Pulse Rate:  [89-102] 93 (09/20 1004) Resp:  [17-20] 17 (09/20 0520) BP: (103-134)/(64-95) 125/83 (09/20 1004) SpO2:  [93 %-99 %] 97 % (09/20 1004)  Intake/Output from previous day: 09/19 0701 - 09/20 0700 In: 270 [P.O.:270] Out: 1800 [Urine:1800] Intake/Output this shift: Total I/O In: -  Out: 325 [Urine:325]  No results for input(s): "HGB" in the last 72 hours. No results for input(s): "WBC", "RBC", "HCT", "PLT" in the last 72 hours. No results for input(s): "NA", "K", "CL", "CO2", "BUN", "CREATININE", "GLUCOSE", "CALCIUM" in the last 72 hours. No results for input(s): "LABPT", "INR" in the last 72 hours.  Dorsiflexion/Plantar flexion intact Incision: moderate drainage Compartment soft Edema bilateral lower extremities Ecchymosis about the right hip incision.    Assessment/Plan: 5 Days Post-Op Procedure(s) (LRB): RIGHT TOTAL HIP ARTHROPLASTY ANTERIOR APPROACH (Right) Up with therapy- Progressing well with PT  Paroxsymal A--fib: Heart rate still not under control.  Compression hose applied bilateral lower extremities and dressing changed right hip.  Hopeful for discharge to home possible tomorrow.      Manuel Hunter 04/06/2022, 12:46 PM

## 2022-04-06 NOTE — Progress Notes (Signed)
Physical Therapy Treatment Patient Details Name: Manuel SCHEXNAYDER Sr. MRN: [id number] DOB: 05/10/1941 Today's Date: 04/06/2022   History of Present Illness Pt is an 81yo male presenting s/p R-THA, AA on 04/01/22.  pt with afib post op/rate poorly controlled and cardiology consulted. PMH: afib, DM, GERD, HTN, hx of prostate cancer s/p prostatectomy, OSA on CPAP, L-TKA 2009, L-THA 2018, L-reverse total shoulder 2021, R-TKA 2019.    PT Comments    POD 5 am session General Comments: AxO x 3 very pleasant.  Assisted OOB to amb required increased time.  General transfer comment: 25% VC's on proper hand placement as well as safety with turns.  Also asissted with a toilert transfer. General Gait Details: assisted with amb to and from bathroom as well as in hallway with 25% VC's on proper sequencing and safety with turns. Then practiced up/down stairs with 25% VC's on proper tech/sequencing.   HR increased from 108 at rest to highest 141 then back to 119.   Pt plans to D/C to home with Spouse when medically stable.   Recommendations for follow up therapy are one component of a multi-disciplinary discharge planning process, led by the attending physician.  Recommendations may be updated based on patient status, additional functional criteria and insurance authorization.  Follow Up Recommendations  Follow physician's recommendations for discharge plan and follow up therapies     Assistance Recommended at Discharge Frequent or constant Supervision/Assistance  Patient can return home with the following A little help with walking and/or transfers;A little help with bathing/dressing/bathroom;Assistance with cooking/housework;Assist for transportation;Help with stairs or ramp for entrance   Equipment Recommendations  None recommended by PT    Recommendations for Other Services       Precautions / Restrictions Precautions Precautions: Fall Restrictions Weight Bearing Restrictions: No RLE Weight  Bearing: Weight bearing as tolerated     Mobility  Bed Mobility               General bed mobility comments: OOB in recliner    Transfers Overall transfer level: Needs assistance Equipment used: Rolling walker (2 wheels) Transfers: Sit to/from Stand Sit to Stand: Supervision, Min guard           General transfer comment: 25% VC's on proper hand placement as well as safety with turns.  Also asissted with a toilert transfer.    Ambulation/Gait Ambulation/Gait assistance: Supervision, Min guard Gait Distance (Feet): 45 Feet Assistive device: Rolling walker (2 wheels) Gait Pattern/deviations: Step-to pattern, Decreased step length - right, Decreased step length - left, Decreased dorsiflexion - right, Decreased stance time - right Gait velocity: decreased     General Gait Details: assisted with amb to and from bathroom as well as in hallway with 25% VC's on proper sequencing and safety with turns.   Stairs Stairs: Yes Stairs assistance: Min guard, Min assist Stair Management: No rails, Step to pattern Number of Stairs: 2     Wheelchair Mobility    Modified Rankin (Stroke Patients Only)       Balance                                            Cognition Arousal/Alertness: Awake/alert Behavior During Therapy: WFL for tasks assessed/performed Overall Cognitive Status: Within Functional Limits for tasks assessed  General Comments: AxO x 3 very pleasant        Exercises      General Comments        Pertinent Vitals/Pain Pain Assessment Pain Assessment: 0-10 Pain Score: 5  Pain Location: right hip Pain Descriptors / Indicators: Grimacing, Guarding, Burning, Tightness, Operative site guarding Pain Intervention(s): Monitored during session, Premedicated before session, Repositioned, Ice applied    Home Living                          Prior Function            PT Goals  (current goals can now be found in the care plan section) Progress towards PT goals: Progressing toward goals    Frequency    7X/week      PT Plan Current plan remains appropriate    Co-evaluation              AM-PAC PT "6 Clicks" Mobility   Outcome Measure  Help needed turning from your back to your side while in a flat bed without using bedrails?: A Lot Help needed moving from lying on your back to sitting on the side of a flat bed without using bedrails?: A Lot Help needed moving to and from a bed to a chair (including a wheelchair)?: A Lot Help needed standing up from a chair using your arms (e.g., wheelchair or bedside chair)?: A Lot Help needed to walk in hospital room?: A Lot Help needed climbing 3-5 steps with a railing? : A Lot 6 Click Score: 12    End of Session Equipment Utilized During Treatment: Gait belt Activity Tolerance: Patient tolerated treatment well Patient left: in chair;with call bell/phone within reach;with chair alarm set Nurse Communication: Mobility status PT Visit Diagnosis: Pain;Difficulty in walking, not elsewhere classified (R26.2) Pain - Right/Left: Right Pain - part of body: Hip     Time: 5885-0277 PT Time Calculation (min) (ACUTE ONLY): 32 min  Charges:  $Gait Training: 8-22 mins $Therapeutic Activity: 8-22 mins                     Rica Koyanagi  PTA [place] Office M-F          [phone] Weekend pager [phone]

## 2022-04-07 LAB — GLUCOSE, CAPILLARY
Glucose-Capillary: 132 mg/dL — ABNORMAL HIGH (ref 70–99)
Glucose-Capillary: 182 mg/dL — ABNORMAL HIGH (ref 70–99)
Glucose-Capillary: 121 mg/dL — ABNORMAL HIGH (ref 70–99)
Glucose-Capillary: 160 mg/dL — ABNORMAL HIGH (ref 70–99)

## 2022-04-07 NOTE — Progress Notes (Signed)
Patient ID: Manuel Grippe Sr., male   DOB: 1941-03-01, 81 y.o.   MRN: [id number] Manuel Hunter continues to improve.  I really appreciate cardiology taking good care of him.  They have signed off at this point.  His wife is at the bedside.  He is sitting in the chair.  He looks more comfortable as well.  I have also talked with physical therapy.  They feel like he would be able to transition to home.  They will work with him today and the family with continued education and mobility.  I believe he will be able to be discharged to home tomorrow.

## 2022-04-07 NOTE — Progress Notes (Signed)
Physical Therapy Treatment Patient Details Name: Manuel Hunter. MRN: [id number] DOB: 03/23/1941 Today's Date: 04/07/2022   History of Present Illness Pt is an 81yo male presenting s/p R-THA, AA on 04/01/22.  pt with afib post op/rate poorly controlled and cardiology consulted. PMH: afib, DM, GERD, HTN, hx of prostate cancer s/p prostatectomy, OSA on CPAP, L-TKA 2009, L-THA 2018, L-reverse total shoulder 2021, R-TKA 2019.    PT Comments    POD # 6 am session Pt already OOB in recliner.  Assisted with amb in hallway ans practiced stairs with Spouse "hands on" instructed on safe handling using safety belt.  Then returned to room to perform some TE's following HEP handout.  Instructed on proper tech, freq as well as use of ICE.   Will see again this afternoon as pt plans to D/C tomorrow.   Recommendations for follow up therapy are one component of a multi-disciplinary discharge planning process, led by the attending physician.  Recommendations may be updated based on patient status, additional functional criteria and insurance authorization.  Follow Up Recommendations  Follow physician's recommendations for discharge plan and follow up therapies     Assistance Recommended at Discharge Frequent or constant Supervision/Assistance  Patient can return home with the following A little help with walking and/or transfers;A little help with bathing/dressing/bathroom;Assistance with cooking/housework;Assist for transportation;Help with stairs or ramp for entrance   Equipment Recommendations  None recommended by PT    Recommendations for Other Services       Precautions / Restrictions Precautions Precautions: Fall Restrictions Weight Bearing Restrictions: No RLE Weight Bearing: Weight bearing as tolerated     Mobility  Bed Mobility               General bed mobility comments: OOB in recliner    Transfers Overall transfer level: Needs assistance Equipment used: Rolling walker  (2 wheels) Transfers: Sit to/from Stand Sit to Stand: Supervision, Min guard   Step pivot transfers: Supervision, Min guard       General transfer comment: 25% VC's on proper hand placement as well as safety with turns.  Also asissted with a toilert transfer.    Ambulation/Gait Ambulation/Gait assistance: Supervision, Min guard Gait Distance (Feet): 40 Feet Assistive device: Rolling walker (2 wheels) Gait Pattern/deviations: Step-to pattern, Decreased step length - right, Decreased step length - left, Decreased dorsiflexion - right, Decreased stance time - right Gait velocity: decreased     General Gait Details: 25% VC's on proper walker to self distance as well as increased time.   Stairs Stairs: Yes Stairs assistance: Min guard, Min assist Stair Management: Step to pattern, Two rails, Forwards Number of Stairs: 2 General stair comments: with Spouse present "hands on" instructed on proper sequencing and safety   Wheelchair Mobility    Modified Rankin (Stroke Patients Only)       Balance                                            Cognition Arousal/Alertness: Awake/alert Behavior During Therapy: WFL for tasks assessed/performed Overall Cognitive Status: Within Functional Limits for tasks assessed                                 General Comments: AxO x 3 very pleasant        Exercises  Total  Hip Replacement TE's following HEP Handout 10 reps ankle pumps 05 reps knee presses 05 reps heel slides 05 reps SAQ's 05 reps ABD Instructed how to use a belt loop to assist  Followed by ICE     General Comments        Pertinent Vitals/Pain Pain Assessment Pain Assessment: Faces Faces Pain Scale: Hurts little more Pain Location: right hip after activity Pain Descriptors / Indicators: Grimacing, Guarding, Burning, Tightness, Operative site guarding Pain Intervention(s): Monitored during session, Premedicated before session,  Repositioned, Ice applied    Home Living                          Prior Function            PT Goals (current goals can now be found in the care plan section)      Frequency    7X/week      PT Plan Current plan remains appropriate    Co-evaluation              AM-PAC PT "6 Clicks" Mobility   Outcome Measure  Help needed turning from your back to your side while in a flat bed without using bedrails?: A Little Help needed moving from lying on your back to sitting on the side of a flat bed without using bedrails?: A Little Help needed moving to and from a bed to a chair (including a wheelchair)?: A Little Help needed standing up from a chair using your arms (e.g., wheelchair or bedside chair)?: A Little Help needed to walk in hospital room?: A Little Help needed climbing 3-5 steps with a railing? : A Little 6 Click Score: 18    End of Session Equipment Utilized During Treatment: Gait belt Activity Tolerance: Patient tolerated treatment well Patient left: in chair;with call bell/phone within reach;with chair alarm set Nurse Communication: Mobility status PT Visit Diagnosis: Pain;Difficulty in walking, not elsewhere classified (R26.2) Pain - Right/Left: Right Pain - part of body: Hip     Time: 1025-1050 PT Time Calculation (min) (ACUTE ONLY): 25 min  Charges:  $Gait Training: 8-22 mins $Therapeutic Activity: 8-22 mins                    Rica Koyanagi  PTA [place] [facility] M-F          [phone] Weekend pager [phone]

## 2022-04-07 NOTE — Progress Notes (Signed)
Physical Therapy Treatment Patient Details Name: Manuel Hunter. MRN: [id number] DOB: 11-30-1940 Today's Date: 04/07/2022   History of Present Illness Pt is an 81yo male presenting s/p R-THA, AA on 04/01/22.  pt with afib post op/rate poorly controlled and cardiology consulted. PMH: afib, DM, GERD, HTN, hx of prostate cancer s/p prostatectomy, OSA on CPAP, L-TKA 2009, L-THA 2018, L-reverse total shoulder 2021, R-TKA 2019.    PT Comments    POD 6 pm session Assisted OOB to amb to bathroom then in hallway.  Avg HR with activity was 86.  Assisted back to bed.  Pt plans to D/C tomorrow.   Recommendations for follow up therapy are one component of a multi-disciplinary discharge planning process, led by the attending physician.  Recommendations may be updated based on patient status, additional functional criteria and insurance authorization.  Follow Up Recommendations  Follow physician's recommendations for discharge plan and follow up therapies     Assistance Recommended at Discharge Frequent or constant Supervision/Assistance  Patient can return home with the following A little help with walking and/or transfers;A little help with bathing/dressing/bathroom;Assistance with cooking/housework;Assist for transportation;Help with stairs or ramp for entrance   Equipment Recommendations  None recommended by PT    Recommendations for Other Services       Precautions / Restrictions Precautions Precautions: Fall Restrictions Weight Bearing Restrictions: No RLE Weight Bearing: Weight bearing as tolerated     Mobility  Bed Mobility Overal bed mobility: Needs Assistance Bed Mobility: Supine to Sit, Sit to Supine     Supine to sit: Mod assist, HOB elevated Sit to supine: Mod assist   General bed mobility comments: Required Mod Assist to guide R LE as well as difficulty upper body due to ABD girth.  Assisted back to bed with supporting B LE up onto bed.    Transfers Overall  transfer level: Needs assistance Equipment used: Rolling walker (2 wheels) Transfers: Sit to/from Stand Sit to Stand: Supervision, Min guard   Step pivot transfers: Supervision, Min guard       General transfer comment: 25% VC's on proper hand placement as well as safety with turns.  Also asissted with a toilert transfer.    Ambulation/Gait Ambulation/Gait assistance: Supervision, Min guard Gait Distance (Feet): 57 Feet Assistive device: Rolling walker (2 wheels) Gait Pattern/deviations: Step-to pattern, Decreased step length - right, Decreased step length - left, Decreased dorsiflexion - right, Decreased stance time - right Gait velocity: decreased     General Gait Details: 25% VC's on proper walker to self distance as well as increased time.   Stairs Stairs: Yes Stairs assistance: Min guard, Min assist Stair Management: Step to pattern, Two rails, Forwards Number of Stairs: 2 General stair comments: with Spouse present "hands on" instructed on proper sequencing and safety   Wheelchair Mobility    Modified Rankin (Stroke Patients Only)       Balance                                            Cognition Arousal/Alertness: Awake/alert Behavior During Therapy: WFL for tasks assessed/performed Overall Cognitive Status: Within Functional Limits for tasks assessed                                 General Comments: AxO x 3 very pleasant  Exercises      General Comments        Pertinent Vitals/Pain Pain Assessment Pain Assessment: Faces Faces Pain Scale: Hurts little more Pain Location: right hip after activity Pain Descriptors / Indicators: Grimacing, Guarding, Burning, Tightness, Operative site guarding Pain Intervention(s): Monitored during session, Premedicated before session, Repositioned, Ice applied    Home Living                          Prior Function            PT Goals (current goals can now  be found in the care plan section)      Frequency    7X/week      PT Plan Current plan remains appropriate    Co-evaluation              AM-PAC PT "6 Clicks" Mobility   Outcome Measure  Help needed turning from your back to your side while in a flat bed without using bedrails?: A Little Help needed moving from lying on your back to sitting on the side of a flat bed without using bedrails?: A Little Help needed moving to and from a bed to a chair (including a wheelchair)?: A Little Help needed standing up from a chair using your arms (e.g., wheelchair or bedside chair)?: A Little Help needed to walk in hospital room?: A Little Help needed climbing 3-5 steps with a railing? : A Little 6 Click Score: 18    End of Session Equipment Utilized During Treatment: Gait belt Activity Tolerance: Patient tolerated treatment well Patient left: in chair;with call bell/phone within reach;with chair alarm set Nurse Communication: Mobility status PT Visit Diagnosis: Pain;Difficulty in walking, not elsewhere classified (R26.2) Pain - Right/Left: Right Pain - part of body: Hip     Time: 6195-0932 PT Time Calculation (min) (ACUTE ONLY): 26 min  Charges:  $Gait Training: 8-22 mins $Therapeutic Activity: 8-22 mins                     Rica Koyanagi  PTA [place] [facility] M-F          [phone] Weekend pager [phone]

## 2022-04-07 NOTE — Progress Notes (Signed)
Pt refused CPAP qhs.  Pt has his home machine at beside should he change his mind.  Pt advised that respiratory is available all night should he change his mind.

## 2022-04-07 NOTE — Plan of Care (Signed)
EF mildly reduced. Challenging to assess EF with afib. He does have elevated PASP. 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 Continue oral lasix. Rates are better controlled. Continue current diltiazem and metoprolol dosing. We will arrange follow-up for him with cardiology as an outpatient. We will sign off. Thank you for the consult

## 2022-04-08 LAB — GLUCOSE, CAPILLARY
Glucose-Capillary: 171 mg/dL — ABNORMAL HIGH (ref 70–99)
Glucose-Capillary: 124 mg/dL — ABNORMAL HIGH (ref 70–99)

## 2022-04-08 MED ORDER — METOPROLOL TARTRATE 100 MG PO TABS: 100.0000 mg | ORAL_TABLET | Freq: Two times a day (BID) | ORAL | 1 refills | Status: AC

## 2022-04-08 MED ORDER — DILTIAZEM HCL ER COATED BEADS 240 MG PO CP24: 240.0000 mg | ORAL_CAPSULE | Freq: Every day | ORAL | 1 refills | Status: AC

## 2022-04-08 MED ORDER — FUROSEMIDE 40 MG PO TABS: 40.0000 mg | ORAL_TABLET | Freq: Every day | ORAL | 1 refills | Status: AC

## 2022-04-08 NOTE — Progress Notes (Signed)
Patient ID: Manuel Grippe Sr., male   DOB: [birth year], 81 y.o.   MRN: [id number] The patient's heart rate seems to be under much better control.  He is having pain this morning with his right hip but made excellent progress with therapy yesterday.  Cardiology has signed off on him for follow-up as an outpatient.  I change his right hip dressing.  I am fine with him being discharged to home today.

## 2022-04-08 NOTE — Progress Notes (Signed)
Physical Therapy Treatment Patient Details Name: Manuel MAJCHRZAK Sr. MRN: [id number] DOB: 1941/04/23 Today's Date: 04/08/2022   History of Present Illness Pt is an 81yo male presenting s/p R-THA, AA on 04/01/22.  pt with afib post op/rate poorly controlled and cardiology consulted. PMH: afib, DM, GERD, HTN, hx of prostate cancer s/p prostatectomy, OSA on CPAP, L-TKA 2009, L-THA 2018, L-reverse total shoulder 2021, R-TKA 2019.    PT Comments    POD #7 am session Pt eager to D/C to home today.  Assisted OOB.  General bed mobility comments: Required Mod Assist to guide R LE as well as difficulty upper body due to ABD girth.  Pt stated he sleeps in a recliner at home. General Gait Details: 25% VC's on proper walker to self distance of 87 feet.  Practiced stairs.  Then returned to room to perform some TE's following HEP handout.  Instructed on proper tech, freq as well as use of ICE.   Addressed all mobility questions, discussed appropriate activity, educated on use of ICE.  Pt ready for D/C to home.   Recommendations for follow up therapy are one component of a multi-disciplinary discharge planning process, led by the attending physician.  Recommendations may be updated based on patient status, additional functional criteria and insurance authorization.  Follow Up Recommendations  Follow physician's recommendations for discharge plan and follow up therapies     Assistance Recommended at Discharge Frequent or constant Supervision/Assistance  Patient can return home with the following A little help with walking and/or transfers;A little help with bathing/dressing/bathroom;Assistance with cooking/housework;Assist for transportation;Help with stairs or ramp for entrance   Equipment Recommendations  None recommended by PT    Recommendations for Other Services       Precautions / Restrictions Precautions Precautions: Fall Restrictions Weight Bearing Restrictions: No RLE Weight Bearing: Weight  bearing as tolerated     Mobility  Bed Mobility Overal bed mobility: Needs Assistance Bed Mobility: Supine to Sit     Supine to sit: Mod assist, HOB elevated     General bed mobility comments: Required Mod Assist to guide R LE as well as difficulty upper body due to ABD girth.  Pt stated he sleeps in a recliner at home.    Transfers Overall transfer level: Needs assistance Equipment used: Rolling walker (2 wheels) Transfers: Sit to/from Stand Sit to Stand: Supervision           General transfer comment: 25% VC's on proper hand placement as well as safety with turns.    Ambulation/Gait Ambulation/Gait assistance: Supervision Gait Distance (Feet): 87 Feet Assistive device: Rolling walker (2 wheels) Gait Pattern/deviations: Step-to pattern, Decreased step length - right, Decreased step length - left, Decreased dorsiflexion - right, Decreased stance time - right Gait velocity: decreased     General Gait Details: 25% VC's on proper walker to self distance as well as increased time.   Stairs Stairs: Yes Stairs assistance: Supervision, Min guard Stair Management: Step to pattern, Two rails, Forwards Number of Stairs: 2 General stair comments: self able with correct sequencing   Wheelchair Mobility    Modified Rankin (Stroke Patients Only)       Balance                                            Cognition Arousal/Alertness: Awake/alert Behavior During Therapy: WFL for tasks assessed/performed Overall Cognitive Status: Within  Functional Limits for tasks assessed                                 General Comments: AxO x 3 very pleasant/talkative        Exercises  05 reps AP, knee presses    General Comments        Pertinent Vitals/Pain Pain Assessment Pain Assessment: 0-10 Pain Score: 6  Pain Location: right hip after activity Pain Descriptors / Indicators: Grimacing, Guarding, Burning, Tightness, Operative site  guarding Pain Intervention(s): Monitored during session, Repositioned, Ice applied, Premedicated before session    Home Living                          Prior Function            PT Goals (current goals can now be found in the care plan section) Progress towards PT goals: Progressing toward goals    Frequency    7X/week      PT Plan Current plan remains appropriate    Co-evaluation              AM-PAC PT "6 Clicks" Mobility   Outcome Measure  Help needed turning from your back to your side while in a flat bed without using bedrails?: A Little Help needed moving from lying on your back to sitting on the side of a flat bed without using bedrails?: A Little Help needed moving to and from a bed to a chair (including a wheelchair)?: A Little Help needed standing up from a chair using your arms (e.g., wheelchair or bedside chair)?: A Little Help needed to walk in hospital room?: A Little Help needed climbing 3-5 steps with a railing? : A Little 6 Click Score: 18    End of Session Equipment Utilized During Treatment: Gait belt Activity Tolerance: Patient tolerated treatment well Patient left: in chair;with call bell/phone within reach;with chair alarm set Nurse Communication: Mobility status (pt ready for D/C to home) PT Visit Diagnosis: Pain;Difficulty in walking, not elsewhere classified (R26.2) Pain - Right/Left: Right Pain - part of body: Hip     Time: 1010-1035 PT Time Calculation (min) (ACUTE ONLY): 25 min  Charges:  $Gait Training: 8-22 mins $Therapeutic Activity: 8-22 mins                     Rica Koyanagi  PTA [place] [facility] M-F          [phone] Weekend pager [phone]

## 2022-04-08 NOTE — Discharge Summary (Signed)
Patient ID: Manuel Grippe Sr. MRN: [id number] DOB/AGE: 09-12-1940 81 y.o.  Admit date: 04/01/2022 Discharge date: 04/08/2022  Admission Diagnoses:  Principal Problem:   Unilateral primary osteoarthritis, right hip Active Problems:   Status post total replacement of right hip   Discharge Diagnoses:  Same  Past Medical History:  Diagnosis Date   Arthritis    Atrial fibrillation ([place])    GERD (gastroesophageal reflux disease)    History of hiatal hernia    Hypertension    Lower extremity edema    Occasionally takes furosemide prn   Prostate cancer ([place]) 2007   tx with surgery   RLS (restless legs syndrome)    Rotator cuff tear    Right   Sleep apnea    CPAP nightly   Type 2 diabetes mellitus ([place])     Surgeries: Procedure(s): RIGHT TOTAL HIP ARTHROPLASTY ANTERIOR APPROACH on 04/01/2022   Consultants:   Discharged Condition: Improved  Hospital Course: [name] is an 81 y.o. male who was admitted 04/01/2022 for operative treatment ofUnilateral primary osteoarthritis, right hip. Patient has severe unremitting pain that affects sleep, daily activities, and work/hobbies. After pre-op clearance the patient was taken to the operating room on 04/01/2022 and underwent  Procedure(s): RIGHT TOTAL HIP ARTHROPLASTY ANTERIOR APPROACH.    Patient was given perioperative antibiotics:  Anti-infectives (From admission, onward)    Start     Dose/Rate Route Frequency Ordered Stop   04/01/22 1500  ceFAZolin (ANCEF) IVPB 1 g/50 mL premix        1 g 100 mL/hr over 30 Minutes Intravenous Every 6 hours 04/01/22 1241 04/01/22 2041   04/01/22 0630  ceFAZolin (ANCEF) IVPB 2g/100 mL premix        2 g 200 mL/hr over 30 Minutes Intravenous On call to O.R. 04/01/22 6834 04/01/22 0932        Patient was given sequential compression devices, early ambulation, and chemoprophylaxis to prevent DVT.  Patient benefited maximally from hospital stay.  His postoperative course was  complicated by A-fib with RVR.  He has longstanding A-fib but his rate increased with RVR.  Cardiology was consulted and made medication recommendations and adjustments.  By the day of discharge his heart rate under control and he is mobilizing well.  He is cleared PT.  Recent vital signs: Patient Vitals for the past 24 hrs:  BP Temp Temp src Pulse Resp SpO2  04/08/22 0526 136/84 98 F (36.7 C) -- (!) 53 17 97 %  04/08/22 0136 115/78 97.8 F (36.6 C) Oral 98 17 96 %  04/07/22 2124 124/83 97.8 F (36.6 C) Oral (!) 105 17 96 %  04/07/22 1655 107/87 98.2 F (36.8 C) -- (!) 101 17 98 %  04/07/22 1259 116/77 97.7 F (36.5 C) Oral 100 17 95 %  04/07/22 0911 136/83 97.7 F (36.5 C) Oral 91 17 98 %     Recent laboratory studies: No results for input(s): "WBC", "HGB", "HCT", "PLT", "NA", "K", "CL", "CO2", "BUN", "CREATININE", "GLUCOSE", "INR", "CALCIUM" in the last 72 hours.  Invalid input(s): "PT", "2"   Discharge Medications:   Allergies as of 04/08/2022       Reactions   Morphine Nausea And Vomiting        Medication List     STOP taking these medications    baclofen 10 MG tablet Commonly known as: LIORESAL   HYDROcodone-acetaminophen 10-325 MG tablet Commonly known as: NORCO   methocarbamol 500 MG tablet Commonly known as: ROBAXIN  phenytoin 100 MG ER capsule Commonly known as: DILANTIN       TAKE these medications    acetaminophen 500 MG tablet Commonly known as: TYLENOL Take 1,000 mg by mouth every 8 (eight) hours as needed for moderate pain.   apixaban 5 MG Tabs tablet Commonly known as: ELIQUIS Take 1 tablet (5 mg total) by mouth 2 (two) times daily. What changed:  medication strength how much to take   atorvastatin 10 MG tablet Commonly known as: LIPITOR Take 10 mg by mouth at bedtime.   diltiazem 120 MG 24 hr capsule Commonly known as: Cardizem CD Take 1 capsule (120 mg total) by mouth daily. What changed: Another medication with the same name  was added. Make sure you understand how and when to take each.   diltiazem 240 MG 24 hr capsule Commonly known as: CARDIZEM CD Take 1 capsule (240 mg total) by mouth daily. What changed: You were already taking a medication with the same name, and this prescription was added. Make sure you understand how and when to take each.   furosemide 20 MG tablet Commonly known as: LASIX Take 20 mg by mouth daily as needed for edema. What changed: Another medication with the same name was added. Make sure you understand how and when to take each.   furosemide 40 MG tablet Commonly known as: LASIX Take 1 tablet (40 mg total) by mouth daily. What changed: You were already taking a medication with the same name, and this prescription was added. Make sure you understand how and when to take each.   gabapentin 100 MG capsule Commonly known as: NEURONTIN Take 2 capsules (200 mg total) by mouth at bedtime.   Janumet XR 50-1000 MG Tb24 Generic drug: SitaGLIPtin-MetFORMIN HCl Take 1 tablet by mouth at bedtime.   lisinopril 2.5 MG tablet Commonly known as: ZESTRIL Take 2.5 mg by mouth daily.   metoprolol tartrate 100 MG tablet Commonly known as: LOPRESSOR Take 1 tablet (100 mg total) by mouth 2 (two) times daily.   oxyCODONE 5 MG immediate release tablet Commonly known as: Oxy IR/ROXICODONE Take 1-2 tablets (5-10 mg total) by mouth every 4 (four) hours as needed for moderate pain (pain score 4-6).   Ozempic (0.25 or 0.5 MG/DOSE) 2 MG/3ML Sopn Generic drug: Semaglutide(0.25 or 0.'5MG'$ /DOS) Inject 0.25 mg into the skin every Thursday.   pantoprazole 40 MG tablet Commonly known as: PROTONIX Take 40 mg by mouth every morning.   rOPINIRole 1 MG tablet Commonly known as: REQUIP Take 1 mg by mouth 3 (three) times daily with meals.   rOPINIRole 3 MG tablet Commonly known as: REQUIP Take 3 mg by mouth at bedtime.   Vitamin D (Ergocalciferol) 1.25 MG (50000 UNIT) Caps capsule Commonly known as:  DRISDOL Take 50,000 Units by mouth every Monday.               Durable Medical Equipment  (From admission, onward)           Start     Ordered   04/01/22 1242  DME 3 n 1  Once        04/01/22 1241   04/01/22 1242  DME Walker rolling  Once       Question Answer Comment  Walker: With 5 Inch Wheels   Patient needs a walker to treat with the following condition Status post total replacement of right hip      04/01/22 1241  Diagnostic Studies: ECHOCARDIOGRAM COMPLETE  Result Date: 04/06/2022    ECHOCARDIOGRAM REPORT   Patient Name:   OSAMU OLGUIN Date of Exam: 04/06/2022 Medical Rec #:  [id number]        Height:       66.0 in Accession #:    [id number]       Weight:       237.0 lb Date of Birth:  01-Jun-1941        BSA:          2.149 m Patient Age:    45 years         BP:           125/83 mmHg Patient Gender: M                HR:           111 bpm. Exam Location:  Inpatient Procedure: 2D Echo, Color Doppler and Cardiac Doppler Indications:    I48.91* Unspecified atrial fibrillation  History:        Patient has no prior history of Echocardiogram examinations.                 Arrythmias:Atrial Fibrillation; Risk Factors:Hypertension,                 Diabetes and Sleep Apnea.  Sonographer:    Raquel Sarna Senior RDCS Referring Phys: [place]  1. Left ventricular ejection fraction, by estimation, is 45 to 50%. The left ventricle has mildly decreased function. The left ventricle demonstrates global hypokinesis. Left ventricular diastolic function could not be evaluated.  2. Right ventricular systolic function was not well visualized. The right ventricular size is normal. There is mildly elevated pulmonary artery systolic pressure.  3. The mitral valve is normal in structure. Trivial mitral valve regurgitation. No evidence of mitral stenosis.  4. The aortic valve is tricuspid. There is mild calcification of the aortic valve. There is mild thickening of the aortic  valve. Aortic valve regurgitation is trivial. Aortic valve sclerosis is present, with no evidence of aortic valve stenosis.  5. The inferior vena cava is dilated in size with <50% respiratory variability, suggesting right atrial pressure of 15 mmHg. Comparison(s): No prior Echocardiogram. Conclusion(s)/Recommendation(s): Overall technically limited study, with poor windows and RVR throughout study. Mildly reduced LVEF, elevated RAP and RVSP. FINDINGS  Left Ventricle: Left ventricular ejection fraction, by estimation, is 45 to 50%. The left ventricle has mildly decreased function. The left ventricle demonstrates global hypokinesis. The left ventricular internal cavity size was normal in size. Suboptimal image quality limits for assessment of left ventricular hypertrophy. Left ventricular diastolic function could not be evaluated due to atrial fibrillation. Left ventricular diastolic function could not be evaluated. Right Ventricle: The right ventricular size is normal. Right vetricular wall thickness was not well visualized. Right ventricular systolic function was not well visualized. There is mildly elevated pulmonary artery systolic pressure. The tricuspid regurgitant velocity is 2.71 m/s, and with an assumed right atrial pressure of 15 mmHg, the estimated right ventricular systolic pressure is 37.3 mmHg. Left Atrium: Left atrial size was not well visualized. Right Atrium: Right atrial size was not well visualized. Pericardium: There is no evidence of pericardial effusion. Mitral Valve: The mitral valve is normal in structure. Trivial mitral valve regurgitation. No evidence of mitral valve stenosis. Tricuspid Valve: The tricuspid valve is normal in structure. Tricuspid valve regurgitation is mild . No evidence of tricuspid stenosis. Aortic Valve: The aortic valve is tricuspid.  There is mild calcification of the aortic valve. There is mild thickening of the aortic valve. Aortic valve regurgitation is trivial. Aortic  valve sclerosis is present, with no evidence of aortic valve stenosis. Pulmonic Valve: The pulmonic valve was not well visualized. Pulmonic valve regurgitation is trivial. No evidence of pulmonic stenosis. Aorta: The aortic root, ascending aorta and aortic arch are all structurally normal, with no evidence of dilitation or obstruction. Venous: The inferior vena cava is dilated in size with less than 50% respiratory variability, suggesting right atrial pressure of 15 mmHg. IAS/Shunts: The interatrial septum was not well visualized.  LEFT VENTRICLE PLAX 2D LVIDd:         5.10 cm LVIDs:         3.90 cm LV PW:         0.90 cm LV IVS:        0.90 cm LVOT diam:     1.90 cm LV SV:         26 LV SV Index:   12 LVOT Area:     2.84 cm  RIGHT VENTRICLE RV S prime:     7.72 cm/s TAPSE (M-mode): 1.3 cm LEFT ATRIUM             Index        RIGHT ATRIUM           Index LA diam:        4.80 cm 2.23 cm/m   RA Area:     20.20 cm LA Vol (A2C):   64.4 ml 29.96 ml/m  RA Volume:   46.70 ml  21.73 ml/m LA Vol (A4C):   63.8 ml 29.68 ml/m LA Biplane Vol: 65.0 ml 30.24 ml/m  AORTIC VALVE LVOT Vmax:   67.87 cm/s LVOT Vmean:  44.700 cm/s LVOT VTI:    0.091 m  AORTA Ao Root diam: 3.60 cm Ao Asc diam:  3.20 cm TRICUSPID VALVE TR Peak grad:   29.4 mmHg TR Vmax:        271.00 cm/s  SHUNTS Systemic VTI:  0.09 m Systemic Diam: 1.90 cm Buford Dresser MD Electronically signed by Buford Dresser MD Signature Date/Time: 04/06/2022/5:29:49 PM    Final    DG Pelvis Portable  Result Date: 04/01/2022 CLINICAL DATA:  Postop right hip replacement EXAM: PORTABLE PELVIS 1-2 VIEWS COMPARISON:  Pelvis 05/08/2017 FINDINGS: Right hip replacement in satisfactory position and alignment. No fracture or complication. Pre-existing left hip replacement without complication Surgical clips in the pelvis.  Penile implant. IMPRESSION: Satisfactory right hip replacement Electronically Signed   By: Franchot Gallo M.D.   On: 04/01/2022 11:03   DG HIP  UNILAT WITH PELVIS 1V RIGHT  Result Date: 04/01/2022 CLINICAL DATA:  Right hip replacement. EXAM: DG HIP (WITH OR WITHOUT PELVIS) 1V RIGHT; DG C-ARM 1-60 MIN-NO REPORT Radiation exposure index: 4.427 mGy. COMPARISON:  None Available. FINDINGS: Three intraoperative fluoroscopic images were obtained of the right hip. The right femoral and acetabular components are well situated. IMPRESSION: Fluoroscopic guidance provided during right total hip arthroplasty. Electronically Signed   By: Marijo Conception M.D.   On: 04/01/2022 10:16   DG C-Arm 1-60 Min-No Report  Result Date: 04/01/2022 CLINICAL DATA:  Right hip replacement. EXAM: DG HIP (WITH OR WITHOUT PELVIS) 1V RIGHT; DG C-ARM 1-60 MIN-NO REPORT Radiation exposure index: 4.427 mGy. COMPARISON:  None Available. FINDINGS: Three intraoperative fluoroscopic images were obtained of the right hip. The right femoral and acetabular components are well situated. IMPRESSION: Fluoroscopic guidance provided  during right total hip arthroplasty. Electronically Signed   By: Marijo Conception M.D.   On: 04/01/2022 10:16   DG C-Arm 1-60 Min-No Report  Result Date: 04/01/2022 Fluoroscopy was utilized by the requesting physician.  No radiographic interpretation.   DG Chest Port 1 View  Result Date: 03/14/2022 CLINICAL DATA:  AFib, elevated heart rate. EXAM: PORTABLE CHEST 1 VIEW COMPARISON:  08/26/2019 FINDINGS: The heart size and mediastinal contours are stable. Lung volumes are low with atelectasis at the lung bases. No consolidation, effusion, or pneumothorax. No acute osseous abnormality. IMPRESSION: Low lung volumes with atelectasis or infiltrate at the lung bases. Electronically Signed   By: Brett Fairy M.D.   On: 03/14/2022 21:56   XR Thoracic Spine 2 View  Result Date: 03/11/2022 3 views of the thoracic spine including AP, lateral and swimmer's views were ordered and reviewed by myself.  X-rays demonstrate no significant scoliosis.  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  Evidence of right shoulder arthroplasty as well as surgical hardware from likely prior C5-C7 ACDF.   Disposition: Discharge disposition: 01-Home or Self Care          Follow-up Information     [name], MD. Schedule an appointment as soon as possible for a visit in 2 week(s).   Specialty: Orthopedic Surgery Contact information: [address] [place] [address] [place], [place] Follow up.   Specialty: [place] Why: to provide home physical therapy visits Contact information: [place] [address] [phone]                  Signed: [name] 04/08/2022, 7:16 AM

## 2022-04-13 ENCOUNTER — Telehealth: Payer: Self-pay | Admitting: Orthopaedic Surgery

## 2022-04-13 NOTE — Telephone Encounter (Signed)
Pt has appt tomorrow @ 2:45

## 2022-04-13 NOTE — Telephone Encounter (Signed)
Anderson Malta (PT) from [facility] called with updates. Pt has sever pains in right hip 7 out of 10 pain level. Pt also has sever swelling of lower right extremity. Please call Anderson Malta at [phone].

## 2022-04-13 NOTE — Telephone Encounter (Signed)
Received call from [place] case manager with [facility] advised today was the first start of care for the patient. The number to contact Manuel Hunter is [phone]

## 2022-04-14 ENCOUNTER — Encounter: Payer: Self-pay | Admitting: Orthopaedic Surgery

## 2022-04-14 ENCOUNTER — Ambulatory Visit (INDEPENDENT_AMBULATORY_CARE_PROVIDER_SITE_OTHER): Payer: Medicare HMO | Admitting: Orthopaedic Surgery

## 2022-04-14 ENCOUNTER — Encounter: Payer: Medicare HMO | Admitting: Orthopaedic Surgery

## 2022-04-14 DIAGNOSIS — Z96641 Presence of right artificial hip joint: Secondary | ICD-10-CM

## 2022-04-14 MED ORDER — OXYCODONE HCL 5 MG PO TABS: 5.0000 mg | ORAL_TABLET | ORAL | 0 refills | Status: AC | PRN

## 2022-04-14 NOTE — Progress Notes (Signed)
Mr. Rill comes in today 2 weeks after a hip replacement.  His postoperative course was complicated by his chronic A-fib that went into RVR.  He is having a very hard time getting over surgery.  He had significant debilitating pain prior to surgery as well that came on acutely.  I did find an arthritic hip and we did replace his right hip.  His right hip incision looks fine.  I did remove the staples and place Steri-Strips.  He does have a hematoma secondary to him being on Eliquis.  He has not seen his cardiologist yet and he does need to get into his cardiologist.  I would like to see him back in just 2 weeks to see how he is doing from mobility standpoint.  I do expect him to have significant foot and ankle swelling.  There is really nothing I would do about this other than him continue to wear his compressive garments and elevating is much as he can.  His wife did let him know he has not been elevating his legs.  His swelling was down significantly in the hospital.  We did have him in the hospital for a week after surgery.

## 2022-04-19 ENCOUNTER — Telehealth: Payer: Self-pay

## 2022-04-19 NOTE — Telephone Encounter (Signed)
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  Please advise.

## 2022-04-19 NOTE — Telephone Encounter (Signed)
I called and advised patient.

## 2022-04-25 ENCOUNTER — Telehealth: Payer: Self-pay | Admitting: Orthopaedic Surgery

## 2022-04-25 NOTE — Telephone Encounter (Signed)
[facility]y [facility] [phone] called about the patient Manuel Hunter lot of swelling in legs and was going to [employer] and he was going to drive and did  not k now if Dr Ninfa Linden gave him permisson to drive. Pt will be at [employer] for over 5 hours. Nicole Kindred advise for patient to stay home and the patient said he was going in anyway. Nicole Kindred wanted to let Dr Ninfa Linden know just incase something happen

## 2022-04-28 ENCOUNTER — Ambulatory Visit (INDEPENDENT_AMBULATORY_CARE_PROVIDER_SITE_OTHER): Payer: Medicare HMO | Admitting: Orthopaedic Surgery

## 2022-04-28 ENCOUNTER — Encounter: Payer: Self-pay | Admitting: Orthopaedic Surgery

## 2022-04-28 DIAGNOSIS — Z96641 Presence of right artificial hip joint: Secondary | ICD-10-CM

## 2022-04-28 NOTE — Progress Notes (Signed)
The patient comes in today at 4 weeks status post a right total hip arthroplasty.  He is finally making progress in terms of decreasing pain and improving his mobility.  He is already off of pain medications finally.  He is still needing to walk with a walker and has home therapy coming to his house.  On exam his right hip is moving much more smoothly and fluidly.  His right hip incision looks good.  His swelling is less in his feet and ankles and he has been compliant with wearing compressive garments.  His postoperative course was complicated by A-fib with RVR.  He does have chronic A-fib.  He has been seeing his cardiologist.  I will see him back in 4 weeks to see how is doing overall but no x-rays are needed.  All questions and concerns were answered and addressed.

## 2022-04-29 ENCOUNTER — Ambulatory Visit: Payer: Medicare HMO | Admitting: Internal Medicine

## 2022-04-30 ENCOUNTER — Other Ambulatory Visit: Payer: Self-pay | Admitting: Orthopaedic Surgery

## 2022-05-02 NOTE — Telephone Encounter (Signed)
Needs to be prescribed by the patient's cardiologist.

## 2022-05-05 ENCOUNTER — Telehealth: Payer: Self-pay

## 2022-05-05 ENCOUNTER — Other Ambulatory Visit: Payer: Self-pay

## 2022-05-05 DIAGNOSIS — Z96641 Presence of right artificial hip joint: Secondary | ICD-10-CM

## 2022-05-05 NOTE — Telephone Encounter (Signed)
Patient called and stated that [facility] PT was recommending OP PT.  He wants to go to [facility] [address], [address] [place].  He would also like some more compression hose and asked if Caryl Pina can send to him.  Please call him.[phone]

## 2022-05-06 ENCOUNTER — Telehealth: Payer: Self-pay | Admitting: Orthopaedic Surgery

## 2022-05-06 DIAGNOSIS — Z96641 Presence of right artificial hip joint: Secondary | ICD-10-CM

## 2022-05-06 NOTE — Telephone Encounter (Signed)
Sent patient a MyChart message

## 2022-05-06 NOTE — Telephone Encounter (Signed)
Manuel Hunter (PT) from [facility]. Discharged pt from physical therapy and also requesting a referral for outpatient physical therapy. Pt would like referral sent to [facility] [address] phone number is [phone]

## 2022-05-10 ENCOUNTER — Ambulatory Visit: Payer: Medicare HMO | Attending: Orthopaedic Surgery | Admitting: Physical Therapy

## 2022-05-10 ENCOUNTER — Encounter: Payer: Self-pay | Admitting: Physical Therapy

## 2022-05-10 ENCOUNTER — Other Ambulatory Visit: Payer: Self-pay

## 2022-05-10 DIAGNOSIS — Z96641 Presence of right artificial hip joint: Secondary | ICD-10-CM | POA: Insufficient documentation

## 2022-05-10 DIAGNOSIS — M6281 Muscle weakness (generalized): Secondary | ICD-10-CM | POA: Diagnosis not present

## 2022-05-10 DIAGNOSIS — R600 Localized edema: Secondary | ICD-10-CM | POA: Insufficient documentation

## 2022-05-10 DIAGNOSIS — M25651 Stiffness of right hip, not elsewhere classified: Secondary | ICD-10-CM | POA: Diagnosis not present

## 2022-05-10 DIAGNOSIS — M25551 Pain in right hip: Secondary | ICD-10-CM | POA: Insufficient documentation

## 2022-05-10 NOTE — Therapy (Signed)
OUTPATIENT PHYSICAL THERAPY LOWER EXTREMITY EVALUATION   Patient Name: Manuel HAMME Sr. MRN: [id number] DOB:Apr 21, 1941, 81 y.o., male Today's Date: 05/10/2022   PT End of Session - 05/10/22 1104     Visit Number 1    Number of Visits 16    Date for PT Re-Evaluation 07/05/22    Authorization Type [employer]    Authorization - Number of Visits 10    PT Start Time 1100    PT Stop Time 1145    PT Time Calculation (min) 45 min    Activity Tolerance Patient tolerated treatment well    Behavior During Therapy WFL for tasks assessed/performed             Past Medical History:  Diagnosis Date   Arthritis    Atrial fibrillation (HCC)    GERD (gastroesophageal reflux disease)    History of hiatal hernia    Hypertension    Lower extremity edema    Occasionally takes furosemide prn   Prostate cancer ([place]) 2007   tx with surgery   RLS (restless legs syndrome)    Rotator cuff tear    Right   Sleep apnea    CPAP nightly   Type 2 diabetes mellitus ([place])    Past Surgical History:  Procedure Laterality Date   FINGER ARTHROPLASTY Left 03/10/2017   Procedure: Left index finger metacarpal phalangeal arthroplasty;  Surgeon: Roseanne Kaufman, MD;  Location: [facility];  Service: Orthopedics;  Laterality: Left;  90 mins   JOINT REPLACEMENT     KNEE SURGERY Bilateral    left knee replacment  2009   MANDIBLE FRACTURE SURGERY     left 2 titanium plates and 10 scres   NECK SURGERY     cervical with bone graft   PROSTATE SURGERY     prostatctomy   REVERSE SHOULDER ARTHROPLASTY Left 02/14/2020   Procedure: REVERSE SHOULDER ARTHROPLASTY;  Surgeon: Netta Cedars, MD;  Location: [facility];  Service: Orthopedics;  Laterality: Left;  Interscalene block   ROTATOR CUFF REPAIR     x 2 right x 2 left   TOTAL HIP ARTHROPLASTY Left 09/13/2016   Procedure: LEFT TOTAL HIP ARTHROPLASTY ANTERIOR APPROACH;  Surgeon: [name], MD;  Location: [place];  Service: Orthopedics;   Laterality: Left;   TOTAL HIP ARTHROPLASTY Right 04/01/2022   Procedure: RIGHT TOTAL HIP ARTHROPLASTY ANTERIOR APPROACH;  Surgeon: [name], MD;  Location: [facility];  Service: Orthopedics;  Laterality: Right;   TOTAL KNEE ARTHROPLASTY Right 03/23/2018   Procedure: RIGHT TOTAL KNEE ARTHROPLASTY;  Surgeon: [name], MD;  Location: [facility];  Service: Orthopedics;  Laterality: Right;   Patient Active Problem List   Diagnosis Date Noted   Status post total replacement of right hip 04/01/2022   Unilateral primary osteoarthritis, right hip 12/27/2021   H/O total shoulder replacement, left 02/14/2020   Unilateral primary osteoarthritis, right knee 03/23/2018   Status post total knee replacement, right 03/23/2018   Unilateral primary osteoarthritis, left hip 09/13/2016   Status post left hip replacement 09/13/2016    PCP: n/a  REFERRING PROVIDER: Jean Rosenthal MD  REFERRING DIAG: [phone] (ICD-10-CM) - Status post total replacement of right hip  THERAPY DIAG:  Pain in right hip  Stiffness of right hip, not elsewhere classified  Localized edema  Muscle weakness (generalized)  Rationale for Evaluation and Treatment Rehabilitation  ONSET DATE: 04/14/22  SUBJECTIVE:   SUBJECTIVE STATEMENT: Pt states he had R hip surgery 04/14/22. Pt has been  receiving HHPT. Pt had last visit last week. Pt able to walk very short distance without RW. Pt notes he hasn't been sleeping in the bed -- has been in the recliner.   PERTINENT HISTORY: LTHA, bilat TKA, L TSA  PAIN:  Are you having pain? Yes: NPRS scale: 6 to 7/10 Pain location: R hip Pain description: stabbing pain Aggravating factors: Walking Relieving factors: Elevate, ice  PRECAUTIONS: None  WEIGHT BEARING RESTRICTIONS: No  FALLS:  Has patient fallen in last 6 months? No  LIVING ENVIRONMENT: Lives with: lives with their spouse Lives in: House/apartment Stairs: Yes: External: 2 steps; none Has  following equipment at home: Single point cane, Walker - 2 wheeled, and Grab bars  OCCUPATION: Retired  PLOF: Independent  PATIENT GOALS: Walking without pain   OBJECTIVE:   DIAGNOSTIC FINDINGS: See chart  PATIENT SURVEYS:  FOTO not assessed  COGNITION: Overall cognitive status: Within functional limits for tasks assessed     SENSATION: WFL  EDEMA: Mild  MUSCLE LENGTH: Hamstrings: Right 60 deg; Left 60 deg Thomas test: Right -20 deg; Left 0 deg  POSTURE: No Significant postural limitations  PALPATION: Increased muscle spasm and tightness grossly throughout R hip -- glutes, hip flexors, hamstrings, quads  LOWER EXTREMITY ROM:  Active ROM Right eval Left eval  Hip flexion 100 110  Hip extension -20 10  Hip abduction 30   Hip adduction    Hip internal rotation    Hip external rotation    Knee flexion    Knee extension    Ankle dorsiflexion    Ankle plantarflexion    Ankle inversion    Ankle eversion     (Blank rows = not tested)  LOWER EXTREMITY MMT:  MMT Right eval Left eval  Hip flexion 4+ 5  Hip extension 4+ 5  Hip abduction 4 5  Hip adduction 4 5  Hip internal rotation    Hip external rotation    Knee flexion 5 5  Knee extension 4+ 5  Ankle dorsiflexion    Ankle plantarflexion    Ankle inversion    Ankle eversion     (Blank rows = not tested)  LOWER EXTREMITY SPECIAL TESTS:  Did not assess  FUNCTIONAL TESTS:  5 times sit to stand: 19 sec Timed up and go (TUG): 13 sec  GAIT: Distance walked: 88' Assistive device utilized: Environmental consultant - 2 wheeled Level of assistance: Modified independence Comments: Antalgic on R, R knee slightly flexed, trunk flexion with R stance phase   TODAY'S TREATMENT                                                                          DATE:05/10/22 THEREX Hip flexor stretch sitting and standing x30 sec each Hamstring stretch sitting and standing x 30 sec each Figure 4 stretch supine x30 sec Piriformis  stretch supine x 30 sec  MANUAL THERAPY IASTM massage roller R quad and hip flexor Scar massage R hip  PATIENT EDUCATION:  Education details: Exam findings, POC, HEP Person educated: Patient Education method: Explanation, Demonstration, and Handouts Education comprehension: verbalized understanding, returned demonstration, and needs further education  HOME EXERCISE PROGRAM: Access Code: [email or web address] URL: [email or web address] Date: 05/10/2022 Prepared by: Estill Bamberg April Thurnell Garbe  Exercises - Supine Figure 4 Piriformis Stretch  - 1 x daily - 7 x weekly - 2 sets - 30 sec hold - Supine Piriformis Stretch with Foot on Ground  - 1 x daily - 7 x weekly - 2 sets - 30 sec hold - Seated Hamstring Stretch  - 1 x daily - 7 x weekly - 2 sets - 30 sec hold - Standing Hamstring Stretch with Step  - 1 x daily - 7 x weekly - 2 sets - 30 sec hold - Hip Flexor Stretch on Step  - 1 x daily - 7 x weekly - 2 sets - 30 sec hold - Seated Hip Flexor Stretch  - 1 x daily - 7 x weekly - 2 sets - 30 sec hold  Patient Education - Scar Massage  ASSESSMENT:  CLINICAL IMPRESSION: Patient is a 81 y.o. M who was seen today for physical therapy evaluation and treatment s/p R THA on 04/14/22. On assessment, pt's greatest deficit is with hip ROM, increased quad, hip flexor, and hamstring tightness/muscle spasm resulting in pain with weight bearing, mild R hip weakness, and limited activity tolerance affecting pt's ability to amb ind. Pt is currently unable to extend knee completely due to hip flexor tightness. Pt would highly benefit from PT to address these issues for return to PLOF.   OBJECTIVE IMPAIRMENTS: Abnormal gait, decreased activity tolerance, decreased balance, decreased endurance, decreased mobility, difficulty walking, decreased ROM, decreased strength, increased edema, increased fascial restrictions, increased muscle spasms, impaired flexibility, improper body mechanics, postural dysfunction,  and pain.   ACTIVITY LIMITATIONS: standing, stairs, and locomotion level  PARTICIPATION LIMITATIONS: driving, shopping, community activity, and yard work  PERSONAL FACTORS: Age, Fitness, Past/current experiences, and Time since onset of injury/illness/exacerbation are also affecting patient's functional outcome.   REHAB POTENTIAL: Good  CLINICAL DECISION MAKING: Stable/uncomplicated  EVALUATION COMPLEXITY: Low   GOALS: Goals reviewed with patient? Yes  SHORT TERM GOALS: Target date: 06/07/2022   Pt will be ind with initial HEP Baseline: Goal status: INITIAL  2.  Pt will have improved hip ext to at least neutral Baseline: 20 deg away from neutral, unable to extend knee fully due to hip flexor tightness Goal status: INITIAL    LONG TERM GOALS: Target date: 07/05/2022   Pt will be ind with progression and advancement of HEP Baseline:  Goal status: INITIAL  2.  Pt will demo L = R hip ROM Baseline:  Goal status: INITIAL  3.  Pt will improve 5 x STS to </=13 sec to demo improved functional LE strength Baseline:  Goal status: INITIAL  4.  Pt will be able to amb ind >/=1000' for community level mobility Baseline:  Goal status: INITIAL  5.  Pt will report </=2/10 pain with all activities Baseline:  Goal status: INITIAL    PLAN:  PT FREQUENCY: 2x/week  PT DURATION: 8 weeks  PLANNED INTERVENTIONS: Therapeutic exercises, Therapeutic activity, Neuromuscular re-education, Balance training, Gait training, Patient/Family education, Self Care, Joint mobilization, Aquatic Therapy, Dry Needling, Electrical stimulation, Cryotherapy, Moist heat, scar mobilization, Taping, Ultrasound, Ionotophoresis '4mg'$ /ml Dexamethasone, Manual therapy, and Re-evaluation  PLAN FOR NEXT SESSION: Assess response to HEP. Manual work/TPDN as indicated for R hip/thigh. ROM and strengthening for hip/knee.   Manuel Hunter, PT, DPT 05/10/2022, 12:54 PM

## 2022-05-17 ENCOUNTER — Ambulatory Visit: Payer: Medicare HMO | Admitting: Physical Therapy

## 2022-05-19 ENCOUNTER — Encounter: Payer: Self-pay | Admitting: Physical Therapy

## 2022-05-19 ENCOUNTER — Ambulatory Visit: Payer: Medicare HMO | Attending: Orthopaedic Surgery | Admitting: Physical Therapy

## 2022-05-19 DIAGNOSIS — M25651 Stiffness of right hip, not elsewhere classified: Secondary | ICD-10-CM | POA: Diagnosis present

## 2022-05-19 DIAGNOSIS — M6281 Muscle weakness (generalized): Secondary | ICD-10-CM

## 2022-05-19 DIAGNOSIS — M25551 Pain in right hip: Secondary | ICD-10-CM | POA: Diagnosis not present

## 2022-05-19 DIAGNOSIS — R600 Localized edema: Secondary | ICD-10-CM | POA: Diagnosis present

## 2022-05-19 NOTE — Therapy (Signed)
OUTPATIENT PHYSICAL THERAPY TREATMENT   Patient Name: Manuel LUDVIGSEN Sr. MRN: [id number] DOB:06-03-1941, 81 y.o., male Today's Date: 05/19/2022   PT End of Session - 05/19/22 1315     Visit Number 2    Number of Visits 16    Date for PT Re-Evaluation 07/05/22    Authorization Type [employer]    Authorization - Number of Visits 10    PT Start Time 5361    PT Stop Time 1400    PT Time Calculation (min) 45 min    Activity Tolerance Patient tolerated treatment well    Behavior During Therapy WFL for tasks assessed/performed              Past Medical History:  Diagnosis Date   Arthritis    Atrial fibrillation (HCC)    GERD (gastroesophageal reflux disease)    History of hiatal hernia    Hypertension    Lower extremity edema    Occasionally takes furosemide prn   Prostate cancer ([place]) 2007   tx with surgery   RLS (restless legs syndrome)    Rotator cuff tear    Right   Sleep apnea    CPAP nightly   Type 2 diabetes mellitus ([place])    Past Surgical History:  Procedure Laterality Date   FINGER ARTHROPLASTY Left 03/10/2017   Procedure: Left index finger metacarpal phalangeal arthroplasty;  Surgeon: Roseanne Kaufman, MD;  Location: [facility];  Service: Orthopedics;  Laterality: Left;  90 mins   JOINT REPLACEMENT     KNEE SURGERY Bilateral    left knee replacment  2009   MANDIBLE FRACTURE SURGERY     left 2 titanium plates and 10 scres   NECK SURGERY     cervical with bone graft   PROSTATE SURGERY     prostatctomy   REVERSE SHOULDER ARTHROPLASTY Left 02/14/2020   Procedure: REVERSE SHOULDER ARTHROPLASTY;  Surgeon: Netta Cedars, MD;  Location: [facility];  Service: Orthopedics;  Laterality: Left;  Interscalene block   ROTATOR CUFF REPAIR     x 2 right x 2 left   TOTAL HIP ARTHROPLASTY Left 09/13/2016   Procedure: LEFT TOTAL HIP ARTHROPLASTY ANTERIOR APPROACH;  Surgeon: [name], MD;  Location: [facility];  Service: Orthopedics;  Laterality:  Left;   TOTAL HIP ARTHROPLASTY Right 04/01/2022   Procedure: RIGHT TOTAL HIP ARTHROPLASTY ANTERIOR APPROACH;  Surgeon: [name], MD;  Location: [facility];  Service: Orthopedics;  Laterality: Right;   TOTAL KNEE ARTHROPLASTY Right 03/23/2018   Procedure: RIGHT TOTAL KNEE ARTHROPLASTY;  Surgeon: [name], MD;  Location: [facility];  Service: Orthopedics;  Laterality: Right;   Patient Active Problem List   Diagnosis Date Noted   Status post total replacement of right hip 04/01/2022   Unilateral primary osteoarthritis, right hip 12/27/2021   H/O total shoulder replacement, left 02/14/2020   Unilateral primary osteoarthritis, right knee 03/23/2018   Status post total knee replacement, right 03/23/2018   Unilateral primary osteoarthritis, left hip 09/13/2016   Status post left hip replacement 09/13/2016    PCP: n/a  REFERRING PROVIDER: Jean Rosenthal MD  REFERRING DIAG: [phone] (ICD-10-CM) - Status post total replacement of right hip  THERAPY DIAG:  Pain in right hip  Localized edema  Muscle weakness (generalized)  Stiffness of right hip, not elsewhere classified  Rationale for Evaluation and Treatment Rehabilitation  ONSET DATE: 04/14/22 R THA anterior approach  SUBJECTIVE:   SUBJECTIVE STATEMENT: Pt states exercises are going okay. States he  graduated himself to the cane.   PERTINENT HISTORY: LTHA, bilat TKA, L TSA  PAIN:  Are you having pain? Yes: NPRS scale: 0/10 Pain location: R hip Pain description: stabbing pain Aggravating factors: Walking Relieving factors: Elevate, ice  PRECAUTIONS: None  WEIGHT BEARING RESTRICTIONS: No  FALLS:  Has patient fallen in last 6 months? No  LIVING ENVIRONMENT: Lives with: lives with their spouse Lives in: House/apartment Stairs: Yes: External: 2 steps; none Has following equipment at home: Single point cane, Walker - 2 wheeled, and Grab bars  OCCUPATION: Retired  PLOF: Independent  PATIENT  GOALS: Walking without pain   OBJECTIVE:   PATIENT SURVEYS:  FOTO not assessed  COGNITION: Overall cognitive status: Within functional limits for tasks assessed     SENSATION: WFL  MUSCLE LENGTH: Hamstrings: Right 60 deg; Left 60 deg Thomas test: Right -20 deg; Left 0 deg  POSTURE: No Significant postural limitations  PALPATION: Increased muscle spasm and tightness grossly throughout R hip -- glutes, hip flexors, hamstrings, quads  LOWER EXTREMITY ROM:  Active ROM Right eval Left eval Right 11/2  Hip flexion 100 110 110  Hip extension -20 10 0  Hip abduction 30    Hip adduction     Hip internal rotation     Hip external rotation     Knee flexion     Knee extension     Ankle dorsiflexion     Ankle plantarflexion     Ankle inversion     Ankle eversion      (Blank rows = not tested)  LOWER EXTREMITY MMT:  MMT Right eval Left eval  Hip flexion 4+ 5  Hip extension 4+ 5  Hip abduction 4 5  Hip adduction 4 5  Hip internal rotation    Hip external rotation    Knee flexion 5 5  Knee extension 4+ 5  Ankle dorsiflexion    Ankle plantarflexion    Ankle inversion    Ankle eversion     (Blank rows = not tested)  FUNCTIONAL TESTS:   Eval: 5 times sit to stand: 19 sec Timed up and go (TUG): 13 sec  GAIT: Distance walked: 56' Assistive device utilized: Single point cane Level of assistance: Modified independence Comments: normal reciprocal pattern   Treatment:                                                DATE: 05/19/22 Therapeutic Exercise: Nustep L5 x 5 min Supine (head propped on wedge) Hip flexor stretch x30 sec Quad stretch with strap 2x30 sec Hamstring stretch with strap x30 sec SAQ red TB x10x3 sec, green TB x10x3 sec SLR green TB x10 Clamshell green TB 2x10 Marching green TB 2X10 Standing Hip abd green TB 2x10 Hip ext green TB 2x10 Hamstring green TB 2x10 Side step up 6" 2x10 Manual Therapy: Scar massage hip flexor IASTM quad and hip  flexor Therapeutic Activity: Ascend/descend stairs 4x5 Gait Training: X2 laps with SPC SBA. Cues to keep cane in L hand to off load R LE X10' no a/d but with increased pain  TREATMENT  DATE:05/10/22 THEREX Hip flexor stretch sitting and standing x30 sec each Hamstring stretch sitting and standing x 30 sec each Figure 4 stretch supine x30 sec Piriformis stretch supine x 30 sec  MANUAL THERAPY IASTM massage roller R quad and hip flexor Scar massage R hip  PATIENT EDUCATION:  Education details: Exam findings, POC, HEP Person educated: Patient Education method: Explanation, Demonstration, and Handouts Education comprehension: verbalized understanding, returned demonstration, and needs further education  HOME EXERCISE PROGRAM: Access Code: [email or web address] URL: [email or web address] Date: 05/10/2022 Prepared by: Estill Bamberg April Thurnell Garbe  Exercises - Supine Figure 4 Piriformis Stretch  - 1 x daily - 7 x weekly - 2 sets - 30 sec hold - Supine Piriformis Stretch with Foot on Ground  - 1 x daily - 7 x weekly - 2 sets - 30 sec hold - Seated Hamstring Stretch  - 1 x daily - 7 x weekly - 2 sets - 30 sec hold - Standing Hamstring Stretch with Step  - 1 x daily - 7 x weekly - 2 sets - 30 sec hold - Hip Flexor Stretch on Step  - 1 x daily - 7 x weekly - 2 sets - 30 sec hold - Seated Hip Flexor Stretch  - 1 x daily - 7 x weekly - 2 sets - 30 sec hold  Patient Education - Scar Massage  ASSESSMENT:  CLINICAL IMPRESSION: Much improved hip ROM from eval. Pt is demonstrating improved gait pattern and mobility. Able to amb with SPC but needs cues to use L hand. Worked on stair negotiation and initiating hip strengthening.   OBJECTIVE IMPAIRMENTS: Abnormal gait, decreased activity tolerance, decreased balance, decreased endurance, decreased mobility, difficulty walking, decreased ROM, decreased strength, increased  edema, increased fascial restrictions, increased muscle spasms, impaired flexibility, improper body mechanics, postural dysfunction, and pain.   ACTIVITY LIMITATIONS: standing, stairs, and locomotion level  PARTICIPATION LIMITATIONS: driving, shopping, community activity, and yard work  PERSONAL FACTORS: Age, Fitness, Past/current experiences, and Time since onset of injury/illness/exacerbation are also affecting patient's functional outcome.   REHAB POTENTIAL: Good  CLINICAL DECISION MAKING: Stable/uncomplicated  EVALUATION COMPLEXITY: Low   GOALS: Goals reviewed with patient? Yes  SHORT TERM GOALS: Target date: 06/07/2022   Pt will be ind with initial HEP Baseline: Goal status: INITIAL  2.  Pt will have improved hip ext to at least neutral Baseline: 20 deg away from neutral, unable to extend knee fully due to hip flexor tightness Goal status: INITIAL    LONG TERM GOALS: Target date: 07/05/2022   Pt will be ind with progression and advancement of HEP Baseline:  Goal status: INITIAL  2.  Pt will demo L = R hip ROM Baseline:  Goal status: INITIAL  3.  Pt will improve 5 x STS to </=13 sec to demo improved functional LE strength Baseline:  Goal status: INITIAL  4.  Pt will be able to amb ind >/=1000' for community level mobility Baseline:  Goal status: INITIAL  5.  Pt will report </=2/10 pain with all activities Baseline:  Goal status: INITIAL    PLAN:  PT FREQUENCY: 2x/week  PT DURATION: 8 weeks  PLANNED INTERVENTIONS: Therapeutic exercises, Therapeutic activity, Neuromuscular re-education, Balance training, Gait training, Patient/Family education, Self Care, Joint mobilization, Aquatic Therapy, Dry Needling, Electrical stimulation, Cryotherapy, Moist heat, scar mobilization, Taping, Ultrasound, Ionotophoresis '4mg'$ /ml Dexamethasone, Manual therapy, and Re-evaluation  PLAN FOR NEXT SESSION: Assess response to HEP. Manual work/TPDN as indicated for R  hip/thigh. ROM and strengthening for hip/knee.   Estill Bamberg  April Ma L Jencarlo Bonadonna, PT, DPT 05/19/2022, 1:16 PM

## 2022-05-23 ENCOUNTER — Ambulatory Visit: Payer: Medicare HMO | Admitting: Rehabilitative and Restorative Service Providers"

## 2022-05-23 ENCOUNTER — Encounter: Payer: Self-pay | Admitting: Rehabilitative and Restorative Service Providers"

## 2022-05-23 DIAGNOSIS — M25651 Stiffness of right hip, not elsewhere classified: Secondary | ICD-10-CM

## 2022-05-23 DIAGNOSIS — M25551 Pain in right hip: Secondary | ICD-10-CM | POA: Diagnosis not present

## 2022-05-23 DIAGNOSIS — M6281 Muscle weakness (generalized): Secondary | ICD-10-CM

## 2022-05-23 DIAGNOSIS — R600 Localized edema: Secondary | ICD-10-CM

## 2022-05-23 NOTE — Therapy (Signed)
OUTPATIENT PHYSICAL THERAPY TREATMENT   Patient Name: Manuel WINCHELL Sr. MRN: [id number] DOB:11-Jan-1941, 81 y.o., male Today's Date: 05/23/2022   PT End of Session - 05/23/22 1015     Visit Number 3    Number of Visits 16    Date for PT Re-Evaluation 07/05/22    Authorization Type [employer]    Authorization - Number of Visits 10    PT Start Time 1018    PT Stop Time 1100    PT Time Calculation (min) 42 min    Activity Tolerance Patient tolerated treatment well    Behavior During Therapy WFL for tasks assessed/performed              Past Medical History:  Diagnosis Date   Arthritis    Atrial fibrillation (HCC)    GERD (gastroesophageal reflux disease)    History of hiatal hernia    Hypertension    Lower extremity edema    Occasionally takes furosemide prn   Prostate cancer ([place]) 2007   tx with surgery   RLS (restless legs syndrome)    Rotator cuff tear    Right   Sleep apnea    CPAP nightly   Type 2 diabetes mellitus ([place])    Past Surgical History:  Procedure Laterality Date   FINGER ARTHROPLASTY Left 03/10/2017   Procedure: Left index finger metacarpal phalangeal arthroplasty;  Surgeon: Roseanne Kaufman, MD;  Location: [place];  Service: Orthopedics;  Laterality: Left;  90 mins   JOINT REPLACEMENT     KNEE SURGERY Bilateral    left knee replacment  2009   MANDIBLE FRACTURE SURGERY     left 2 titanium plates and 10 scres   NECK SURGERY     cervical with bone graft   PROSTATE SURGERY     prostatctomy   REVERSE SHOULDER ARTHROPLASTY Left 02/14/2020   Procedure: REVERSE SHOULDER ARTHROPLASTY;  Surgeon: Netta Cedars, MD;  Location: [facility];  Service: Orthopedics;  Laterality: Left;  Interscalene block   ROTATOR CUFF REPAIR     x 2 right x 2 left   TOTAL HIP ARTHROPLASTY Left 09/13/2016   Procedure: LEFT TOTAL HIP ARTHROPLASTY ANTERIOR APPROACH;  Surgeon: [name], MD;  Location: [place];  Service: Orthopedics;  Laterality:  Left;   TOTAL HIP ARTHROPLASTY Right 04/01/2022   Procedure: RIGHT TOTAL HIP ARTHROPLASTY ANTERIOR APPROACH;  Surgeon: [name], MD;  Location: [facility];  Service: Orthopedics;  Laterality: Right;   TOTAL KNEE ARTHROPLASTY Right 03/23/2018   Procedure: RIGHT TOTAL KNEE ARTHROPLASTY;  Surgeon: [name], MD;  Location: [facility];  Service: Orthopedics;  Laterality: Right;   Patient Active Problem List   Diagnosis Date Noted   Status post total replacement of right hip 04/01/2022   Unilateral primary osteoarthritis, right hip 12/27/2021   H/O total shoulder replacement, left 02/14/2020   Unilateral primary osteoarthritis, right knee 03/23/2018   Status post total knee replacement, right 03/23/2018   Unilateral primary osteoarthritis, left hip 09/13/2016   Status post left hip replacement 09/13/2016    PCP: n/a  REFERRING PROVIDER: Jean Rosenthal MD  REFERRING DIAG: [phone] (ICD-10-CM) - Status post total replacement of right hip  THERAPY DIAG:  Pain in right hip  Localized edema  Muscle weakness (generalized)  Stiffness of right hip, not elsewhere classified  Rationale for Evaluation and Treatment Rehabilitation  ONSET DATE: 04/14/22 R THA anterior approach  SUBJECTIVE:   SUBJECTIVE STATEMENT: The only pain patient gets is when he  swings his legs into the car. He arrives today without the cane.  PERTINENT HISTORY: LTHA, bilat TKA, L TSA  PAIN:  Are you having pain? Yes: NPRS scale: 0/10 Pain location: R hip Pain description: soreness during car transfers Aggravating factors: car transfers Relieving factors: none  PRECAUTIONS: None  WEIGHT BEARING RESTRICTIONS: No  FALLS:  Has patient fallen in last 6 months? No  PATIENT GOALS: Walking without pain   OBJECTIVE:  ____________________________________________________________________________________________ From initial evaluation  LOWER EXTREMITY ROM:  Active ROM Right eval  Left eval Right 11/2  Hip flexion 100 110 110  Hip extension -20 10 0  Hip abduction 30    Hip adduction     Hip internal rotation     Hip external rotation     Knee flexion     Knee extension     Ankle dorsiflexion     Ankle plantarflexion     Ankle inversion     Ankle eversion      (Blank rows = not tested)  LOWER EXTREMITY MMT:  MMT Right eval Left eval  Hip flexion 4+ 5  Hip extension 4+ 5  Hip abduction 4 5  Hip adduction 4 5  Hip internal rotation    Hip external rotation    Knee flexion 5 5  Knee extension 4+ 5  Ankle dorsiflexion    Ankle plantarflexion    Ankle inversion    Ankle eversion     (Blank rows = not tested)  FUNCTIONAL TESTS:   Eval: 5 times sit to stand: 19 sec Timed up and go (TUG): 13 sec  [facility] Adult PT Treatment:                                                DATE: 05/23/22  Therapeutic Exercise: Nu step level 5 x 5 minutes LEs only STANDING Heel raises x 20 reps Hip abduction x 15 reps R and L sides with green band Hip extension x 15 reps R and L sides with green band Marching near counter top x 20 reps Side stepping x 10 feet x 4 reps Sit<>Stand x 10 reps Compliant surface standing with head turns and eyes closed with CGA for safety for hip strategy SUPINE P/ROM hamstring stretching Bridging x 12 reps SLRs x 12 reps with quad set R LE SIDELYING Clamshell x 15 reps Hip abduction x 12 reps P/ROM hip flexor stretching Manual Therapy: STM quad and R IT band Gait: Gait without device x 180 ft with cues for equal stride length Stair negotiation reciprocal pattern with bilat rails x 4 steps x 2 sets  Treatment:                                                DATE: 05/19/22 Therapeutic Exercise: Nustep L5 x 5 min Supine (head propped on wedge) Hip flexor stretch x30 sec Quad stretch with strap 2x30 sec Hamstring stretch with strap x30 sec SAQ red TB x10x3 sec, green TB x10x3 sec SLR green TB x10 Clamshell green TB  2x10 Marching green TB 2X10 Standing Hip abd green TB 2x10 Hip ext green TB 2x10 Hamstring green TB 2x10 Side step up 6" 2x10 Manual Therapy: Scar massage hip flexor IASTM  quad and hip flexor Therapeutic Activity: Ascend/descend stairs 4x5 Gait Training: X2 laps with SPC SBA. Cues to keep cane in L hand to off load R LE X10' no a/d but with increased pain  TREATMENT                                                                          DATE:05/10/22 THEREX Hip flexor stretch sitting and standing x30 sec each Hamstring stretch sitting and standing x 30 sec each Figure 4 stretch supine x30 sec Piriformis stretch supine x 30 sec  MANUAL THERAPY IASTM massage roller R quad and hip flexor Scar massage R hip   PATIENT EDUCATION:  Education details: HEP Person educated: Patient Education method: Explanation, Demonstration, and Handouts Education comprehension: verbalized understanding, returned demonstration, and needs further education  HOME EXERCISE PROGRAM: Access Code: [email or web address] URL: [email or web address] Date: 05/23/2022 (updated last session) Prepared by: Rudell Cobb  Exercises - Supine Figure 4 Piriformis Stretch  - 1 x daily - 7 x weekly - 2 sets - 30 sec hold - Supine Piriformis Stretch with Foot on Ground  - 1 x daily - 7 x weekly - 2 sets - 30 sec hold - Seated Hamstring Stretch  - 1 x daily - 7 x weekly - 2 sets - 30 sec hold - Standing Hamstring Stretch with Step  - 1 x daily - 7 x weekly - 2 sets - 30 sec hold - Hip Flexor Stretch on Step  - 1 x daily - 7 x weekly - 2 sets - 30 sec hold - Seated Hip Flexor Stretch  - 1 x daily - 7 x weekly - 2 sets - 30 sec hold - Supine Hip Flexion with Resistance  - 1 x daily - 7 x weekly - 2 sets - 10 reps - Standing Hip Abduction with Resistance at Ankles and Counter Support  - 1 x daily - 7 x weekly - 2 sets - 10 reps - Standing Hip Extension with Resistance at Ankles and Counter Support  - 1 x daily -  7 x weekly - 2 sets - 10 reps - Standing Hamstring Curl with Resistance  - 1 x daily - 7 x weekly - 2 sets - 10 reps  ASSESSMENT:  CLINICAL IMPRESSION: The patient is improving with gait now ambulating without AD.  He has chronic tightness in knees that may limit hip positioning.  He tolerated increased standing there ex today noting some fatigue by end of session.  Plan to progress to tolerance.   OBJECTIVE IMPAIRMENTS: Abnormal gait, decreased activity tolerance, decreased balance, decreased endurance, decreased mobility, difficulty walking, decreased ROM, decreased strength, increased edema, increased fascial restrictions, increased muscle spasms, impaired flexibility, improper body mechanics, postural dysfunction, and pain.   GOALS: Goals reviewed with patient? Yes  SHORT TERM GOALS: Target date: 06/07/2022   Pt will be ind with initial HEP Goal status:IN PROGRESS  2.  Pt will have improved hip ext to at least neutral Baseline: 20 deg away from neutral, unable to extend knee fully due to hip flexor tightness Goal status: IN PROGRESS  LONG TERM GOALS: Target date: 07/05/2022   Pt will be ind with progression and advancement of HEP Goal status:IN PROGRESS  2.  Pt will demo L = R hip ROM Goal status: IN PROGRESS   3.  Pt will improve 5 x STS to </=13 sec to demo improved functional LE strength Goal status:IN PROGRESS   4.  Pt will be able to amb ind >/=1000' for community level mobility Goal status: IN PROGRESS   5.  Pt will report </=2/10 pain with all activities Goal status: IN PROGRESS  PLAN:  PT FREQUENCY: 2x/week  PT DURATION: 8 weeks  PLANNED INTERVENTIONS: Therapeutic exercises, Therapeutic activity, Neuromuscular re-education, Balance training, Gait training, Patient/Family education, Self Care, Joint mobilization, Aquatic Therapy, Dry Needling, Electrical stimulation, Cryotherapy, Moist heat, scar mobilization, Taping, Ultrasound, Ionotophoresis '4mg'$ /ml  Dexamethasone, Manual therapy, and Re-evaluation  PLAN FOR NEXT SESSION: Assess response to HEP. Manual work/TPDN as indicated for R hip/thigh. ROM and strengthening for hip/knee.   [place], PT 05/23/2022, 12:46 PM

## 2022-05-24 NOTE — Therapy (Signed)
OUTPATIENT PHYSICAL THERAPY TREATMENT   Patient Name: Manuel HALLORAN Sr. MRN: [id number] DOB:July 29, 1940, 81 y.o., male Today's Date: 05/25/2022   PT End of Session - 05/25/22 0932     Visit Number 4    Number of Visits 16    Date for PT Re-Evaluation 07/05/22    Authorization Type [employer]    Progress Note Due on Visit 10    PT Start Time 0932    PT Stop Time 1011    PT Time Calculation (min) 39 min    Activity Tolerance Patient tolerated treatment well    Behavior During Therapy WFL for tasks assessed/performed               Past Medical History:  Diagnosis Date   Arthritis    Atrial fibrillation (HCC)    GERD (gastroesophageal reflux disease)    History of hiatal hernia    Hypertension    Lower extremity edema    Occasionally takes furosemide prn   Prostate cancer ([place]) 2007   tx with surgery   RLS (restless legs syndrome)    Rotator cuff tear    Right   Sleep apnea    CPAP nightly   Type 2 diabetes mellitus ([place])    Past Surgical History:  Procedure Laterality Date   FINGER ARTHROPLASTY Left 03/10/2017   Procedure: Left index finger metacarpal phalangeal arthroplasty;  Surgeon: Roseanne Kaufman, MD;  Location: [place];  Service: Orthopedics;  Laterality: Left;  90 mins   JOINT REPLACEMENT     KNEE SURGERY Bilateral    left knee replacment  2009   MANDIBLE FRACTURE SURGERY     left 2 titanium plates and 10 scres   NECK SURGERY     cervical with bone graft   PROSTATE SURGERY     prostatctomy   REVERSE SHOULDER ARTHROPLASTY Left 02/14/2020   Procedure: REVERSE SHOULDER ARTHROPLASTY;  Surgeon: Netta Cedars, MD;  Location: [facility];  Service: Orthopedics;  Laterality: Left;  Interscalene block   ROTATOR CUFF REPAIR     x 2 right x 2 left   TOTAL HIP ARTHROPLASTY Left 09/13/2016   Procedure: LEFT TOTAL HIP ARTHROPLASTY ANTERIOR APPROACH;  Surgeon: [name], MD;  Location: [place];  Service: Orthopedics;  Laterality: Left;    TOTAL HIP ARTHROPLASTY Right 04/01/2022   Procedure: RIGHT TOTAL HIP ARTHROPLASTY ANTERIOR APPROACH;  Surgeon: [name], MD;  Location: [facility];  Service: Orthopedics;  Laterality: Right;   TOTAL KNEE ARTHROPLASTY Right 03/23/2018   Procedure: RIGHT TOTAL KNEE ARTHROPLASTY;  Surgeon: [name], MD;  Location: [facility];  Service: Orthopedics;  Laterality: Right;   Patient Active Problem List   Diagnosis Date Noted   Status post total replacement of right hip 04/01/2022   Unilateral primary osteoarthritis, right hip 12/27/2021   H/O total shoulder replacement, left 02/14/2020   Unilateral primary osteoarthritis, right knee 03/23/2018   Status post total knee replacement, right 03/23/2018   Unilateral primary osteoarthritis, left hip 09/13/2016   Status post left hip replacement 09/13/2016    PCP: n/a  REFERRING PROVIDER: Jean Rosenthal MD  REFERRING DIAG: [phone] (ICD-10-CM) - Status post total replacement of right hip  THERAPY DIAG:  Pain in right hip  Localized edema  Muscle weakness (generalized)  Stiffness of right hip, not elsewhere classified  Rationale for Evaluation and Treatment Rehabilitation  ONSET DATE: 04/14/22 R THA anterior approach  SUBJECTIVE:   SUBJECTIVE STATEMENT: I've made a lot of progress in  the past two weeks. Not using cane anymore.  PERTINENT HISTORY: LTHA, bilat TKA, L TSA  PAIN:  Are you having pain? Yes: NPRS scale: 0/10 Pain location: R hip Pain description: soreness during car transfers Aggravating factors: car transfers Relieving factors: none  PRECAUTIONS: None  WEIGHT BEARING RESTRICTIONS: No  FALLS:  Has patient fallen in last 6 months? No  PATIENT GOALS: Walking without pain   OBJECTIVE:  ____________________________________________________________________________________________ From initial evaluation  LOWER EXTREMITY ROM:  Active ROM Right eval Left eval Right 11/2  Hip flexion  100 110 110  Hip extension -20 10 0  Hip abduction 30    Hip adduction     Hip internal rotation     Hip external rotation     Knee flexion     Knee extension     Ankle dorsiflexion     Ankle plantarflexion     Ankle inversion     Ankle eversion      (Blank rows = not tested)  LOWER EXTREMITY MMT:  MMT Right eval Left eval  Hip flexion 4+ 5  Hip extension 4+ 5  Hip abduction 4 5  Hip adduction 4 5  Hip internal rotation    Hip external rotation    Knee flexion 5 5  Knee extension 4+ 5  Ankle dorsiflexion    Ankle plantarflexion    Ankle inversion    Ankle eversion     (Blank rows = not tested)  FUNCTIONAL TESTS:   Eval: 5 times sit to stand: 19 sec Timed up and go (TUG): 13 sec  [facility] Adult PT Treatment:                                                DATE: 05/25/22  Therapeutic Exercise:  Nu step level 5 x 5 minutes  STANDING Heel raises x 20 reps Hip abduction x 15 reps R and L sides with green band Hip extension x 15 reps R and L sides with green band Marching GTB x 15 reps R/L GTB Side stepping x 10 feet x 4 reps Sit<>Stand x 10 reps SUPINE P/ROM hamstring stretching Bridging with ABD GTB  x 10 reps SLRs x 12 reps with quad set R LE SIDELYING Clamshell x 15 reps Hip abduction  GTB x 12 reps P/ROM hip flexor stretching  Gait: Stair negotiation reciprocal pattern with bilat rails x 4 steps x 2 sets Heel taps laterally standing on R knee x 10 4 inch step    [facility] Adult PT Treatment:                                                DATE: 05/23/22  Therapeutic Exercise: Nu step level 5 x 5 minutes LEs only STANDING Heel raises x 20 reps Hip abduction x 15 reps R and L sides with green band Hip extension x 15 reps R and L sides with green band Marching near counter top x 20 reps Side stepping x 10 feet x 4 reps Sit<>Stand x 10 reps Compliant surface standing with head turns and eyes closed with CGA for safety for hip strategy SUPINE P/ROM hamstring  stretching Bridging x 12 reps SLRs x 12 reps with quad set R  LE SIDELYING Clamshell x 15 reps Hip abduction x 12 reps P/ROM hip flexor stretching Manual Therapy: STM quad and R IT band Gait: Gait without device x 180 ft with cues for equal stride length Stair negotiation reciprocal pattern with bilat rails x 4 steps x 2 sets  Treatment:                                                DATE: 05/19/22 Therapeutic Exercise: Nustep L5 x 5 min Supine (head propped on wedge) Hip flexor stretch x30 sec Quad stretch with strap 2x30 sec Hamstring stretch with strap x30 sec SAQ red TB x10x3 sec, green TB x10x3 sec SLR green TB x10 Clamshell green TB 2x10 Marching green TB 2X10 Standing Hip abd green TB 2x10 Hip ext green TB 2x10 Hamstring green TB 2x10 Side step up 6" 2x10 Manual Therapy: Scar massage hip flexor IASTM quad and hip flexor Therapeutic Activity: Ascend/descend stairs 4x5 Gait Training: X2 laps with SPC SBA. Cues to keep cane in L hand to off load R LE X10' no a/d but with increased pain   PATIENT EDUCATION:  Education details: HEP Person educated: Patient Education method: Explanation, Demonstration, and Handouts Education comprehension: verbalized understanding, returned demonstration, and needs further education  HOME EXERCISE PROGRAM: Access Code: [email or web address] URL: [email or web address] Date: 05/23/2022 (updated last session) Prepared by: Rudell Cobb  Exercises - Supine Figure 4 Piriformis Stretch  - 1 x daily - 7 x weekly - 2 sets - 30 sec hold - Supine Piriformis Stretch with Foot on Ground  - 1 x daily - 7 x weekly - 2 sets - 30 sec hold - Seated Hamstring Stretch  - 1 x daily - 7 x weekly - 2 sets - 30 sec hold - Standing Hamstring Stretch with Step  - 1 x daily - 7 x weekly - 2 sets - 30 sec hold - Hip Flexor Stretch on Step  - 1 x daily - 7 x weekly - 2 sets - 30 sec hold - Seated Hip Flexor Stretch  - 1 x daily - 7 x weekly - 2 sets -  30 sec hold - Supine Hip Flexion with Resistance  - 1 x daily - 7 x weekly - 2 sets - 10 reps - Standing Hip Abduction with Resistance at Ankles and Counter Support  - 1 x daily - 7 x weekly - 2 sets - 10 reps - Standing Hip Extension with Resistance at Ankles and Counter Support  - 1 x daily - 7 x weekly - 2 sets - 10 reps - Standing Hamstring Curl with Resistance  - 1 x daily - 7 x weekly - 2 sets - 10 reps  ASSESSMENT:  CLINICAL IMPRESSION: Manuel Hunter is progressing well with strengthening and able to progress many exercises today with more resistance without reports of pain.   OBJECTIVE IMPAIRMENTS: Abnormal gait, decreased activity tolerance, decreased balance, decreased endurance, decreased mobility, difficulty walking, decreased ROM, decreased strength, increased edema, increased fascial restrictions, increased muscle spasms, impaired flexibility, improper body mechanics, postural dysfunction, and pain.   GOALS: Goals reviewed with patient? Yes  SHORT TERM GOALS: Target date: 06/07/2022   Pt will be ind with initial HEP Goal status:IN PROGRESS  2.  Pt will have improved hip ext to at least neutral Baseline: 20 deg away from neutral, unable to extend knee fully due  to hip flexor tightness Goal status: IN PROGRESS  LONG TERM GOALS: Target date: 07/05/2022   Pt will be ind with progression and advancement of HEP Goal status:IN PROGRESS   2.  Pt will demo L = R hip ROM Goal status: IN PROGRESS   3.  Pt will improve 5 x STS to </=13 sec to demo improved functional LE strength Goal status:IN PROGRESS   4.  Pt will be able to amb ind >/=1000' for community level mobility Goal status: IN PROGRESS   5.  Pt will report </=2/10 pain with all activities Goal status: IN PROGRESS  PLAN:  PT FREQUENCY: 2x/week  PT DURATION: 8 weeks  PLANNED INTERVENTIONS: Therapeutic exercises, Therapeutic activity, Neuromuscular re-education, Balance training, Gait training, Patient/Family  education, Self Care, Joint mobilization, Aquatic Therapy, Dry Needling, Electrical stimulation, Cryotherapy, Moist heat, scar mobilization, Taping, Ultrasound, Ionotophoresis '4mg'$ /ml Dexamethasone, Manual therapy, and Re-evaluation  PLAN FOR NEXT SESSION: Assess response to HEP. Manual work/TPDN as indicated for R hip/thigh. ROM and strengthening for hip/knee.   Katianna Mcclenney, PT 05/25/2022, 10:12 AM

## 2022-05-25 ENCOUNTER — Ambulatory Visit: Payer: Medicare HMO | Admitting: Physical Therapy

## 2022-05-25 ENCOUNTER — Encounter: Payer: Self-pay | Admitting: Physical Therapy

## 2022-05-25 DIAGNOSIS — M25651 Stiffness of right hip, not elsewhere classified: Secondary | ICD-10-CM

## 2022-05-25 DIAGNOSIS — M25551 Pain in right hip: Secondary | ICD-10-CM

## 2022-05-25 DIAGNOSIS — R600 Localized edema: Secondary | ICD-10-CM

## 2022-05-25 DIAGNOSIS — M6281 Muscle weakness (generalized): Secondary | ICD-10-CM

## 2022-05-27 ENCOUNTER — Other Ambulatory Visit: Payer: Self-pay | Admitting: Orthopaedic Surgery

## 2022-05-27 NOTE — Telephone Encounter (Signed)
Needs to get from his cardiologist.

## 2022-05-29 ENCOUNTER — Other Ambulatory Visit: Payer: Self-pay | Admitting: Orthopaedic Surgery

## 2022-05-30 ENCOUNTER — Ambulatory Visit: Payer: Medicare HMO | Admitting: Physical Therapy

## 2022-05-30 ENCOUNTER — Encounter: Payer: Self-pay | Admitting: Physical Therapy

## 2022-05-30 DIAGNOSIS — M25551 Pain in right hip: Secondary | ICD-10-CM | POA: Diagnosis not present

## 2022-05-30 DIAGNOSIS — M6281 Muscle weakness (generalized): Secondary | ICD-10-CM

## 2022-05-30 DIAGNOSIS — R600 Localized edema: Secondary | ICD-10-CM

## 2022-05-30 DIAGNOSIS — M25651 Stiffness of right hip, not elsewhere classified: Secondary | ICD-10-CM

## 2022-05-30 NOTE — Therapy (Signed)
OUTPATIENT PHYSICAL THERAPY TREATMENT   Patient Name: Manuel STEGALL Sr. MRN: [id number] DOB:12-Nov-1940, 81 y.o., male Today's Date: 05/30/2022   PT End of Session - 05/30/22 0936     Visit Number 5    Number of Visits 16    Date for PT Re-Evaluation 07/05/22    Authorization Type [employer]    Progress Note Due on Visit 10    PT Start Time 0935    PT Stop Time 1015    PT Time Calculation (min) 40 min    Activity Tolerance Patient tolerated treatment well    Behavior During Therapy WFL for tasks assessed/performed                Past Medical History:  Diagnosis Date   Arthritis    Atrial fibrillation ([place])    GERD (gastroesophageal reflux disease)    History of hiatal hernia    Hypertension    Lower extremity edema    Occasionally takes furosemide prn   Prostate cancer ([place]) 2007   tx with surgery   RLS (restless legs syndrome)    Rotator cuff tear    Right   Sleep apnea    CPAP nightly   Type 2 diabetes mellitus ([place])    Past Surgical History:  Procedure Laterality Date   FINGER ARTHROPLASTY Left 03/10/2017   Procedure: Left index finger metacarpal phalangeal arthroplasty;  Surgeon: Roseanne Kaufman, MD;  Location: [facility];  Service: Orthopedics;  Laterality: Left;  90 mins   JOINT REPLACEMENT     KNEE SURGERY Bilateral    left knee replacment  2009   MANDIBLE FRACTURE SURGERY     left 2 titanium plates and 10 scres   NECK SURGERY     cervical with bone graft   PROSTATE SURGERY     prostatctomy   REVERSE SHOULDER ARTHROPLASTY Left 02/14/2020   Procedure: REVERSE SHOULDER ARTHROPLASTY;  Surgeon: Netta Cedars, MD;  Location: [facility];  Service: Orthopedics;  Laterality: Left;  Interscalene block   ROTATOR CUFF REPAIR     x 2 right x 2 left   TOTAL HIP ARTHROPLASTY Left 09/13/2016   Procedure: LEFT TOTAL HIP ARTHROPLASTY ANTERIOR APPROACH;  Surgeon: [name], MD;  Location: [place];  Service: Orthopedics;  Laterality:  Left;   TOTAL HIP ARTHROPLASTY Right 04/01/2022   Procedure: RIGHT TOTAL HIP ARTHROPLASTY ANTERIOR APPROACH;  Surgeon: [name], MD;  Location: [facility];  Service: Orthopedics;  Laterality: Right;   TOTAL KNEE ARTHROPLASTY Right 03/23/2018   Procedure: RIGHT TOTAL KNEE ARTHROPLASTY;  Surgeon: [name], MD;  Location: [facility];  Service: Orthopedics;  Laterality: Right;   Patient Active Problem List   Diagnosis Date Noted   Status post total replacement of right hip 04/01/2022   Unilateral primary osteoarthritis, right hip 12/27/2021   H/O total shoulder replacement, left 02/14/2020   Unilateral primary osteoarthritis, right knee 03/23/2018   Status post total knee replacement, right 03/23/2018   Unilateral primary osteoarthritis, left hip 09/13/2016   Status post left hip replacement 09/13/2016    PCP: n/a  REFERRING PROVIDER: Jean Rosenthal MD  REFERRING DIAG: [phone] (ICD-10-CM) - Status post total replacement of right hip  THERAPY DIAG:  Pain in right hip  Localized edema  Muscle weakness (generalized)  Stiffness of right hip, not elsewhere classified  Rationale for Evaluation and Treatment Rehabilitation  ONSET DATE: 04/14/22 R THA anterior approach  SUBJECTIVE:   SUBJECTIVE STATEMENT: Pt reports he has been doing  really well. Pt has been walking around the house without any a/d. Pt states the primary time he gets pain is when he gets in/out of the car. Pt to see his cardiologist to f/u on his A-fib.   PERTINENT HISTORY: LTHA, bilat TKA, L TSA  PAIN:  Are you having pain? Yes: NPRS scale: 0/10 Pain location: R hip Pain description: soreness during car transfers Aggravating factors: car transfers Relieving factors: none  PRECAUTIONS: None  WEIGHT BEARING RESTRICTIONS: No  FALLS:  Has patient fallen in last 6 months? No  PATIENT GOALS: Walking without pain   OBJECTIVE:   ____________________________________________________________________________________________ From initial evaluation  LOWER EXTREMITY ROM:  Active ROM Right eval Left eval Right 11/2  Hip flexion 100 110 110  Hip extension -20 10 0  Hip abduction 30    Hip adduction     Hip internal rotation     Hip external rotation     Knee flexion     Knee extension     Ankle dorsiflexion     Ankle plantarflexion     Ankle inversion     Ankle eversion      (Blank rows = not tested)  LOWER EXTREMITY MMT:  MMT Right eval Left eval  Hip flexion 4+ 5  Hip extension 4+ 5  Hip abduction 4 5  Hip adduction 4 5  Hip internal rotation    Hip external rotation    Knee flexion 5 5  Knee extension 4+ 5  Ankle dorsiflexion    Ankle plantarflexion    Ankle inversion    Ankle eversion     (Blank rows = not tested)  FUNCTIONAL TESTS:   Eval: 5 times sit to stand: 19 sec Timed up and go (TUG): 13 sec  [facility] Adult PT Treatment:                                                DATE: 05/30/22 Therapeutic Exercise: Nu step level 5 x 5 minutes SUPINE Piriformis stretch 2x30 sec R Butterfly stretch 2x30 sec R Figure 4 stretch 2x30 sec R SITTING Hip flexion into side step 2x10 green TB  Hip IR 2x10 green TB Hip ER 2x10 green TB STANDING Hip flexor stretch 2x30 sec Hamstring curl 2x10 SLS with UE support 2x30 sec Runner's step up 2x10 Self Care: Self massage with massage stick   [facility] Adult PT Treatment:                                                DATE: 05/25/22  Therapeutic Exercise:  Nu step level 5 x 5 minutes  STANDING Heel raises x 20 reps Hip abduction x 15 reps R and L sides with green band Hip extension x 15 reps R and L sides with green band Marching GTB x 15 reps R/L GTB Side stepping x 10 feet x 4 reps Sit<>Stand x 10 reps SUPINE P/ROM hamstring stretching Bridging with ABD GTB  x 10 reps SLRs x 12 reps with quad set R LE SIDELYING Clamshell x 15 reps Hip  abduction  GTB x 12 reps P/ROM hip flexor stretching  Gait: Stair negotiation reciprocal pattern with bilat rails x 4 steps x 2 sets Heel taps laterally  standing on R knee x 10 4 inch step    [facility] Adult PT Treatment:                                                DATE: 05/23/22  Therapeutic Exercise: Nu step level 5 x 5 minutes LEs only STANDING Heel raises x 20 reps Hip abduction x 15 reps R and L sides with green band Hip extension x 15 reps R and L sides with green band Marching near counter top x 20 reps Side stepping x 10 feet x 4 reps Sit<>Stand x 10 reps Compliant surface standing with head turns and eyes closed with CGA for safety for hip strategy SUPINE P/ROM hamstring stretching Bridging x 12 reps SLRs x 12 reps with quad set R LE SIDELYING Clamshell x 15 reps Hip abduction x 12 reps P/ROM hip flexor stretching Manual Therapy: STM quad and R IT band Gait: Gait without device x 180 ft with cues for equal stride length Stair negotiation reciprocal pattern with bilat rails x 4 steps x 2 sets    PATIENT EDUCATION:  Education details: HEP Person educated: Patient Education method: Consulting civil engineer, Media planner, and Handouts Education comprehension: verbalized understanding, returned demonstration, and needs further education  HOME EXERCISE PROGRAM: Access Code: [email or web address] URL: [email or web address] Date: 05/30/2022 Prepared by: Manuel Hunter  Exercises - Supine Figure 4 Piriformis Stretch  - 1 x daily - 7 x weekly - 2 sets - 30 sec hold - Supine Piriformis Stretch with Foot on Ground  - 1 x daily - 7 x weekly - 2 sets - 30 sec hold - Seated Hamstring Stretch  - 1 x daily - 7 x weekly - 2 sets - 30 sec hold - Standing Hamstring Stretch with Step  - 1 x daily - 7 x weekly - 2 sets - 30 sec hold - Hip Flexor Stretch on Step  - 1 x daily - 7 x weekly - 2 sets - 30 sec hold - Seated Hip Flexor Stretch  - 1 x daily - 7 x weekly - 2 sets - 30  sec hold - Supine Hip Flexion with Resistance  - 1 x daily - 7 x weekly - 2 sets - 10 reps - Standing Hip Abduction with Resistance at Ankles and Counter Support  - 1 x daily - 7 x weekly - 2 sets - 10 reps - Standing Hip Extension with Resistance at Ankles and Counter Support  - 1 x daily - 7 x weekly - 2 sets - 10 reps - Standing Hamstring Curl with Resistance  - 1 x daily - 7 x weekly - 2 sets - 10 reps - Seated Hip External Rotation with Resistance  - 1 x daily - 7 x weekly - 2 sets - 10 reps - Seated Hip Internal Rotation with Ball and Resistance  - 1 x daily - 7 x weekly - 2 sets - 10 reps  ASSESSMENT:  CLINICAL IMPRESSION: Manuel Hunter continues to progress well. Pt feels most things are feeling back to normal. Pt does report some continued tightness/dent along his R lateral thigh but no pain. Only time of pain is during car transfers -- worked primarily on hip rotation and hip abd and flexion this session to work on improving mobility and strength for this motion.   OBJECTIVE IMPAIRMENTS: Abnormal gait, decreased activity tolerance,  decreased balance, decreased endurance, decreased mobility, difficulty walking, decreased ROM, decreased strength, increased edema, increased fascial restrictions, increased muscle spasms, impaired flexibility, improper body mechanics, postural dysfunction, and pain.   GOALS: Goals reviewed with patient? Yes  SHORT TERM GOALS: Target date: 06/07/2022   Pt will be ind with initial HEP Goal status: MET  2.  Pt will have improved hip ext to at least neutral Baseline: 0 deg Goal status: MET  LONG TERM GOALS: Target date: 07/05/2022   Pt will be ind with progression and advancement of HEP Goal status:IN PROGRESS   2.  Pt will demo L = R hip ROM Goal status: IN PROGRESS   3.  Pt will improve 5 x STS to </=13 sec to demo improved functional LE strength Goal status:IN PROGRESS   4.  Pt will be able to amb ind >/=1000' for community level  mobility Goal status: IN PROGRESS   5.  Pt will report </=2/10 pain with all activities Goal status: IN PROGRESS  PLAN:  PT FREQUENCY: 2x/week  PT DURATION: 8 weeks  PLANNED INTERVENTIONS: Therapeutic exercises, Therapeutic activity, Neuromuscular re-education, Balance training, Gait training, Patient/Family education, Self Care, Joint mobilization, Aquatic Therapy, Dry Needling, Electrical stimulation, Cryotherapy, Moist heat, scar mobilization, Taping, Ultrasound, Ionotophoresis 15m/ml Dexamethasone, Manual therapy, and Re-evaluation  PLAN FOR NEXT SESSION: Assess response to HEP. Manual work/TPDN as indicated for R hip/thigh. ROM and strengthening for hip/knee.   Powell Halbert April Ma L Yasamin Karel, PT, DPT 05/30/2022, 9:37 AM

## 2022-05-31 ENCOUNTER — Other Ambulatory Visit: Payer: Self-pay | Admitting: Orthopaedic Surgery

## 2022-05-31 ENCOUNTER — Telehealth: Payer: Self-pay

## 2022-05-31 NOTE — Telephone Encounter (Signed)
Patient and wife called today about Lasix and other meds such as for BP that Dr. Ninfa Linden has prescribed since surgery but is no longer approving to fill.  Per Dr. Ninfa Linden, patient should follow up with primary care and/or cardiology to continue meds like this.  I advised patient and wife.  Patient already has PCP appt on Thursday, 06/02/22.

## 2022-06-01 ENCOUNTER — Encounter: Payer: Self-pay | Admitting: Physical Therapy

## 2022-06-01 ENCOUNTER — Ambulatory Visit: Payer: Medicare HMO | Admitting: Physical Therapy

## 2022-06-01 ENCOUNTER — Other Ambulatory Visit: Payer: Self-pay | Admitting: Orthopaedic Surgery

## 2022-06-01 DIAGNOSIS — M25651 Stiffness of right hip, not elsewhere classified: Secondary | ICD-10-CM

## 2022-06-01 DIAGNOSIS — M25551 Pain in right hip: Secondary | ICD-10-CM

## 2022-06-01 DIAGNOSIS — M6281 Muscle weakness (generalized): Secondary | ICD-10-CM

## 2022-06-01 DIAGNOSIS — R600 Localized edema: Secondary | ICD-10-CM

## 2022-06-01 NOTE — Therapy (Signed)
OUTPATIENT PHYSICAL THERAPY TREATMENT   Patient Name: Manuel KOLENDA Sr. MRN: [id number] DOB:09-18-1940, 81 y.o., male Today's Date: 06/01/2022   PT End of Session - 06/01/22 1318     Visit Number 6    Number of Visits 16    Date for PT Re-Evaluation 07/05/22    Authorization Type [employer]    Progress Note Due on Visit 10    PT Start Time 1318    PT Stop Time 1400    PT Time Calculation (min) 42 min    Activity Tolerance Patient tolerated treatment well    Behavior During Therapy WFL for tasks assessed/performed                Past Medical History:  Diagnosis Date   Arthritis    Atrial fibrillation ([facility])    GERD (gastroesophageal reflux disease)    History of hiatal hernia    Hypertension    Lower extremity edema    Occasionally takes furosemide prn   Prostate cancer ([place]) 2007   tx with surgery   RLS (restless legs syndrome)    Rotator cuff tear    Right   Sleep apnea    CPAP nightly   Type 2 diabetes mellitus ([place])    Past Surgical History:  Procedure Laterality Date   FINGER ARTHROPLASTY Left 03/10/2017   Procedure: Left index finger metacarpal phalangeal arthroplasty;  Surgeon: Roseanne Kaufman, MD;  Location: [facility];  Service: Orthopedics;  Laterality: Left;  90 mins   JOINT REPLACEMENT     KNEE SURGERY Bilateral    left knee replacment  2009   MANDIBLE FRACTURE SURGERY     left 2 titanium plates and 10 scres   NECK SURGERY     cervical with bone graft   PROSTATE SURGERY     prostatctomy   REVERSE SHOULDER ARTHROPLASTY Left 02/14/2020   Procedure: REVERSE SHOULDER ARTHROPLASTY;  Surgeon: Netta Cedars, MD;  Location: [facility];  Service: Orthopedics;  Laterality: Left;  Interscalene block   ROTATOR CUFF REPAIR     x 2 right x 2 left   TOTAL HIP ARTHROPLASTY Left 09/13/2016   Procedure: LEFT TOTAL HIP ARTHROPLASTY ANTERIOR APPROACH;  Surgeon: [name], MD;  Location: [place];  Service: Orthopedics;  Laterality:  Left;   TOTAL HIP ARTHROPLASTY Right 04/01/2022   Procedure: RIGHT TOTAL HIP ARTHROPLASTY ANTERIOR APPROACH;  Surgeon: [name], MD;  Location[facility];  Service: Orthopedics;  Laterality: Right;   TOTAL KNEE ARTHROPLASTY Right 03/23/2018   Procedure: RIGHT TOTAL KNEE ARTHROPLASTY;  Surgeon: [name], MD;  Location: [facility];  Service: Orthopedics;  Laterality: Right;   Patient Active Problem List   Diagnosis Date Noted   Status post total replacement of right hip 04/01/2022   Unilateral primary osteoarthritis, right hip 12/27/2021   H/O total shoulder replacement, left 02/14/2020   Unilateral primary osteoarthritis, right knee 03/23/2018   Status post total knee replacement, right 03/23/2018   Unilateral primary osteoarthritis, left hip 09/13/2016   Status post left hip replacement 09/13/2016    [facility]   REFERRING PROVIDER: Jean Rosenthal MD  REFERRING DIAG: [phone] (ICD-10-CM) - Status post total replacement of right hip  THERAPY DIAG:  Pain in right hip  Localized edema  Muscle weakness (generalized)  Stiffness of right hip, not elsewhere classified  Rationale for Evaluation and Treatment Rehabilitation  ONSET DATE: 04/14/22 R THA anterior approach  SUBJECTIVE:   SUBJECTIVE STATEMENT: "Not so much  pain." Pt reports he has some breathing problems -- has noted this since the hospital.   PERTINENT HISTORY: LTHA, bilat TKA, L TSA  PAIN:  Are you having pain? Yes: NPRS scale: 0/10 Pain location: R hip Pain description: soreness during car transfers Aggravating factors: car transfers Relieving factors: none  PRECAUTIONS: None  WEIGHT BEARING RESTRICTIONS: No  FALLS:  Has patient fallen in last 6 months? No  PATIENT GOALS: Walking without pain   OBJECTIVE:  ____________________________________________________________________________________________ From initial evaluation  LOWER EXTREMITY ROM:  Active  ROM Right eval Left eval Right 11/2 Right 11/15  Hip flexion 100 110 110 110  Hip extension -20 10 0 20  Hip abduction 30     Hip adduction      Hip internal rotation      Hip external rotation      Knee flexion      Knee extension      Ankle dorsiflexion      Ankle plantarflexion      Ankle inversion      Ankle eversion       (Blank rows = not tested)  LOWER EXTREMITY MMT:  MMT Right eval Left eval Right 11/15  Hip flexion 4+ 5 5  Hip extension 4+ 5 5  Hip abduction 4 5 4+  Hip adduction 4 5   Hip internal rotation     Hip external rotation     Knee flexion _0 Knee extension 4+ 5 5  Ankle dorsiflexion     Ankle plantarflexion     Ankle inversion     Ankle eversion      (Blank rows = not tested)  FUNCTIONAL TESTS:   Eval: 5 times sit to stand: 19 sec Timed up and go (TUG): 13 sec  11/15 5 times sit to stand: 16 sec   [facility] Adult PT Treatment:                                                DATE: 06/01/22 Therapeutic Exercise: Nu step level 5 x 5 minutes SUPINE Marching 2x10 Hip flexor stretch 2x30 sec SKTC x30 sec  STANDING Amb x 10 min around building up/down hills -- multiple rest breaks required due to [place] monitored -- spO2 remained at 98%, HR at most 120 BPM; however, pt with history of a-fib and will be following up with cardiology  Therapeutic Activity:  Ascend 2 flights of stairs (~15 steps) with one rest break   [facility] Adult PT Treatment:                                                DATE: 05/30/22 Therapeutic Exercise: Nu step level 5 x 5 minutes SUPINE Piriformis stretch 2x30 sec R Butterfly stretch 2x30 sec R Figure 4 stretch 2x30 sec R SITTING Hip flexion into side step 2x10 green TB  Hip IR 2x10 green TB Hip ER 2x10 green TB STANDING Hip flexor stretch 2x30 sec Hamstring curl 2x10 SLS with UE support 2x30 sec Runner's step up 2x10 Self Care: Self massage with massage stick   [facility] Adult PT Treatment:  DATE: 05/25/22  Therapeutic Exercise:  Nu step level 5 x 5 minutes  STANDING Heel raises x 20 reps Hip abduction x 15 reps R and L sides with green band Hip extension x 15 reps R and L sides with green band Marching GTB x 15 reps R/L GTB Side stepping x 10 feet x 4 reps Sit<>Stand x 10 reps SUPINE P/ROM hamstring stretching Bridging with ABD GTB  x 10 reps SLRs x 12 reps with quad set R LE SIDELYING Clamshell x 15 reps Hip abduction  GTB x 12 reps P/ROM hip flexor stretching  Gait: Stair negotiation reciprocal pattern with bilat rails x 4 steps x 2 sets Heel taps laterally standing on R knee x 10 4 inch step     PATIENT EDUCATION:  Education details: Discussed initiating a walking program Person educated: Patient Education method: Explanation, Demonstration, and Handouts Education comprehension: verbalized understanding, returned demonstration, and needs further education  HOME EXERCISE PROGRAM: Access Code: [email or web address] URL: [email or web address] Date: 05/30/2022 Prepared by: Estill Bamberg April Thurnell Garbe  Exercises - Supine Figure 4 Piriformis Stretch  - 1 x daily - 7 x weekly - 2 sets - 30 sec hold - Supine Piriformis Stretch with Foot on Ground  - 1 x daily - 7 x weekly - 2 sets - 30 sec hold - Seated Hamstring Stretch  - 1 x daily - 7 x weekly - 2 sets - 30 sec hold - Standing Hamstring Stretch with Step  - 1 x daily - 7 x weekly - 2 sets - 30 sec hold - Hip Flexor Stretch on Step  - 1 x daily - 7 x weekly - 2 sets - 30 sec hold - Seated Hip Flexor Stretch  - 1 x daily - 7 x weekly - 2 sets - 30 sec hold - Supine Hip Flexion with Resistance  - 1 x daily - 7 x weekly - 2 sets - 10 reps - Standing Hip Abduction with Resistance at Ankles and Counter Support  - 1 x daily - 7 x weekly - 2 sets - 10 reps - Standing Hip Extension with Resistance at Ankles and Counter Support  - 1 x daily - 7 x weekly - 2 sets - 10 reps - Standing  Hamstring Curl with Resistance  - 1 x daily - 7 x weekly - 2 sets - 10 reps - Seated Hip External Rotation with Resistance  - 1 x daily - 7 x weekly - 2 sets - 10 reps - Seated Hip Internal Rotation with Ball and Resistance  - 1 x daily - 7 x weekly - 2 sets - 10 reps  ASSESSMENT:  CLINICAL IMPRESSION: Re-checked pt's goals -- pt has met 4/5 LTGs. Pt has met all strength goals. 5x STS limited due to [facility]. Pt's greatest deficit is his hips fatigue/lack endurance with prolonged activities. Initiated walking program to try and address this.   OBJECTIVE IMPAIRMENTS: Abnormal gait, decreased activity tolerance, decreased balance, decreased endurance, decreased mobility, difficulty walking, decreased ROM, decreased strength, increased edema, increased fascial restrictions, increased muscle spasms, impaired flexibility, improper body mechanics, postural dysfunction, and pain.   GOALS: Goals reviewed with patient? Yes  SHORT TERM GOALS: Target date: 06/07/2022   Pt will be ind with initial HEP Goal status: MET  2.  Pt will have improved hip ext to at least neutral Baseline: 0 deg Goal status: MET  LONG TERM GOALS: Target date: 07/05/2022   Pt will be ind with progression and advancement of HEP Goal  status:IN PROGRESS   2.  Pt will demo L = R hip ROM Goal status: MET   3.  Pt will improve 5 x STS to </=13 sec to demo improved functional LE strength Goal status:IN PROGRESS   4.  Pt will be able to amb ind >/=1000' for community level mobility Goal status: MET   5.  Pt will report </=2/10 pain with all activities Goal status: MET  PLAN:  PT FREQUENCY: 2x/week  PT DURATION: 8 weeks  PLANNED INTERVENTIONS: Therapeutic exercises, Therapeutic activity, Neuromuscular re-education, Balance training, Gait training, Patient/Family education, Self Care, Joint mobilization, Aquatic Therapy, Dry Needling, Electrical stimulation, Cryotherapy, Moist heat, scar mobilization, Taping,  Ultrasound, Ionotophoresis 45m/ml Dexamethasone, Manual therapy, and Re-evaluation  PLAN FOR NEXT SESSION: Assess response to HEP. Manual work/TPDN as indicated for R hip/thigh. ROM and strengthening for hip/knee.   Glayds Insco April Ma L Naija Troost, PT, DPT 06/01/2022, 1:18 PM

## 2022-06-04 ENCOUNTER — Other Ambulatory Visit: Payer: Self-pay | Admitting: Orthopaedic Surgery

## 2022-06-05 ENCOUNTER — Other Ambulatory Visit: Payer: Self-pay | Admitting: Orthopaedic Surgery

## 2022-06-06 ENCOUNTER — Ambulatory Visit: Payer: Medicare HMO | Admitting: Physical Therapy

## 2022-06-06 ENCOUNTER — Encounter: Payer: Self-pay | Admitting: Physical Therapy

## 2022-06-06 DIAGNOSIS — R600 Localized edema: Secondary | ICD-10-CM

## 2022-06-06 DIAGNOSIS — M6281 Muscle weakness (generalized): Secondary | ICD-10-CM

## 2022-06-06 DIAGNOSIS — M25651 Stiffness of right hip, not elsewhere classified: Secondary | ICD-10-CM

## 2022-06-06 DIAGNOSIS — M25551 Pain in right hip: Secondary | ICD-10-CM | POA: Diagnosis not present

## 2022-06-06 NOTE — Therapy (Signed)
OUTPATIENT PHYSICAL THERAPY TREATMENT   Patient Name: Manuel SAUCEDA Sr. MRN: [id number] DOB:1941-02-12, 81 y.o., male Today's Date: 06/06/2022   PT End of Session - 06/06/22 0931     Visit Number 7    Number of Visits 16    Date for PT Re-Evaluation 07/05/22    Authorization Type [employer]    Progress Note Due on Visit 10    PT Start Time 0931    PT Stop Time 1015    PT Time Calculation (min) 44 min    Activity Tolerance Patient tolerated treatment well    Behavior During Therapy WFL for tasks assessed/performed                Past Medical History:  Diagnosis Date   Arthritis    Atrial fibrillation (HCC)    GERD (gastroesophageal reflux disease)    History of hiatal hernia    Hypertension    Lower extremity edema    Occasionally takes furosemide prn   Prostate cancer ([place]) 2007   tx with surgery   RLS (restless legs syndrome)    Rotator cuff tear    Right   Sleep apnea    CPAP nightly   Type 2 diabetes mellitus ([place])    Past Surgical History:  Procedure Laterality Date   FINGER ARTHROPLASTY Left 03/10/2017   Procedure: Left index finger metacarpal phalangeal arthroplasty;  Surgeon: Roseanne Kaufman, MD;  Location: [facility];  Service: Orthopedics;  Laterality: Left;  90 mins   JOINT REPLACEMENT     KNEE SURGERY Bilateral    left knee replacment  2009   MANDIBLE FRACTURE SURGERY     left 2 titanium plates and 10 scres   NECK SURGERY     cervical with bone graft   PROSTATE SURGERY     prostatctomy   REVERSE SHOULDER ARTHROPLASTY Left 02/14/2020   Procedure: REVERSE SHOULDER ARTHROPLASTY;  Surgeon: Netta Cedars, MD;  Location: [facility];  Service: Orthopedics;  Laterality: Left;  Interscalene block   ROTATOR CUFF REPAIR     x 2 right x 2 left   TOTAL HIP ARTHROPLASTY Left 09/13/2016   Procedure: LEFT TOTAL HIP ARTHROPLASTY ANTERIOR APPROACH;  Surgeon: [name], MD;  Location: [facility];  Service: Orthopedics;  Laterality:  Left;   TOTAL HIP ARTHROPLASTY Right 04/01/2022   Procedure: RIGHT TOTAL HIP ARTHROPLASTY ANTERIOR APPROACH;  Surgeon: [name], MD;  Location: [facility];  Service: Orthopedics;  Laterality: Right;   TOTAL KNEE ARTHROPLASTY Right 03/23/2018   Procedure: RIGHT TOTAL KNEE ARTHROPLASTY;  Surgeon: [name], MD;  Location: [facility];  Service: Orthopedics;  Laterality: Right;   Patient Active Problem List   Diagnosis Date Noted   Status post total replacement of right hip 04/01/2022   Unilateral primary osteoarthritis, right hip 12/27/2021   H/O total shoulder replacement, left 02/14/2020   Unilateral primary osteoarthritis, right knee 03/23/2018   Status post total knee replacement, right 03/23/2018   Unilateral primary osteoarthritis, left hip 09/13/2016   Status post left hip replacement 09/13/2016    PCP: [facility] [place], [place])  REFERRING PROVIDER: Jean Rosenthal MD  REFERRING DIAG: [phone] (ICD-10-CM) - Status post total replacement of right hip  THERAPY DIAG:  Pain in right hip  Localized edema  Muscle weakness (generalized)  Stiffness of right hip, not elsewhere classified  Rationale for Evaluation and Treatment Rehabilitation  ONSET DATE: 04/14/22 R THA anterior approach  SUBJECTIVE:   SUBJECTIVE STATEMENT: Reports  he will be seeing his PCP today to get his medication reconciled. Pt reports he was able to do more walking this weekend.   PERTINENT HISTORY: LTHA, bilat TKA, L TSA  PAIN:  Are you having pain? Yes: NPRS scale: 0/10 Pain location: R hip Pain description: soreness during car transfers Aggravating factors: none Relieving factors: none  PRECAUTIONS: None  WEIGHT BEARING RESTRICTIONS: No  FALLS:  Has patient fallen in last 6 months? No  PATIENT GOALS: Walking without pain   OBJECTIVE:  ____________________________________________________________________________________________ From initial  evaluation  LOWER EXTREMITY ROM:  Active ROM Right eval Left eval Right 11/2 Right 11/15  Hip flexion 100 110 110 110  Hip extension -20 10 0 20  Hip abduction 30     Hip adduction      Hip internal rotation      Hip external rotation      Knee flexion      Knee extension      Ankle dorsiflexion      Ankle plantarflexion      Ankle inversion      Ankle eversion       (Blank rows = not tested)  LOWER EXTREMITY MMT:  MMT Right eval Left eval Right 11/15  Hip flexion 4+ 5 5  Hip extension 4+ 5 5  Hip abduction 4 5 4+  Hip adduction 4 5   Hip internal rotation     Hip external rotation     Knee flexion _0 Knee extension 4+ 5 5  Ankle dorsiflexion     Ankle plantarflexion     Ankle inversion     Ankle eversion      (Blank rows = not tested)  FUNCTIONAL TESTS:   Eval: 5 times sit to stand: 19 sec Timed up and go (TUG): 13 sec  11/15 5 times sit to stand: 16 sec  [facility] Adult PT Treatment:                                                DATE: 06/01/22 Therapeutic Exercise: Treadmill 1.0 mph x 10 min  STANDING Forward step ups 2x 1 min bouts; leading with R and then L LE Lateral step up and overs 3x 1 min bouts Sled push/pull 4x15' Single leg stance 3x 1 min    [facility] Adult PT Treatment:                                                DATE: 06/01/22 Therapeutic Exercise: Nu step level 5 x 5 minutes SUPINE Marching 2x10 Hip flexor stretch 2x30 sec SKTC x30 sec  STANDING Amb x 10 min around building up/down hills -- multiple rest breaks required due to [place] monitored -- spO2 remained at 98%, HR at most 120 BPM; however, pt with history of a-fib and will be following up with cardiology  Therapeutic Activity:  Ascend 2 flights of stairs (~15 steps) with one rest break   [facility] Adult PT Treatment:  DATE: 05/30/22 Therapeutic Exercise: Nu step level 5 x 5 minutes SUPINE Piriformis stretch 2x30 sec  R Butterfly stretch 2x30 sec R Figure 4 stretch 2x30 sec R SITTING Hip flexion into side step 2x10 green TB  Hip IR 2x10 green TB Hip ER 2x10 green TB STANDING Hip flexor stretch 2x30 sec Hamstring curl 2x10 SLS with UE support 2x30 sec Runner's step up 2x10 Self Care: Self massage with massage stick      PATIENT EDUCATION:  Education details: Discussed initiating a walking program Person educated: Patient Education method: Explanation, Demonstration, and Handouts Education comprehension: verbalized understanding, returned demonstration, and needs further education  HOME EXERCISE PROGRAM: Access Code: [email or web address] URL: [email or web address] Date: 05/30/2022 Prepared by: Estill Bamberg April Thurnell Garbe  Exercises - Supine Figure 4 Piriformis Stretch  - 1 x daily - 7 x weekly - 2 sets - 30 sec hold - Supine Piriformis Stretch with Foot on Ground  - 1 x daily - 7 x weekly - 2 sets - 30 sec hold - Seated Hamstring Stretch  - 1 x daily - 7 x weekly - 2 sets - 30 sec hold - Standing Hamstring Stretch with Step  - 1 x daily - 7 x weekly - 2 sets - 30 sec hold - Hip Flexor Stretch on Step  - 1 x daily - 7 x weekly - 2 sets - 30 sec hold - Seated Hip Flexor Stretch  - 1 x daily - 7 x weekly - 2 sets - 30 sec hold - Supine Hip Flexion with Resistance  - 1 x daily - 7 x weekly - 2 sets - 10 reps - Standing Hip Abduction with Resistance at Ankles and Counter Support  - 1 x daily - 7 x weekly - 2 sets - 10 reps - Standing Hip Extension with Resistance at Ankles and Counter Support  - 1 x daily - 7 x weekly - 2 sets - 10 reps - Standing Hamstring Curl with Resistance  - 1 x daily - 7 x weekly - 2 sets - 10 reps - Seated Hip External Rotation with Resistance  - 1 x daily - 7 x weekly - 2 sets - 10 reps - Seated Hip Internal Rotation with Ball and Resistance  - 1 x daily - 7 x weekly - 2 sets - 10 reps  ASSESSMENT:  CLINICAL IMPRESSION: Session focused primarily on improving bilat  LE endurance. Sitting rest breaks in between as needed. HR up to 128 bpm.   OBJECTIVE IMPAIRMENTS: Abnormal gait, decreased activity tolerance, decreased balance, decreased endurance, decreased mobility, difficulty walking, decreased ROM, decreased strength, increased edema, increased fascial restrictions, increased muscle spasms, impaired flexibility, improper body mechanics, postural dysfunction, and pain.   GOALS: Goals reviewed with patient? Yes  SHORT TERM GOALS: Target date: 06/07/2022   Pt will be ind with initial HEP Goal status: MET  2.  Pt will have improved hip ext to at least neutral Baseline: 0 deg Goal status: MET  LONG TERM GOALS: Target date: 07/05/2022   Pt will be ind with progression and advancement of HEP Goal status:IN PROGRESS   2.  Pt will demo L = R hip ROM Goal status: MET   3.  Pt will improve 5 x STS to </=13 sec to demo improved functional LE strength Goal status:IN PROGRESS   4.  Pt will be able to amb ind >/=1000' for community level mobility Goal status: MET   5.  Pt will report </=2/10 pain with all activities Goal  status: MET  PLAN:  PT FREQUENCY: 2x/week  PT DURATION: 8 weeks  PLANNED INTERVENTIONS: Therapeutic exercises, Therapeutic activity, Neuromuscular re-education, Balance training, Gait training, Patient/Family education, Self Care, Joint mobilization, Aquatic Therapy, Dry Needling, Electrical stimulation, Cryotherapy, Moist heat, scar mobilization, Taping, Ultrasound, Ionotophoresis 69m/ml Dexamethasone, Manual therapy, and Re-evaluation  PLAN FOR NEXT SESSION: Assess response to HEP. Manual work/TPDN as indicated for R hip/thigh. ROM and strengthening for hip/knee.   Manuel Hunter, PT, DPT 06/06/2022, 9:31 AM

## 2022-06-08 ENCOUNTER — Encounter: Payer: Self-pay | Admitting: Physical Therapy

## 2022-06-08 ENCOUNTER — Ambulatory Visit: Payer: Medicare HMO | Admitting: Physical Therapy

## 2022-06-08 DIAGNOSIS — M25651 Stiffness of right hip, not elsewhere classified: Secondary | ICD-10-CM

## 2022-06-08 DIAGNOSIS — R600 Localized edema: Secondary | ICD-10-CM

## 2022-06-08 DIAGNOSIS — M6281 Muscle weakness (generalized): Secondary | ICD-10-CM

## 2022-06-08 DIAGNOSIS — M25551 Pain in right hip: Secondary | ICD-10-CM | POA: Diagnosis not present

## 2022-06-08 NOTE — Therapy (Signed)
OUTPATIENT PHYSICAL THERAPY TREATMENT AND DISCHARGE  PHYSICAL THERAPY DISCHARGE SUMMARY  Visits from Start of Care: 8  Current functional level related to goals / functional outcomes: See below   Remaining deficits: See below   Education / Equipment: See below   Patient agrees to discharge. Patient goals were met. Patient is being discharged due to meeting the stated rehab goals.    Patient Name: Manuel KILKER Sr. MRN: [id number] DOB:Oct 06, 1940, 81 y.o., male Today's Date: 06/08/2022   PT End of Session - 06/08/22 0923     Visit Number 8    Number of Visits 16    Date for PT Re-Evaluation 07/05/22    Authorization Type [employer]    Authorization - Visit Number 8    Progress Note Due on Visit 10    PT Start Time 0925    PT Stop Time 1000    PT Time Calculation (min) 35 min    Activity Tolerance Patient tolerated treatment well    Behavior During Therapy WFL for tasks assessed/performed             Past Medical History:  Diagnosis Date   Arthritis    Atrial fibrillation (HCC)    GERD (gastroesophageal reflux disease)    History of hiatal hernia    Hypertension    Lower extremity edema    Occasionally takes furosemide prn   Prostate cancer ([place]) 2007   tx with surgery   RLS (restless legs syndrome)    Rotator cuff tear    Right   Sleep apnea    CPAP nightly   Type 2 diabetes mellitus ([place])    Past Surgical History:  Procedure Laterality Date   FINGER ARTHROPLASTY Left 03/10/2017   Procedure: Left index finger metacarpal phalangeal arthroplasty;  Surgeon: Roseanne Kaufman, MD;  Location: [facility];  Service: Orthopedics;  Laterality: Left;  90 mins   JOINT REPLACEMENT     KNEE SURGERY Bilateral    left knee replacment  2009   MANDIBLE FRACTURE SURGERY     left 2 titanium plates and 10 scres   NECK SURGERY     cervical with bone graft   PROSTATE SURGERY     prostatctomy   REVERSE SHOULDER ARTHROPLASTY Left 02/14/2020    Procedure: REVERSE SHOULDER ARTHROPLASTY;  Surgeon: Netta Cedars, MD;  Location: [facility];  Service: Orthopedics;  Laterality: Left;  Interscalene block   ROTATOR CUFF REPAIR     x 2 right x 2 left   TOTAL HIP ARTHROPLASTY Left 09/13/2016   Procedure: LEFT TOTAL HIP ARTHROPLASTY ANTERIOR APPROACH;  Surgeon: [name], MD;  Location: [facility];  Service: Orthopedics;  Laterality: Left;   TOTAL HIP ARTHROPLASTY Right 04/01/2022   Procedure: RIGHT TOTAL HIP ARTHROPLASTY ANTERIOR APPROACH;  Surgeon: [name], MD;  [facility];  Service: Orthopedics;  Laterality: Right;   TOTAL KNEE ARTHROPLASTY Right 03/23/2018   Procedure: RIGHT TOTAL KNEE ARTHROPLASTY;  Surgeon: [name], MD;  Location: [facility];  Service: Orthopedics;  Laterality: Right;   Patient Active Problem List   Diagnosis Date Noted   Status post total replacement of right hip 04/01/2022   Unilateral primary osteoarthritis, right hip 12/27/2021   H/O total shoulder replacement, left 02/14/2020   Unilateral primary osteoarthritis, right knee 03/23/2018   Status post total knee replacement, right 03/23/2018   Unilateral primary osteoarthritis, left hip 09/13/2016   Status post left hip replacement 09/13/2016    PCP: [facility] [place]  [place], [place])  REFERRING PROVIDER: Jean Rosenthal MD  REFERRING DIAG: [phone] (ICD-10-CM) - Status post total replacement of right hip  THERAPY DIAG:  Pain in right hip  Localized edema  Muscle weakness (generalized)  Stiffness of right hip, not elsewhere classified  Rationale for Evaluation and Treatment Rehabilitation  ONSET DATE: 04/14/22 R THA anterior approach  SUBJECTIVE:   SUBJECTIVE STATEMENT: Pt states his doctor visit went well. Pt got his medications straightened out. Pt reports some soreness after last session. Feeling better today. Pt states he's been walking more outside.   PERTINENT HISTORY: LTHA, bilat TKA, L  TSA  PAIN:  Are you having pain? Yes: NPRS scale: 0/10 Pain location: R hip Pain description: soreness during car transfers Aggravating factors: none Relieving factors: none  PRECAUTIONS: None  WEIGHT BEARING RESTRICTIONS: No  FALLS:  Has patient fallen in last 6 months? No  PATIENT GOALS: Walking without pain   OBJECTIVE:  ____________________________________________________________________________________________ From initial evaluation  LOWER EXTREMITY ROM:  Active ROM Right eval Left eval Right 11/2 Right 11/15  Hip flexion 100 110 110 110  Hip extension -20 10 0 20  Hip abduction 30     Hip adduction      Hip internal rotation      Hip external rotation      Knee flexion      Knee extension      Ankle dorsiflexion      Ankle plantarflexion      Ankle inversion      Ankle eversion       (Blank rows = not tested)  LOWER EXTREMITY MMT:  MMT Right eval Left eval Right 11/15  Hip flexion 4+ 5 5  Hip extension 4+ 5 5  Hip abduction 4 5 4+  Hip adduction 4 5   Hip internal rotation     Hip external rotation     Knee flexion _0 Knee extension 4+ 5 5  Ankle dorsiflexion     Ankle plantarflexion     Ankle inversion     Ankle eversion      (Blank rows = not tested)  FUNCTIONAL TESTS:   Eval: 5 times sit to stand: 19 sec Timed up and go (TUG): 13 sec  11/15 5 times sit to stand: 16 sec  11/22 5 times sit to stand: 13 sec   [facility] Adult PT Treatment:                                                DATE: 06/08/22 Therapeutic Exercise: Treadmill 1.0 mph x 10 min  STANDING Sit<>stand 3x5 Forward step ups 2x 1 min bouts; leading with R and then L LE Single leg stance 2x 1 min with UE support Tandem stance 2x1 min   [facility] Adult PT Treatment:                                                DATE: 06/06/22 Therapeutic Exercise: Treadmill 1.0 mph x 10 min  STANDING Forward step ups 2x 1 min bouts; leading with R and then L LE Lateral step up  and overs 3x 1 min bouts Sled push/pull 4x15' Single leg stance 3x 1 min    [facility]  Adult PT Treatment:                                                DATE: 06/01/22 Therapeutic Exercise: Nu step level 5 x 5 minutes SUPINE Marching 2x10 Hip flexor stretch 2x30 sec SKTC x30 sec  STANDING Amb x 10 min around building up/down hills -- multiple rest breaks required due to [place] monitored -- spO2 remained at 98%, HR at most 120 BPM; however, pt with history of a-fib and will be following up with cardiology  Therapeutic Activity:  Ascend 2 flights of stairs (~15 steps) with one rest break   [facility] Adult PT Treatment:                                                DATE: 05/30/22 Therapeutic Exercise: Nu step level 5 x 5 minutes SUPINE Piriformis stretch 2x30 sec R Butterfly stretch 2x30 sec R Figure 4 stretch 2x30 sec R SITTING Hip flexion into side step 2x10 green TB  Hip IR 2x10 green TB Hip ER 2x10 green TB STANDING Hip flexor stretch 2x30 sec Hamstring curl 2x10 SLS with UE support 2x30 sec Runner's step up 2x10 Self Care: Self massage with massage stick      PATIENT EDUCATION:  Education details: Discussed initiating a walking program Person educated: Patient Education method: Explanation, Demonstration, and Handouts Education comprehension: verbalized understanding, returned demonstration, and needs further education  HOME EXERCISE PROGRAM: Access Code: [email or web address] URL: [email or web address] Date: 06/08/2022 Prepared by: Estill Bamberg April Thurnell Garbe  Exercises - Supine Figure 4 Piriformis Stretch  - 1 x daily - 7 x weekly - 2 sets - 30 sec hold - Supine Piriformis Stretch with Foot on Ground  - 1 x daily - 7 x weekly - 2 sets - 30 sec hold - Seated Hamstring Stretch  - 1 x daily - 7 x weekly - 2 sets - 30 sec hold - Standing Hamstring Stretch with Step  - 1 x daily - 7 x weekly - 2 sets - 30 sec hold - Hip Flexor Stretch on Step  - 1 x daily - 7 x  weekly - 2 sets - 30 sec hold - Seated Hip Flexor Stretch  - 1 x daily - 7 x weekly - 2 sets - 30 sec hold - Supine Hip Flexion with Resistance  - 1 x daily - 7 x weekly - 2 sets - 10 reps - Standing Hip Abduction with Resistance at Ankles and Counter Support  - 1 x daily - 7 x weekly - 2 sets - 10 reps - Standing Hip Extension with Resistance at Ankles and Counter Support  - 1 x daily - 7 x weekly - 2 sets - 10 reps - Standing Hamstring Curl with Resistance  - 1 x daily - 7 x weekly - 2 sets - 10 reps - Seated Hip External Rotation with Resistance  - 1 x daily - 7 x weekly - 2 sets - 10 reps - Seated Hip Internal Rotation with Ball and Resistance  - 1 x daily - 7 x weekly - 2 sets - 10 reps - Single Leg Stance  - 1 x daily - 7 x weekly - 2 sets - 10  sec hold - Tandem Stance  - 1 x daily - 7 x weekly - 2 sets - 30 sec hold - Step Up  - 1 x daily - 7 x weekly - 2 sets - 1 min hold  ASSESSMENT:  CLINICAL IMPRESSION: Session focused on rechecking pt's goals for D/C. Pt has met all of his LTGs at this time. Greatest difficulty is still SHOB with amb. Pt demonstrates good gait stability; however, is challenged with balance exercises. Pt is knowledgeable on how to work on balance and endurance/activity tolerance at home. Pt is ready for d/c.   OBJECTIVE IMPAIRMENTS: Abnormal gait, decreased activity tolerance, decreased balance, decreased endurance, decreased mobility, difficulty walking, decreased ROM, decreased strength, increased edema, increased fascial restrictions, increased muscle spasms, impaired flexibility, improper body mechanics, postural dysfunction, and pain.   GOALS: Goals reviewed with patient? Yes  SHORT TERM GOALS: Target date: 06/07/2022   Pt will be ind with initial HEP Goal status: MET  2.  Pt will have improved hip ext to at least neutral Baseline: 0 deg Goal status: MET  LONG TERM GOALS: Target date: 07/05/2022   Pt will be ind with progression and advancement of  HEP Goal status:MET   2.  Pt will demo L = R hip ROM Goal status: MET   3.  Pt will improve 5 x STS to </=13 sec to demo improved functional LE strength Goal status: MET   4.  Pt will be able to amb ind >/=1000' for community level mobility Goal status: MET   5.  Pt will report </=2/10 pain with all activities Goal status: MET  PLAN:  PT FREQUENCY: 2x/week  PT DURATION: 8 weeks  PLANNED INTERVENTIONS: Therapeutic exercises, Therapeutic activity, Neuromuscular re-education, Balance training, Gait training, Patient/Family education, Self Care, Joint mobilization, Aquatic Therapy, Dry Needling, Electrical stimulation, Cryotherapy, Moist heat, scar mobilization, Taping, Ultrasound, Ionotophoresis 35m/ml Dexamethasone, Manual therapy, and Re-evaluation  PLAN FOR NEXT SESSION: Assess response to HEP. Manual work/TPDN as indicated for R hip/thigh. ROM and strengthening for hip/knee.   Nazar Kuan April Ma L Ranger Petrich, PT, DPT 06/08/2022, 9:24 AM

## 2022-07-01 ENCOUNTER — Other Ambulatory Visit: Payer: Self-pay | Admitting: Orthopaedic Surgery

## 2022-09-22 ENCOUNTER — Encounter: Payer: Self-pay | Admitting: Radiology

## 2022-12-12 ENCOUNTER — Other Ambulatory Visit: Payer: Self-pay | Admitting: Orthopaedic Surgery

## 2022-12-23 ENCOUNTER — Other Ambulatory Visit: Payer: Self-pay | Admitting: Orthopaedic Surgery
